# Patient Record
Sex: Female | Born: 1955 | Race: White | Hispanic: No | Marital: Single | Smoking: Former smoker
Health system: Southern US, Community
[De-identification: ages and names within clinical notes are randomized; demographics above are authoritative.]

## PROBLEM LIST (undated history)

## (undated) DIAGNOSIS — E079 Disorder of thyroid, unspecified: Secondary | ICD-10-CM

## (undated) DIAGNOSIS — C4491 Basal cell carcinoma of skin, unspecified: Secondary | ICD-10-CM

## (undated) DIAGNOSIS — Z9189 Other specified personal risk factors, not elsewhere classified: Secondary | ICD-10-CM

## (undated) DIAGNOSIS — C50919 Malignant neoplasm of unspecified site of unspecified female breast: Secondary | ICD-10-CM

## (undated) DIAGNOSIS — R897 Abnormal histological findings in specimens from other organs, systems and tissues: Secondary | ICD-10-CM

## (undated) DIAGNOSIS — B977 Papillomavirus as the cause of diseases classified elsewhere: Secondary | ICD-10-CM

## (undated) DIAGNOSIS — F32A Depression, unspecified: Secondary | ICD-10-CM

## (undated) DIAGNOSIS — F329 Major depressive disorder, single episode, unspecified: Secondary | ICD-10-CM

## (undated) DIAGNOSIS — Z923 Personal history of irradiation: Secondary | ICD-10-CM

## (undated) DIAGNOSIS — Z9221 Personal history of antineoplastic chemotherapy: Secondary | ICD-10-CM

## (undated) DIAGNOSIS — Z87410 Personal history of cervical dysplasia: Secondary | ICD-10-CM

## (undated) HISTORY — DX: Major depressive disorder, single episode, unspecified: F32.9

## (undated) HISTORY — DX: Papillomavirus as the cause of diseases classified elsewhere: B97.7

## (undated) HISTORY — DX: Abnormal histological findings in specimens from other organs, systems and tissues: R89.7

## (undated) HISTORY — DX: Personal history of cervical dysplasia: Z87.410

## (undated) HISTORY — DX: Basal cell carcinoma of skin, unspecified: C44.91

## (undated) HISTORY — PX: BREAST BIOPSY: SHX20

## (undated) HISTORY — DX: Disorder of thyroid, unspecified: E07.9

## (undated) HISTORY — PX: NASAL SINUS SURGERY: SHX719

## (undated) HISTORY — DX: Malignant neoplasm of unspecified site of unspecified female breast: C50.919

## (undated) HISTORY — PX: MASTECTOMY: SHX3

## (undated) HISTORY — DX: Other specified personal risk factors, not elsewhere classified: Z91.89

## (undated) HISTORY — DX: Depression, unspecified: F32.A

## (undated) HISTORY — PX: BREAST SURGERY: SHX581

---

## 1999-03-26 ENCOUNTER — Other Ambulatory Visit: Admission: RE | Admit: 1999-03-26 | Discharge: 1999-03-26 | Payer: Self-pay | Admitting: *Deleted

## 1999-12-02 ENCOUNTER — Encounter: Admission: RE | Admit: 1999-12-02 | Discharge: 1999-12-02 | Payer: Self-pay | Admitting: *Deleted

## 1999-12-02 ENCOUNTER — Encounter: Payer: Self-pay | Admitting: *Deleted

## 2003-05-27 ENCOUNTER — Other Ambulatory Visit: Admission: RE | Admit: 2003-05-27 | Discharge: 2003-05-27 | Payer: Self-pay | Admitting: Internal Medicine

## 2003-06-06 ENCOUNTER — Encounter: Admission: RE | Admit: 2003-06-06 | Discharge: 2003-06-06 | Payer: Self-pay | Admitting: Internal Medicine

## 2004-06-07 ENCOUNTER — Encounter: Admission: RE | Admit: 2004-06-07 | Discharge: 2004-06-07 | Payer: Self-pay | Admitting: Obstetrics and Gynecology

## 2004-06-23 ENCOUNTER — Ambulatory Visit: Payer: Self-pay | Admitting: Internal Medicine

## 2004-07-13 ENCOUNTER — Ambulatory Visit: Payer: Self-pay | Admitting: Internal Medicine

## 2004-12-15 ENCOUNTER — Ambulatory Visit (HOSPITAL_BASED_OUTPATIENT_CLINIC_OR_DEPARTMENT_OTHER): Admission: RE | Admit: 2004-12-15 | Discharge: 2004-12-15 | Payer: Self-pay | Admitting: Plastic Surgery

## 2004-12-15 ENCOUNTER — Encounter (INDEPENDENT_AMBULATORY_CARE_PROVIDER_SITE_OTHER): Payer: Self-pay | Admitting: Specialist

## 2005-02-21 DIAGNOSIS — C50919 Malignant neoplasm of unspecified site of unspecified female breast: Secondary | ICD-10-CM

## 2005-02-21 HISTORY — PX: MASTECTOMY: SHX3

## 2005-02-21 HISTORY — PX: BREAST BIOPSY: SHX20

## 2005-02-21 HISTORY — DX: Malignant neoplasm of unspecified site of unspecified female breast: C50.919

## 2005-06-09 ENCOUNTER — Encounter: Admission: RE | Admit: 2005-06-09 | Discharge: 2005-06-09 | Payer: Self-pay | Admitting: Obstetrics and Gynecology

## 2005-10-31 ENCOUNTER — Encounter: Admission: RE | Admit: 2005-10-31 | Discharge: 2005-10-31 | Payer: Self-pay | Admitting: Obstetrics and Gynecology

## 2005-10-31 ENCOUNTER — Encounter (INDEPENDENT_AMBULATORY_CARE_PROVIDER_SITE_OTHER): Payer: Self-pay | Admitting: Diagnostic Radiology

## 2005-11-10 ENCOUNTER — Encounter: Admission: RE | Admit: 2005-11-10 | Discharge: 2005-11-10 | Payer: Self-pay | Admitting: Obstetrics and Gynecology

## 2005-11-14 ENCOUNTER — Encounter (INDEPENDENT_AMBULATORY_CARE_PROVIDER_SITE_OTHER): Payer: Self-pay | Admitting: *Deleted

## 2005-11-14 ENCOUNTER — Encounter: Admission: RE | Admit: 2005-11-14 | Discharge: 2005-11-14 | Payer: Self-pay | Admitting: Obstetrics and Gynecology

## 2005-11-18 ENCOUNTER — Ambulatory Visit: Payer: Self-pay | Admitting: Oncology

## 2005-11-24 ENCOUNTER — Ambulatory Visit (HOSPITAL_COMMUNITY): Admission: RE | Admit: 2005-11-24 | Discharge: 2005-11-24 | Payer: Self-pay | Admitting: Oncology

## 2005-11-29 ENCOUNTER — Ambulatory Visit (HOSPITAL_COMMUNITY): Admission: RE | Admit: 2005-11-29 | Discharge: 2005-11-29 | Payer: Self-pay | Admitting: Oncology

## 2005-12-20 LAB — CBC WITH DIFFERENTIAL/PLATELET
EOS%: 1.4 % (ref 0.0–7.0)
Eosinophils Absolute: 0.1 10*3/uL (ref 0.0–0.5)
LYMPH%: 17.3 % (ref 14.0–48.0)
MCH: 32.5 pg (ref 26.0–34.0)
MCHC: 34.3 g/dL (ref 32.0–36.0)
MCV: 94.8 fL (ref 81.0–101.0)
MONO%: 2.8 % (ref 0.0–13.0)
NEUT#: 6.5 10*3/uL (ref 1.5–6.5)
Platelets: 155 10*3/uL (ref 145–400)
RBC: 3.54 10*6/uL — ABNORMAL LOW (ref 3.70–5.32)
RDW: 12.5 % (ref 11.3–14.5)

## 2005-12-28 ENCOUNTER — Ambulatory Visit: Payer: Self-pay | Admitting: Oncology

## 2005-12-28 LAB — CBC WITH DIFFERENTIAL/PLATELET
BASO%: 0.2 % (ref 0.0–2.0)
LYMPH%: 14.5 % (ref 14.0–48.0)
MCHC: 34.4 g/dL (ref 32.0–36.0)
MONO#: 0.7 10*3/uL (ref 0.1–0.9)
MONO%: 4 % (ref 0.0–13.0)
Platelets: 203 10*3/uL (ref 145–400)
RBC: 3.64 10*6/uL — ABNORMAL LOW (ref 3.70–5.32)
RDW: 12.6 % (ref 11.3–14.5)
WBC: 18.1 10*3/uL — ABNORMAL HIGH (ref 3.9–10.0)

## 2006-01-02 LAB — CBC WITH DIFFERENTIAL/PLATELET
BASO%: 0.5 % (ref 0.0–2.0)
MCHC: 35 g/dL (ref 32.0–36.0)
MONO#: 0.5 10*3/uL (ref 0.1–0.9)
RBC: 3.59 10*6/uL — ABNORMAL LOW (ref 3.70–5.32)
WBC: 7 10*3/uL (ref 3.9–10.0)
lymph#: 1.5 10*3/uL (ref 0.9–3.3)

## 2006-01-03 ENCOUNTER — Ambulatory Visit (HOSPITAL_COMMUNITY): Admission: RE | Admit: 2006-01-03 | Discharge: 2006-01-03 | Payer: Self-pay | Admitting: General Surgery

## 2006-01-03 LAB — PROTHROMBIN TIME
INR: 1.2 (ref 0.0–1.5)
Prothrombin Time: 15.1 seconds (ref 11.6–15.2)

## 2006-01-03 LAB — APTT: aPTT: 36 seconds (ref 24–37)

## 2006-01-10 LAB — CBC WITH DIFFERENTIAL/PLATELET
BASO%: 1.4 % (ref 0.0–2.0)
LYMPH%: 16.2 % (ref 14.0–48.0)
MCHC: 34.6 g/dL (ref 32.0–36.0)
MONO#: 0.8 10*3/uL (ref 0.1–0.9)
RBC: 3.33 10*6/uL — ABNORMAL LOW (ref 3.70–5.32)
WBC: 10.1 10*3/uL — ABNORMAL HIGH (ref 3.9–10.0)
lymph#: 1.6 10*3/uL (ref 0.9–3.3)

## 2006-01-24 LAB — CBC WITH DIFFERENTIAL/PLATELET
BASO%: 0.3 % (ref 0.0–2.0)
HCT: 32.9 % — ABNORMAL LOW (ref 34.8–46.6)
LYMPH%: 16.8 % (ref 14.0–48.0)
MCH: 32.8 pg (ref 26.0–34.0)
MCHC: 34.6 g/dL (ref 32.0–36.0)
MCV: 94.7 fL (ref 81.0–101.0)
MONO#: 0.5 10*3/uL (ref 0.1–0.9)
MONO%: 7.2 % (ref 0.0–13.0)
NEUT%: 75.5 % (ref 39.6–76.8)
Platelets: 280 10*3/uL (ref 145–400)
WBC: 7.6 10*3/uL (ref 3.9–10.0)

## 2006-02-01 LAB — CBC WITH DIFFERENTIAL/PLATELET
BASO%: 0 % (ref 0.0–2.0)
EOS%: 0.2 % (ref 0.0–7.0)
HCT: 31.1 % — ABNORMAL LOW (ref 34.8–46.6)
LYMPH%: 7.6 % — ABNORMAL LOW (ref 14.0–48.0)
MCH: 32.7 pg (ref 26.0–34.0)
MCHC: 34.3 g/dL (ref 32.0–36.0)
NEUT%: 89.8 % — ABNORMAL HIGH (ref 39.6–76.8)
lymph#: 2.3 10*3/uL (ref 0.9–3.3)

## 2006-02-08 ENCOUNTER — Ambulatory Visit: Payer: Self-pay | Admitting: Oncology

## 2006-02-13 LAB — CBC WITH DIFFERENTIAL/PLATELET
EOS%: 0.1 % (ref 0.0–7.0)
Eosinophils Absolute: 0 10*3/uL (ref 0.0–0.5)
LYMPH%: 9.6 % — ABNORMAL LOW (ref 14.0–48.0)
MCH: 32.3 pg (ref 26.0–34.0)
MCHC: 34.1 g/dL (ref 32.0–36.0)
MCV: 94.6 fL (ref 81.0–101.0)
MONO%: 6.5 % (ref 0.0–13.0)
NEUT#: 11.2 10*3/uL — ABNORMAL HIGH (ref 1.5–6.5)
Platelets: 270 10*3/uL (ref 145–400)
RBC: 3.39 10*6/uL — ABNORMAL LOW (ref 3.70–5.32)

## 2006-02-23 ENCOUNTER — Encounter: Admission: RE | Admit: 2006-02-23 | Discharge: 2006-02-23 | Payer: Self-pay | Admitting: Oncology

## 2006-02-27 LAB — CBC WITH DIFFERENTIAL/PLATELET
BASO%: 0.1 % (ref 0.0–2.0)
LYMPH%: 8.3 % — ABNORMAL LOW (ref 14.0–48.0)
MCHC: 33.8 g/dL (ref 32.0–36.0)
MONO#: 0.9 10*3/uL (ref 0.1–0.9)
MONO%: 3.7 % (ref 0.0–13.0)
Platelets: 241 10*3/uL (ref 145–400)
RBC: 3.55 10*6/uL — ABNORMAL LOW (ref 3.70–5.32)
RDW: 14.1 % (ref 11.3–14.5)
WBC: 25.5 10*3/uL — ABNORMAL HIGH (ref 3.9–10.0)

## 2006-03-07 LAB — CBC WITH DIFFERENTIAL/PLATELET
BASO%: 0.1 % (ref 0.0–2.0)
HCT: 32.9 % — ABNORMAL LOW (ref 34.8–46.6)
MCHC: 33.8 g/dL (ref 32.0–36.0)
MONO#: 0.9 10*3/uL (ref 0.1–0.9)
NEUT#: 9.9 10*3/uL — ABNORMAL HIGH (ref 1.5–6.5)
RBC: 3.49 10*6/uL — ABNORMAL LOW (ref 3.70–5.32)
WBC: 12.8 10*3/uL — ABNORMAL HIGH (ref 3.9–10.0)
lymph#: 1.9 10*3/uL (ref 0.9–3.3)

## 2006-03-15 LAB — CBC WITH DIFFERENTIAL/PLATELET
BASO%: 0.4 % (ref 0.0–2.0)
Basophils Absolute: 0 10*3/uL (ref 0.0–0.1)
Eosinophils Absolute: 0.1 10*3/uL (ref 0.0–0.5)
HCT: 30.7 % — ABNORMAL LOW (ref 34.8–46.6)
HGB: 10.4 g/dL — ABNORMAL LOW (ref 11.6–15.9)
MONO#: 0.1 10*3/uL (ref 0.1–0.9)
NEUT%: 70.7 % (ref 39.6–76.8)
WBC: 3.9 10*3/uL (ref 3.9–10.0)
lymph#: 0.9 10*3/uL (ref 0.9–3.3)

## 2006-03-21 ENCOUNTER — Ambulatory Visit: Payer: Self-pay | Admitting: Oncology

## 2006-03-21 LAB — CBC WITH DIFFERENTIAL/PLATELET
Basophils Absolute: 0.1 10*3/uL (ref 0.0–0.1)
EOS%: 1.1 % (ref 0.0–7.0)
HCT: 35.7 % (ref 34.8–46.6)
HGB: 12 g/dL (ref 11.6–15.9)
MCH: 31.7 pg (ref 26.0–34.0)
MCV: 94.4 fL (ref 81.0–101.0)
NEUT%: 75.8 % (ref 39.6–76.8)
lymph#: 1.9 10*3/uL (ref 0.9–3.3)

## 2006-03-29 LAB — CBC WITH DIFFERENTIAL/PLATELET
BASO%: 0.7 % (ref 0.0–2.0)
LYMPH%: 16.8 % (ref 14.0–48.0)
MCH: 32.5 pg (ref 26.0–34.0)
MCHC: 35 g/dL (ref 32.0–36.0)
MCV: 93.1 fL (ref 81.0–101.0)
MONO%: 3.2 % (ref 0.0–13.0)
NEUT%: 79.2 % — ABNORMAL HIGH (ref 39.6–76.8)
Platelets: 156 10*3/uL (ref 145–400)
RBC: 3.02 10*6/uL — ABNORMAL LOW (ref 3.70–5.32)

## 2006-04-04 LAB — CBC WITH DIFFERENTIAL/PLATELET
BASO%: 0.2 % (ref 0.0–2.0)
EOS%: 0.1 % (ref 0.0–7.0)
LYMPH%: 11.1 % — ABNORMAL LOW (ref 14.0–48.0)
MCH: 32.5 pg (ref 26.0–34.0)
MCHC: 35.1 g/dL (ref 32.0–36.0)
MONO#: 0.8 10*3/uL (ref 0.1–0.9)
RBC: 3.49 10*6/uL — ABNORMAL LOW (ref 3.70–5.32)
WBC: 16.4 10*3/uL — ABNORMAL HIGH (ref 3.9–10.0)
lymph#: 1.8 10*3/uL (ref 0.9–3.3)

## 2006-04-10 ENCOUNTER — Encounter: Admission: RE | Admit: 2006-04-10 | Discharge: 2006-04-10 | Payer: Self-pay | Admitting: Oncology

## 2006-04-12 LAB — CBC WITH DIFFERENTIAL/PLATELET
BASO%: 0.5 % (ref 0.0–2.0)
EOS%: 0.1 % (ref 0.0–7.0)
HCT: 28.4 % — ABNORMAL LOW (ref 34.8–46.6)
HGB: 10.2 g/dL — ABNORMAL LOW (ref 11.6–15.9)
MCHC: 35.9 g/dL (ref 32.0–36.0)
MONO#: 0.3 10*3/uL (ref 0.1–0.9)
NEUT%: 85.5 % — ABNORMAL HIGH (ref 39.6–76.8)
RDW: 13 % (ref 11.3–14.5)
WBC: 9.2 10*3/uL (ref 3.9–10.0)
lymph#: 1 10*3/uL (ref 0.9–3.3)

## 2006-04-18 LAB — CBC WITH DIFFERENTIAL/PLATELET
Basophils Absolute: 0 10*3/uL (ref 0.0–0.1)
EOS%: 0.1 % (ref 0.0–7.0)
Eosinophils Absolute: 0 10*3/uL (ref 0.0–0.5)
HCT: 34.7 % — ABNORMAL LOW (ref 34.8–46.6)
HGB: 11.9 g/dL (ref 11.6–15.9)
MCH: 32.1 pg (ref 26.0–34.0)
MONO#: 1 10*3/uL — ABNORMAL HIGH (ref 0.1–0.9)
NEUT#: 14.2 10*3/uL — ABNORMAL HIGH (ref 1.5–6.5)
NEUT%: 84.7 % — ABNORMAL HIGH (ref 39.6–76.8)
RDW: 16.8 % — ABNORMAL HIGH (ref 11.3–14.5)
lymph#: 1.6 10*3/uL (ref 0.9–3.3)

## 2006-04-26 LAB — CBC WITH DIFFERENTIAL/PLATELET
Basophils Absolute: 0 10*3/uL (ref 0.0–0.1)
EOS%: 0.1 % (ref 0.0–7.0)
Eosinophils Absolute: 0 10*3/uL (ref 0.0–0.5)
HCT: 30.6 % — ABNORMAL LOW (ref 34.8–46.6)
HGB: 10.6 g/dL — ABNORMAL LOW (ref 11.6–15.9)
MCH: 31.9 pg (ref 26.0–34.0)
MCV: 91.9 fL (ref 81.0–101.0)
MONO%: 2.2 % (ref 0.0–13.0)
NEUT#: 5.6 10*3/uL (ref 1.5–6.5)
NEUT%: 87.2 % — ABNORMAL HIGH (ref 39.6–76.8)
Platelets: 133 10*3/uL — ABNORMAL LOW (ref 145–400)
RDW: 16.1 % — ABNORMAL HIGH (ref 11.3–14.5)

## 2006-05-22 ENCOUNTER — Encounter (INDEPENDENT_AMBULATORY_CARE_PROVIDER_SITE_OTHER): Payer: Self-pay | Admitting: Specialist

## 2006-05-22 ENCOUNTER — Ambulatory Visit (HOSPITAL_COMMUNITY): Admission: RE | Admit: 2006-05-22 | Discharge: 2006-05-23 | Payer: Self-pay | Admitting: General Surgery

## 2006-05-25 ENCOUNTER — Ambulatory Visit: Payer: Self-pay | Admitting: Oncology

## 2006-05-30 LAB — COMPREHENSIVE METABOLIC PANEL
ALT: 37 U/L — ABNORMAL HIGH (ref 0–35)
AST: 34 U/L (ref 0–37)
Calcium: 9.2 mg/dL (ref 8.4–10.5)
Chloride: 104 mEq/L (ref 96–112)
Creatinine, Ser: 0.56 mg/dL (ref 0.40–1.20)
Potassium: 4.2 mEq/L (ref 3.5–5.3)
Sodium: 141 mEq/L (ref 135–145)
Total Protein: 6.4 g/dL (ref 6.0–8.3)

## 2006-05-30 LAB — CBC WITH DIFFERENTIAL/PLATELET
Basophils Absolute: 0 10*3/uL (ref 0.0–0.1)
EOS%: 2.9 % (ref 0.0–7.0)
Eosinophils Absolute: 0.1 10*3/uL (ref 0.0–0.5)
LYMPH%: 21.2 % (ref 14.0–48.0)
MCH: 32.4 pg (ref 26.0–34.0)
MCV: 91.4 fL (ref 81.0–101.0)
MONO%: 7.9 % (ref 0.0–13.0)
NEUT#: 3 10*3/uL (ref 1.5–6.5)
Platelets: 168 10*3/uL (ref 145–400)
RBC: 3.89 10*6/uL (ref 3.70–5.32)
RDW: 15.3 % — ABNORMAL HIGH (ref 11.3–14.5)

## 2006-06-09 LAB — ESTRADIOL, ULTRA SENS: Estradiol, Ultra Sensitive: <2 pg/mL

## 2006-06-13 ENCOUNTER — Ambulatory Visit: Admission: RE | Admit: 2006-06-13 | Discharge: 2006-08-18 | Payer: Self-pay | Admitting: Radiation Oncology

## 2006-07-25 ENCOUNTER — Ambulatory Visit: Payer: Self-pay | Admitting: Oncology

## 2006-10-15 ENCOUNTER — Ambulatory Visit: Payer: Self-pay | Admitting: Oncology

## 2006-11-18 ENCOUNTER — Ambulatory Visit (HOSPITAL_COMMUNITY): Admission: RE | Admit: 2006-11-18 | Discharge: 2006-11-18 | Payer: Self-pay | Admitting: Family Medicine

## 2006-11-18 ENCOUNTER — Ambulatory Visit: Payer: Self-pay | Admitting: Family Medicine

## 2006-12-06 ENCOUNTER — Ambulatory Visit: Payer: Self-pay | Admitting: Oncology

## 2007-01-02 ENCOUNTER — Encounter: Payer: Self-pay | Admitting: Internal Medicine

## 2007-01-05 LAB — ESTRADIOL, ULTRA SENS: Estradiol, Ultra Sensitive: 6 pg/mL

## 2007-01-11 ENCOUNTER — Encounter: Payer: Self-pay | Admitting: Internal Medicine

## 2007-01-22 ENCOUNTER — Encounter: Admission: RE | Admit: 2007-01-22 | Discharge: 2007-01-22 | Payer: Self-pay | Admitting: Oncology

## 2007-02-27 ENCOUNTER — Encounter: Admission: RE | Admit: 2007-02-27 | Discharge: 2007-02-27 | Payer: Self-pay | Admitting: Oncology

## 2007-03-30 ENCOUNTER — Ambulatory Visit: Payer: Self-pay | Admitting: Oncology

## 2007-04-03 LAB — CBC WITH DIFFERENTIAL/PLATELET
Basophils Absolute: 0 10*3/uL (ref 0.0–0.1)
Eosinophils Absolute: 0.1 10*3/uL (ref 0.0–0.5)
HGB: 12.8 g/dL (ref 11.6–15.9)
MONO#: 0.4 10*3/uL (ref 0.1–0.9)
NEUT#: 4 10*3/uL (ref 1.5–6.5)
RBC: 3.93 10*6/uL (ref 3.70–5.32)
RDW: 12.1 % (ref 11.3–14.5)
WBC: 5.2 10*3/uL (ref 3.9–10.0)
lymph#: 0.7 10*3/uL — ABNORMAL LOW (ref 0.9–3.3)

## 2007-04-04 LAB — COMPREHENSIVE METABOLIC PANEL
AST: 23 U/L (ref 0–37)
Albumin: 4.4 g/dL (ref 3.5–5.2)
BUN: 19 mg/dL (ref 6–23)
Calcium: 9.6 mg/dL (ref 8.4–10.5)
Chloride: 103 mEq/L (ref 96–112)
Glucose, Bld: 84 mg/dL (ref 70–99)
Potassium: 4.5 mEq/L (ref 3.5–5.3)
Sodium: 140 mEq/L (ref 135–145)
Total Protein: 6.6 g/dL (ref 6.0–8.3)

## 2007-04-10 ENCOUNTER — Encounter: Payer: Self-pay | Admitting: Internal Medicine

## 2007-04-13 ENCOUNTER — Encounter: Admission: RE | Admit: 2007-04-13 | Discharge: 2007-04-13 | Payer: Self-pay | Admitting: Oncology

## 2007-04-19 ENCOUNTER — Encounter: Payer: Self-pay | Admitting: *Deleted

## 2007-04-19 DIAGNOSIS — Z87410 Personal history of cervical dysplasia: Secondary | ICD-10-CM | POA: Insufficient documentation

## 2007-04-19 DIAGNOSIS — Z9189 Other specified personal risk factors, not elsewhere classified: Secondary | ICD-10-CM | POA: Insufficient documentation

## 2007-04-19 DIAGNOSIS — Z9010 Acquired absence of unspecified breast and nipple: Secondary | ICD-10-CM | POA: Insufficient documentation

## 2007-04-19 DIAGNOSIS — B977 Papillomavirus as the cause of diseases classified elsewhere: Secondary | ICD-10-CM | POA: Insufficient documentation

## 2007-04-19 DIAGNOSIS — C50919 Malignant neoplasm of unspecified site of unspecified female breast: Secondary | ICD-10-CM | POA: Insufficient documentation

## 2007-04-20 ENCOUNTER — Ambulatory Visit: Payer: Self-pay | Admitting: Internal Medicine

## 2007-04-20 DIAGNOSIS — R5381 Other malaise: Secondary | ICD-10-CM | POA: Insufficient documentation

## 2007-04-20 DIAGNOSIS — R5383 Other fatigue: Secondary | ICD-10-CM

## 2007-05-30 ENCOUNTER — Telehealth: Payer: Self-pay | Admitting: Internal Medicine

## 2007-05-31 ENCOUNTER — Ambulatory Visit: Payer: Self-pay | Admitting: Internal Medicine

## 2007-05-31 LAB — CONVERTED CEMR LAB
Anti Nuclear Antibody(ANA): NEGATIVE
BUN: 13 mg/dL (ref 6–23)
Creatinine, Ser: 0.6 mg/dL (ref 0.4–1.2)
GFR calc Af Amer: 137 mL/min
GFR calc non Af Amer: 113 mL/min
Potassium: 4.4 meq/L (ref 3.5–5.1)

## 2007-06-01 ENCOUNTER — Encounter: Payer: Self-pay | Admitting: Internal Medicine

## 2007-06-04 ENCOUNTER — Ambulatory Visit: Payer: Self-pay | Admitting: Internal Medicine

## 2007-06-28 ENCOUNTER — Ambulatory Visit: Payer: Self-pay | Admitting: Oncology

## 2007-07-02 LAB — COMPREHENSIVE METABOLIC PANEL
AST: 20 U/L (ref 0–37)
BUN: 17 mg/dL (ref 6–23)
CO2: 28 mEq/L (ref 19–32)
Calcium: 9.7 mg/dL (ref 8.4–10.5)
Chloride: 103 mEq/L (ref 96–112)
Creatinine, Ser: 0.67 mg/dL (ref 0.40–1.20)
Glucose, Bld: 69 mg/dL — ABNORMAL LOW (ref 70–99)

## 2007-07-02 LAB — CANCER ANTIGEN 27.29: CA 27.29: 19 U/mL (ref 0–39)

## 2007-07-02 LAB — CBC WITH DIFFERENTIAL/PLATELET
Basophils Absolute: 0 10*3/uL (ref 0.0–0.1)
EOS%: 2.9 % (ref 0.0–7.0)
Eosinophils Absolute: 0.1 10*3/uL (ref 0.0–0.5)
HCT: 37.4 % (ref 34.8–46.6)
HGB: 13.3 g/dL (ref 11.6–15.9)
MCH: 32.5 pg (ref 26.0–34.0)
NEUT#: 3.4 10*3/uL (ref 1.5–6.5)
NEUT%: 70.5 % (ref 39.6–76.8)
lymph#: 0.9 10*3/uL (ref 0.9–3.3)

## 2007-07-09 ENCOUNTER — Encounter: Payer: Self-pay | Admitting: Internal Medicine

## 2007-08-14 ENCOUNTER — Ambulatory Visit: Payer: Self-pay | Admitting: Oncology

## 2007-08-14 ENCOUNTER — Encounter: Payer: Self-pay | Admitting: Internal Medicine

## 2007-09-21 ENCOUNTER — Ambulatory Visit: Payer: Self-pay | Admitting: Internal Medicine

## 2007-09-21 ENCOUNTER — Telehealth: Payer: Self-pay | Admitting: Internal Medicine

## 2007-09-21 DIAGNOSIS — R079 Chest pain, unspecified: Secondary | ICD-10-CM | POA: Insufficient documentation

## 2007-10-02 ENCOUNTER — Ambulatory Visit: Payer: Self-pay | Admitting: Oncology

## 2007-10-08 ENCOUNTER — Encounter: Payer: Self-pay | Admitting: Internal Medicine

## 2007-12-20 ENCOUNTER — Encounter: Payer: Self-pay | Admitting: Internal Medicine

## 2008-02-28 ENCOUNTER — Encounter: Admission: RE | Admit: 2008-02-28 | Discharge: 2008-02-28 | Payer: Self-pay | Admitting: Oncology

## 2008-04-08 ENCOUNTER — Ambulatory Visit: Payer: Self-pay | Admitting: Oncology

## 2008-04-10 ENCOUNTER — Ambulatory Visit (HOSPITAL_COMMUNITY): Admission: RE | Admit: 2008-04-10 | Discharge: 2008-04-10 | Payer: Self-pay | Admitting: Oncology

## 2008-04-10 LAB — COMPREHENSIVE METABOLIC PANEL
ALT: 19 U/L (ref 0–35)
BUN: 13 mg/dL (ref 6–23)
CO2: 28 mEq/L (ref 19–32)
Calcium: 9.4 mg/dL (ref 8.4–10.5)
Chloride: 108 mEq/L (ref 96–112)
Creatinine, Ser: 0.51 mg/dL (ref 0.40–1.20)

## 2008-04-10 LAB — CBC WITH DIFFERENTIAL/PLATELET
BASO%: 0.5 % (ref 0.0–2.0)
Eosinophils Absolute: 0.3 10*3/uL (ref 0.0–0.5)
MCHC: 34.5 g/dL (ref 32.0–36.0)
MONO#: 0.3 10*3/uL (ref 0.1–0.9)
NEUT#: 1.9 10*3/uL (ref 1.5–6.5)
Platelets: 154 10*3/uL (ref 145–400)
RBC: 4.24 10*6/uL (ref 3.70–5.32)
RDW: 12.5 % (ref 11.3–14.5)
WBC: 3.6 10*3/uL — ABNORMAL LOW (ref 3.9–10.0)
lymph#: 1.1 10*3/uL (ref 0.9–3.3)

## 2008-04-10 LAB — FOLLICLE STIMULATING HORMONE: FSH: 62.5 m[IU]/mL

## 2008-04-10 LAB — CANCER ANTIGEN 27.29: CA 27.29: 18 U/mL (ref 0–39)

## 2008-04-17 ENCOUNTER — Encounter: Payer: Self-pay | Admitting: Internal Medicine

## 2008-04-22 LAB — ESTRADIOL, ULTRA SENS

## 2008-12-23 ENCOUNTER — Ambulatory Visit: Payer: Self-pay | Admitting: Oncology

## 2008-12-25 LAB — CBC WITH DIFFERENTIAL/PLATELET
Eosinophils Absolute: 0.4 10*3/uL (ref 0.0–0.5)
HCT: 36.5 % (ref 34.8–46.6)
LYMPH%: 23.3 % (ref 14.0–49.7)
MCHC: 34.8 g/dL (ref 31.5–36.0)
MCV: 95 fL (ref 79.5–101.0)
MONO#: 0.4 10*3/uL (ref 0.1–0.9)
MONO%: 7.3 % (ref 0.0–14.0)
NEUT#: 3.5 10*3/uL (ref 1.5–6.5)
NEUT%: 61.8 % (ref 38.4–76.8)
Platelets: 181 10*3/uL (ref 145–400)
WBC: 5.6 10*3/uL (ref 3.9–10.3)

## 2008-12-26 LAB — COMPREHENSIVE METABOLIC PANEL
Alkaline Phosphatase: 50 U/L (ref 39–117)
BUN: 21 mg/dL (ref 6–23)
CO2: 28 mEq/L (ref 19–32)
Creatinine, Ser: 0.64 mg/dL (ref 0.40–1.20)
Glucose, Bld: 76 mg/dL (ref 70–99)
Total Bilirubin: 0.4 mg/dL (ref 0.3–1.2)

## 2008-12-26 LAB — VITAMIN D 25 HYDROXY (VIT D DEFICIENCY, FRACTURES): Vit D, 25-Hydroxy: 39 ng/mL (ref 30–89)

## 2008-12-26 LAB — LUTEINIZING HORMONE: LH: 32.3 m[IU]/mL

## 2009-01-01 ENCOUNTER — Encounter: Payer: Self-pay | Admitting: Internal Medicine

## 2009-01-05 ENCOUNTER — Ambulatory Visit (HOSPITAL_COMMUNITY): Admission: RE | Admit: 2009-01-05 | Discharge: 2009-01-05 | Payer: Self-pay | Admitting: Oncology

## 2009-01-05 LAB — ESTRADIOL, ULTRA SENS: Estradiol, Ultra Sensitive: <2 pg/mL

## 2009-01-22 ENCOUNTER — Encounter: Admission: RE | Admit: 2009-01-22 | Discharge: 2009-01-22 | Payer: Self-pay | Admitting: Oncology

## 2009-03-02 ENCOUNTER — Encounter: Admission: RE | Admit: 2009-03-02 | Discharge: 2009-03-02 | Payer: Self-pay | Admitting: Oncology

## 2009-07-06 ENCOUNTER — Ambulatory Visit: Payer: Self-pay | Admitting: Internal Medicine

## 2009-07-06 LAB — CONVERTED CEMR LAB
AST: 27 units/L (ref 0–37)
Albumin: 3.9 g/dL (ref 3.5–5.2)
Alkaline Phosphatase: 41 units/L (ref 39–117)
BUN: 24 mg/dL — ABNORMAL HIGH (ref 6–23)
Basophils Absolute: 0 10*3/uL (ref 0.0–0.1)
Basophils Relative: 0.4 % (ref 0.0–3.0)
Bilirubin, Direct: 0.1 mg/dL (ref 0.0–0.3)
CO2: 31 meq/L (ref 19–32)
Chloride: 106 meq/L (ref 96–112)
Creatinine, Ser: 0.5 mg/dL (ref 0.4–1.2)
GFR calc non Af Amer: 155.79 mL/min (ref >60)
Glucose, Bld: 79 mg/dL (ref 70–99)
HCT: 36 % (ref 36.0–46.0)
Hemoglobin: 12.6 g/dL (ref 12.0–15.0)
Ketones, ur: NEGATIVE mg/dL
LDL Cholesterol: 94 mg/dL (ref 0–99)
Lymphocytes Relative: 30.4 % (ref 12.0–46.0)
Monocytes Relative: 10.3 % (ref 3.0–12.0)
Neutro Abs: 2.2 10*3/uL (ref 1.4–7.7)
Nitrite: NEGATIVE
Platelets: 162 10*3/uL (ref 150.0–400.0)
RBC: 3.82 M/uL — ABNORMAL LOW (ref 3.87–5.11)
Specific Gravity, Urine: 1.02 (ref 1.000–1.030)
Total CHOL/HDL Ratio: 3
Total Protein, Urine: NEGATIVE mg/dL
Total Protein: 6.1 g/dL (ref 6.0–8.3)
Triglycerides: 46 mg/dL (ref 0.0–149.0)
Urine Glucose: NEGATIVE mg/dL
Urobilinogen, UA: 0.2 (ref 0.0–1.0)
WBC: 4.3 10*3/uL — ABNORMAL LOW (ref 4.5–10.5)
pH: 6 (ref 5.0–8.0)

## 2009-07-10 ENCOUNTER — Ambulatory Visit: Payer: Self-pay | Admitting: Internal Medicine

## 2009-07-16 ENCOUNTER — Ambulatory Visit: Payer: Self-pay | Admitting: Internal Medicine

## 2009-07-16 ENCOUNTER — Telehealth: Payer: Self-pay | Admitting: Internal Medicine

## 2009-07-21 ENCOUNTER — Encounter: Payer: Self-pay | Admitting: Internal Medicine

## 2009-08-03 ENCOUNTER — Encounter: Admission: RE | Admit: 2009-08-03 | Discharge: 2009-08-03 | Payer: Self-pay | Admitting: General Surgery

## 2009-09-15 ENCOUNTER — Encounter (INDEPENDENT_AMBULATORY_CARE_PROVIDER_SITE_OTHER): Payer: Self-pay | Admitting: *Deleted

## 2009-10-23 ENCOUNTER — Encounter (INDEPENDENT_AMBULATORY_CARE_PROVIDER_SITE_OTHER): Payer: Self-pay

## 2009-10-27 ENCOUNTER — Ambulatory Visit: Payer: Self-pay | Admitting: Internal Medicine

## 2009-11-10 ENCOUNTER — Ambulatory Visit: Payer: Self-pay | Admitting: Internal Medicine

## 2009-11-10 HISTORY — PX: COLONOSCOPY: SHX174

## 2009-12-25 ENCOUNTER — Ambulatory Visit: Payer: Self-pay | Admitting: Oncology

## 2009-12-29 LAB — CBC WITH DIFFERENTIAL/PLATELET
Basophils Absolute: 0 10*3/uL (ref 0.0–0.1)
Eosinophils Absolute: 0.2 10*3/uL (ref 0.0–0.5)
HCT: 35.6 % (ref 34.8–46.6)
LYMPH%: 29.3 % (ref 14.0–49.7)
MCV: 93.8 fL (ref 79.5–101.0)
MONO%: 6.7 % (ref 0.0–14.0)
NEUT#: 3 10*3/uL (ref 1.5–6.5)
NEUT%: 59.3 % (ref 38.4–76.8)
Platelets: 194 10*3/uL (ref 145–400)
RBC: 3.79 10*6/uL (ref 3.70–5.45)

## 2009-12-30 LAB — COMPREHENSIVE METABOLIC PANEL
Albumin: 4.8 g/dL (ref 3.5–5.2)
Alkaline Phosphatase: 45 U/L (ref 39–117)
BUN: 16 mg/dL (ref 6–23)
Calcium: 9.4 mg/dL (ref 8.4–10.5)
Chloride: 100 mEq/L (ref 96–112)
Creatinine, Ser: 0.58 mg/dL (ref 0.40–1.20)
Glucose, Bld: 100 mg/dL — ABNORMAL HIGH (ref 70–99)
Potassium: 4 mEq/L (ref 3.5–5.3)

## 2009-12-30 LAB — VITAMIN D 25 HYDROXY (VIT D DEFICIENCY, FRACTURES): Vit D, 25-Hydroxy: 50 ng/mL (ref 30–89)

## 2010-01-05 ENCOUNTER — Encounter: Payer: Self-pay | Admitting: Internal Medicine

## 2010-03-13 ENCOUNTER — Encounter: Payer: Self-pay | Admitting: Oncology

## 2010-03-14 ENCOUNTER — Encounter: Payer: Self-pay | Admitting: Oncology

## 2010-03-25 NOTE — Letter (Signed)
Summary: [facility]   Imported By: Lester Atlanta 08/04/2009 11:55:19  _____________________________________________________________________  External Attachment:    Type:   Image     Comment:   External Document

## 2010-03-25 NOTE — Progress Notes (Signed)
Summary: OV TODAY  Phone Note Call from Patient   Summary of Call: Pt c/o increasing low back & pelvic pain w/pain radiating down left leg. Trouble bending and pain when walking. No injuries and no relief w/otc aleve. Scheduled for office visit today. Initial call taken by: Lamar Sprinkles, CMA,  Jul 16, 2009 10:51 AM  Follow-up for Phone Call        seen Follow-up by: Jacques Navy MD,  Jul 16, 2009 6:13 PM

## 2010-03-25 NOTE — Letter (Signed)
Summary: [facility]  [facility]   Imported By: Sherian Rein 02/10/2010 07:28:15  _____________________________________________________________________  External Attachment:    Type:   Image     Comment:   External Document

## 2010-03-25 NOTE — Letter (Signed)
Summary: [facility] Instructions  [facility]  [address] [place], [place] [address]   Phone: [phone]  Fax: [phone]       Kathryn Mcdaniel    1955-07-22    MRN: [id number]        Procedure Day /Date:  Tuesday 11/10/2009     Arrival Time: 9:00 am      Procedure Time: 10:00 am     Location of Procedure:                    _x _  [facility] (4th Floor)                        PREPARATION FOR COLONOSCOPY WITH MOVIPREP   Starting 5 days prior to your procedure Thursday 9/15 do not eat nuts, seeds, popcorn, corn, beans, peas,  salads, or any raw vegetables.  Do not take any fiber supplements (e.g. Metamucil, Citrucel, and Benefiber).  THE DAY BEFORE YOUR PROCEDURE         DATE: Monday 9/19  1.  Drink clear liquids the entire day-NO SOLID FOOD  2.  Do not drink anything colored red or purple.  Avoid juices with pulp.  No orange juice.  3.  Drink at least 64 oz. (8 glasses) of fluid/clear liquids during the day to prevent dehydration and help the prep work efficiently.  CLEAR LIQUIDS INCLUDE: Water Jello Ice Popsicles Tea (sugar ok, no milk/cream) Powdered fruit flavored drinks Coffee (sugar ok, no milk/cream) Gatorade Juice: apple, white grape, white cranberry  Lemonade Clear bullion, consomm, broth Carbonated beverages (any kind) Strained chicken noodle soup Hard Candy                             4.  In the morning, mix first dose of MoviPrep solution:    Empty 1 Pouch A and 1 Pouch B into the disposable container    Add lukewarm drinking water to the top line of the container. Mix to dissolve    Refrigerate (mixed solution should be used within 24 hrs)  5.  Begin drinking the prep at 5:00 p.m. The MoviPrep container is divided by 4 marks.   Every 15 minutes drink the solution down to the next mark (approximately 8 oz) until the full liter is complete.   6.  Follow completed prep with 16 oz of clear liquid of your choice (Nothing  red or purple).  Continue to drink clear liquids until bedtime.  7.  Before going to bed, mix second dose of MoviPrep solution:    Empty 1 Pouch A and 1 Pouch B into the disposable container    Add lukewarm drinking water to the top line of the container. Mix to dissolve    Refrigerate  THE DAY OF YOUR PROCEDURE      DATE: Tuesday 9/20  Beginning at 5:00 a.m. (5 hours before procedure):         1. Every 15 minutes, drink the solution down to the next mark (approx 8 oz) until the full liter is complete.  2. Follow completed prep with 16 oz. of clear liquid of your choice.    3. You may drink clear liquids until 8:00 am (2 HOURS BEFORE PROCEDURE).   MEDICATION INSTRUCTIONS  Unless otherwise instructed, you should take regular prescription medications with a small sip of water   as early as possible the morning of  your procedure.         OTHER INSTRUCTIONS  You will need a responsible adult at least 53 years of age to accompany you and drive you home.   This person must remain in the waiting room during your procedure.  Wear loose fitting clothing that is easily removed.  Leave jewelry and other valuables at home.  However, you may wish to bring a book to read or  an iPod/MP3 player to listen to music as you wait for your procedure to start.  Remove all body piercing jewelry and leave at home.  Total time from sign-in until discharge is approximately 2-3 hours.  You should go home directly after your procedure and rest.  You can resume normal activities the  day after your procedure.  The day of your procedure you should not:   Drive   Make legal decisions   Operate machinery   Drink alcohol   Return to work  You will receive specific instructions about eating, activities and medications before you leave.    The above instructions have been reviewed and explained to me by   Ulis Rias RN  October 27, 2009 1:53 PM     I fully understand and can  verbalize these instructions _____________________________ Date _________

## 2010-03-25 NOTE — Letter (Signed)
Summary: [facility]  [facility]   Imported By: Lanelle Bal 02/24/2009 13:06:21  _____________________________________________________________________  External Attachment:    Type:   Image     Comment:   External Document

## 2010-03-25 NOTE — Miscellaneous (Signed)
Summary: Lec previsit  Clinical Lists Changes  Medications: Added new medication of MOVIPREP 100 GM  SOLR (PEG-KCL-NACL-NASULF-NA ASC-C) As per prep instructions. - Signed Rx of MOVIPREP 100 GM  SOLR (PEG-KCL-NACL-NASULF-NA ASC-C) As per prep instructions.;  #1 x 0;  Signed;  Entered by: Ulis Rias RN;  Authorized by: Iva Boop MD, FACG;  Method used: Electronically to [id number] [address] [place], [place]  [address], Ph: [phone] or [phone], Fax: [phone] Observations: Added new observation of ALLERGY REV: Done (10/27/2009 13:07)    Prescriptions: MOVIPREP 100 GM  SOLR (PEG-KCL-NACL-NASULF-NA ASC-C) As per prep instructions.  #1 x 0   Entered by:   Ulis Rias RN   Authorized by:   Iva Boop MD, [facility]   Signed by:   Ulis Rias RN on 10/27/2009   Method used:   Electronically to        [id number] (retail)       [address]       [place], [place]  [address]       Ph: [phone] or [phone]       Fax: [phone]   RxID:   [id number]

## 2010-03-25 NOTE — Assessment & Plan Note (Signed)
Summary: PHYSICAL  BCBS  STC   Vital Signs:  Patient profile:   55 year old female Height:      67 inches Weight:      116 pounds BMI:     18.23 O2 Sat:      99 % on Room air Temp:     98.2 degrees F oral Pulse rate:   68 / minute BP sitting:   108 / 62  (left arm) Cuff size:   regular  Vitals Entered By: Bill Salinas CMA (Jul 10, 2009 10:10 AM)  O2 Flow:  Room air CC: pt here for cpx, she had never had a colonoscopy and has yearly paps done with Dr Billy Coast ab  Vision Screening:      Vision Comments: Normal eye exam was July 2010 with Dr Silverio Lay   Primary Care Provider:  norins  CC:  pt here for cpx and she had never had a colonoscopy and has yearly paps done with Dr Billy Coast ab.  History of Present Illness: Patient presents for routine medical follow-up. She has no complaints and is feeling. She is up to date with Dr. Rosemary Holms.   Current Medications (verified): 1)  Multivitamins   Tabs (Multiple Vitamin) .... Take One Tablet Once Daily 2)  Vitamin E 400 Unit  Caps (Vitamin E) .... Take One Capsule Daily 3)  Xanax 0.5 Mg  Tabs (Alprazolam) .... Take One Tablet Every 8 Hours As Needed 4)  Prozac 40 Mg Caps (Fluoxetine Hcl) .Marland Kitchen.. 1 Cap Daily 5)  Vitamin C 1000 Mg  Tabs (Ascorbic Acid) .... Take 1 Tablet By Mouth Once A Day 6)  Arimidex 1 Mg  Tabs (Anastrozole) .... Take 1 Tablet By Mouth Once A Day  Allergies (verified): 1)  ! 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)    Physician Roster:           onc - Magrinat           gyn-  Taavon    Past Surgical History: Last updated: 04/19/2007 MASTECTOMY, RIGHT, HX OF (ICD-V45.71) BREAST BIOPSY, HX OF (ICD-V15.9) * Hx of SINUS STRIPPING     Family History: Last updated: 04/20/2007 father- '23; prostate cancer, HTN, DM mother -'42; breast cancer grandmother - breast cancer Neg- colon cancer, CAD  Social History: Last updated: 04/20/2007 single work: IT consultant, has a dog walking business Lives alone  Review of Systems  The patient denies anorexia, fever, weight loss, weight gain, vision loss, decreased hearing, hoarseness, chest pain, syncope, dyspnea on exertion, peripheral edema, headaches, hemoptysis, abdominal pain, melena, hematochezia, severe indigestion/heartburn, hematuria, incontinence, genital sores, muscle weakness, suspicious skin lesions, difficulty walking, unusual weight change, abnormal bleeding, enlarged lymph nodes, angioedema, and breast masses.    Physical Exam  General:  Thin but nourished white female in no distress Head:  Normocephalic and atraumatic without obvious abnormalities. No apparent alopecia or balding. Eyes:  No corneal or conjunctival inflammation noted. EOMI. Perrla. Funduscopic exam benign, without hemorrhages, exudates or papilledema. Vision grossly normal. Ears:  External ear exam shows no significant lesions or deformities.  Otoscopic examination reveals clear canals, tympanic membranes are intact bilaterally without bulging, retraction, inflammation or discharge. Hearing is grossly normal bilaterally. Nose:  no external deformity and no external erythema.   Mouth:  Oral  mucosa and oropharynx without lesions or exudates.  Teeth in good repair. Neck:  supple, full ROM, no thyromegaly, and no carotid bruits.   Chest Wall:  no deformities.   Breasts:  deferred to Tavon and Magrinat Lungs:  Normal respiratory effort, chest expands symmetrically. Lungs are clear to auscultation, no crackles or wheezes. Heart:  Normal rate and regular rhythm. S1 and S2 normal without gallop, murmur, click, rub or other extra sounds. Abdomen:  soft, non-tender, normal bowel sounds, no masses,  no guarding, and no hepatomegaly.   Genitalia:  Deferred to gyn Msk:  normal ROM, no joint tenderness, no joint swelling, no joint warmth, and no joint instability.   Pulses:  2+ radial and DP pulses Extremities:  No clubbing, cyanosis, edema, or deformity noted with normal full range of motion of all joints.   Neurologic:  alert & oriented X3, cranial nerves II-XII intact, strength normal in all extremities, sensation intact to pinprick, gait normal, and DTRs symmetrical and normal.   Skin:  turgor normal, color normal, no rashes, no ulcerations, and no edema.   Cervical Nodes:  no anterior cervical adenopathy and no posterior cervical adenopathy.   Psych:  Oriented X3, memory intact for recent and remote, normally interactive, good eye contact, and not anxious appearing.     Impression & Recommendations:  Problem # 1:  OTHER MALAISE AND FATIGUE (ICD-780.79) Generally feeling well. Her TSH is mildly elevated and could be symptomatic.  Plan - trial of Levothyroxine 25 micrograms once a day. If no improvement in energy, hair, skin can discontinue.   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 She follows with Dr. Darnelle Catalan  Problem # 3:  Preventive Health Care (ICD-V70.0) Unremarkable history and normal exam. Her lab results are fabulous with LDL 94 with all else being normal. She is current with tetnus immunization.   In summary- a very nice woman who appears medically stable. She should have a general medical exam in 18-24 months. Lipid and general chemistries for screening purposes in 24-36 months. She is a candidate for colonoscopy at any time.  Complete Medication List: 1)  Multivitamins Tabs (Multiple vitamin) .... Take one tablet once daily 2)  Vitamin E 400 Unit Caps (Vitamin e) .... Take one capsule daily 3)  Xanax 0.5 Mg Tabs (Alprazolam) .... Take one tablet every 8 hours as needed 4)  Prozac 40 Mg Caps  (Fluoxetine hcl) .Marland Kitchen.. 1 cap daily 5)  Vitamin C 1000 Mg Tabs (Ascorbic acid) .... Take 1 tablet by mouth once a day 6)  Arimidex 1 Mg Tabs (Anastrozole) .... Take 1 tablet by mouth once a day 7)  Levothyroxine Sodium 25 Mcg Tabs (Levothyroxine sodium) .Marland Kitchen.. 1 by mouth once daily for hypothyroidism -mild  Other Orders: Gastroenterology Referral (GI) Prescriptions: LEVOTHYROXINE SODIUM 25 MCG TABS (LEVOTHYROXINE SODIUM) 1 by mouth once daily for hypothyroidism -mild  #30 x 12   Entered and Authorized by:   Jacques Navy MD   Signed by:   Jacques Navy MD on 07/10/2009   Method used:   Electronically to        [id number] ([facility])       [address]       [place], [place]  [address]       Ph: [phone] or [phone]       Fax: [phone]   RxID:   [id number]     Immunization History:  Tetanus/Td  Immunization History:    Tetanus/Td:  historical (04/23/2006)   Appended Document: PHYSICAL  BCBS  STC ent: Kennia Litzau Note: All result statuses are Final unless otherwise noted.  Tests: (1) BMP (METABOL)   Sodium                    141 mEq/L                   135-145   Potassium                 5.0 mEq/L                   3.5-5.1   Chloride                  106 mEq/L                   96-112   Carbon Dioxide            31 mEq/L                    19-32   Glucose                   79 mg/dL                    29-56   BUN                  [H]  24 mg/dL                    2-13   Creatinine                0.5 mg/dL                   0.8-6.5   Calcium                   9.0 mg/dL                   7.8-46.9   GFR                       155.79 mL/min               >60  Tests: (2) Lipid Panel (LIPID)   Cholesterol               172 mg/dL                   6-295     ATP III Classification            Desirable:  < 200 mg/dL                    Borderline High:  200 - 239 mg/dL               High:  > = 240 mg/dL   Triglycerides             46.0 mg/dL                   2.8-413.2     Normal:  <150 mg/dL     Borderline High:  440 - 199 mg/dL   HDL                       10.27  mg/dL                 >16.10   VLDL Cholesterol          9.2 mg/dL                   9.6-04.5   LDL Cholesterol           94 mg/dL                    4-09  CHO/HDL Ratio:  CHD Risk                             3                    Men          Women     1/2 Average Risk     3.4          3.3     Average Risk          5.0          4.4     2X Average Risk          9.6          7.1     3X Average Risk          15.0          11.0                           Tests: (3) CBC Platelet w/Diff (CBCD)   White Cell Count     [L]  4.3 K/uL                    4.5-10.5   Red Cell Count       [L]  3.82 Mil/uL                 3.87-5.11   Hemoglobin                12.6 g/dL                   81.1-91.4   Hematocrit                36.0 %                      36.0-46.0   MCV                       94.3 fl                     78.0-100.0   MCHC                      35.1 g/dL                   78.2-95.6   RDW                       12.8 %                      11.5-14.6   Platelet Count            162.0 K/uL  150.0-400.0   Neutrophil %              51.0 %                      43.0-77.0   Lymphocyte %              30.4 %                      12.0-46.0   Monocyte %                10.3 %                      3.0-12.0   Eosinophils%         [H]  7.9 %                       0.0-5.0   Basophils %               0.4 %                       0.0-3.0   Neutrophill Absolute      2.2 K/uL                    1.4-7.7   Lymphocyte Absolute       1.3 K/uL                    0.7-4.0   Monocyte Absolute         0.4 K/uL                    0.1-1.0  Eosinophils, Absolute                             0.3 K/uL                    0.0-0.7   Basophils Absolute        0.0 K/uL                    0.0-0.1  Tests: (4) Hepatic/Liver Function Panel (HEPATIC)   Total Bilirubin           0.6 mg/dL                   0.4-5.4    Direct Bilirubin          0.1 mg/dL                   0.9-8.1   Alkaline Phosphatase      41 U/L                      39-117   AST                       27 U/L                      0-37   ALT                       34 U/L                      0-35  Total Protein             6.1 g/dL                    1.6-1.0   Albumin                   3.9 g/dL                    9.6-0.4  Tests: (5) TSH (TSH)   FastTSH              [H]  8.51 uIU/mL                 0.35-5.50  Tests: (6) UDip Only (UDIP)   Color                     Yellow       RANGE:  Yellow;Lt. Yellow   Clarity                   CLEAR                       Clear   Specific Gravity          1.020                       1.000 - 1.030   Urine Ph                  6.0                         5.0-8.0   Protein                   NEGATIVE                    Negative   Urine Glucose             NEGATIVE                    Negative   Ketones                   NEGATIVE                    Negative   Urine Bilirubin           NEGATIVE                    Negative   Blood                     NEGATIVE                    Negative   Urobilinogen              0.2                         0.0 - 1.0   Leukocyte Esterace        NEGATIVE                    Negative   Nitrite                   NEGATIVE  Negative

## 2010-03-25 NOTE — Letter (Signed)
Summary: Previsit letter  [facility]  [address] [place], [place] [address]   Phone: [phone]  Fax: [phone]       09/15/2009 MRN: [id number]  Kathryn Mcdaniel [address] [place], [place]  [address]  Dear [name],  Welcome to the Gastroenterology Division at [facility].    You are scheduled to see a nurse for your pre-procedure visit on 10-27-09 at 1:30p.m. on the 3rd floor at [facility], [address]  We ask that you try to arrive at our office 15 minutes prior to your appointment time to allow for check-in.  Your nurse visit will consist of discussing your medical and surgical history, your immediate family medical history, and your medications.    Please bring a complete list of all your medications or, if you prefer, bring the medication bottles and we will list them.  We will need to be aware of both prescribed and over the counter drugs.  We will need to know exact dosage information as well.  If you are on blood thinners (Coumadin, Plavix, Aggrenox, Ticlid, etc.) please call our office today/prior to your appointment, as we need to consult with your physician about holding your medication.   Please be prepared to read and sign documents such as consent forms, a financial agreement, and acknowledgement forms.  If necessary, and with your consent, a friend or relative is welcome to sit-in on the nurse visit with you.  Please bring your insurance card so that we may make a copy of it.  If your insurance requires a referral to see a specialist, please bring your referral form from your primary care physician.  No co-pay is required for this nurse visit.     If you cannot keep your appointment, please call [phone] to cancel or reschedule prior to your appointment date.  This allows [place] the opportunity to schedule an appointment for another patient in need of care.    Thank you for choosing [facility] for your medical needs.   We appreciate the opportunity to care for you.  Please visit [place] at our website  to learn more about our practice.                     Sincerely.                                                                                                                   The Gastroenterology Division

## 2010-03-25 NOTE — Assessment & Plan Note (Signed)
Summary: back/pelvic pain/SD   Vital Signs:  Patient profile:   55 year old female Height:      67 inches Weight:      118 pounds BMI:     18.55 O2 Sat:      97 % on Room air Temp:     99.2 degrees F oral Pulse rate:   78 / minute BP sitting:   112 / 62  (left arm) Cuff size:   regular  Vitals Entered By: Bill Salinas CMA (Jul 16, 2009 2:21 PM)  O2 Flow:  Room air CC: pt here with c/o lower back pain radiating to left pelvic area x 2 days/ ab   Primary Care Provider:  norins  CC:  pt here with c/o lower back pain radiating to left pelvic area x 2 days/ ab.  History of Present Illness: Has had chronic mild low back pain. Over the past several days she has had increased pain acorss the low back: it hurts with sitting, walking. Pain will radiate down the left leg. She will have some pressure in the lower abdomen. No paresthesia. No weakness or falls. No precipitating injury or activity. She has tried aleve with no results. Has not tried heat or cold.   Current Medications (verified): 1)  Multivitamins   Tabs (Multiple Vitamin) .... Take One Tablet Once Daily 2)  Vitamin E 400 Unit  Caps (Vitamin E) .... Take One Capsule Daily 3)  Xanax 0.5 Mg  Tabs (Alprazolam) .... Take One Tablet Every 8 Hours As Needed 4)  Prozac 40 Mg Caps (Fluoxetine Hcl) .Marland Kitchen.. 1 Cap Daily 5)  Vitamin C 1000 Mg  Tabs (Ascorbic Acid) .... Take 1 Tablet By Mouth Once A Day 6)  Arimidex 1 Mg  Tabs (Anastrozole) .... Take 1 Tablet By Mouth Once A Day 7)  Levothyroxine Sodium 25 Mcg Tabs (Levothyroxine Sodium) .Marland Kitchen.. 1 By Mouth Once Daily For Hypothyroidism -Mild  Allergies (verified): 1)  ! Reglan PMH-FH-SH reviewed-no changes except otherwise noted  Review of Systems  The patient denies anorexia, fever, weight loss, weight gain, syncope, peripheral edema, hemoptysis, abdominal pain, severe indigestion/heartburn, muscle weakness, suspicious skin lesions, depression, abnormal bleeding, and enlarged lymph nodes.      Physical Exam  General:  Thin female in no acute distress Head:  Normocephalic and atraumatic without obvious abnormalities. No apparent alopecia or balding. Lungs:  normal respiratory effort.   Heart:  normal rate and regular rhythm.   Msk:  back exam: nl stand, flex to 180 degrees, nl gait, nl toe/heel walk, nl step-up, nl DTRs patellar tendon, nl SLR sitting, nl sensation light touch/pin prick, no CVAT   Impression & Recommendations:  Problem # 1:  LOW BACK PAIN, ACUTE (ICD-724.2) no radicular symptoms on exam. Suspect low back strain.  Plan - heat          otc NSAIDs          APAP  Complete Medication List: 1)  Multivitamins Tabs (Multiple vitamin) .... Take one tablet once daily 2)  Vitamin E 400 Unit Caps (Vitamin e) .... Take one capsule daily 3)  Xanax 0.5 Mg Tabs (Alprazolam) .... Take one tablet every 8 hours as needed 4)  Prozac 40 Mg Caps (Fluoxetine hcl) .Marland Kitchen.. 1 cap daily 5)  Vitamin C 1000 Mg Tabs (Ascorbic acid) .... Take 1 tablet by mouth once a day 6)  Arimidex 1 Mg Tabs (Anastrozole) .... Take 1 tablet by mouth once a day 7)  Levothyroxine Sodium 25 Mcg  Tabs (Levothyroxine sodium) .Marland Kitchen.. 1 by mouth once daily for hypothyroidism -mild

## 2010-03-25 NOTE — Procedures (Signed)
Summary: Colonoscopy  Patient: [name] Note: All result statuses are Final unless otherwise noted.  Tests: (1) Colonoscopy (COL)   COL Colonoscopy           DONE     [facility]     [address]     [place], [place]  [address]           COLONOSCOPY PROCEDURE REPORT           PATIENT:  Kathryn Mcdaniel, Kathryn Mcdaniel  MR#:  [id number]     BIRTHDATE:  [birth year], 53 yrs. old  GENDER:  female     ENDOSCOPIST:  Iva Boop, MD, [facility]     REF. BY:  Rosalyn Gess. Norins, M.D.     PROCEDURE DATE:  11/10/2009     PROCEDURE:  Colonoscopy 78469     ASA CLASS:  Class I     INDICATIONS:  Routine Risk Screening     MEDICATIONS:   Fentanyl 75 mcg IV, Versed 6 mg IV           DESCRIPTION OF PROCEDURE:   After the risks benefits and     alternatives of the procedure were thoroughly explained, informed     consent was obtained.  Digital rectal exam was performed and     revealed no abnormalities.   The LB [id number]-[id number] endoscope     was introduced through the anus and advanced to the cecum, which     was identified by both the appendix and ileocecal valve, without     limitations.  The quality of the prep was excellent, using     MoviPrep.  The instrument was then slowly withdrawn as the colon     was fully examined. Insertion: 5:00 minutes Withdrawal: 9:19     minutes     <<PROCEDUREIMAGES>>           FINDINGS:  A normal appearing cecum, ileocecal valve, and     appendiceal orifice were identified. The ascending, hepatic     flexure, transverse, splenic flexure, descending, sigmoid colon,     and rectum appeared unremarkable. Right colon retroflexion was     performed.  Retroflexed views in the rectum revealed external     hemorrhoids.    The scope was then withdrawn from the patient and     the procedure completed.           COMPLICATIONS:  None     ENDOSCOPIC IMPRESSION:     1) Normal colon     2) External hemorrhoids in the rectum     3) Excellent prep           REPEAT EXAM:   In 10 year(s) for routine screening colonoscopy.           Iva Boop, MD, [facility]           CC:  Jacques Navy, MD     Ruthann Cancer, MD     The Patient           n.     Rosalie Doctor:   Iva Boop at 11/10/2009 10:40 AM           Salome Holmes, [phone]  Note: An exclamation mark (!) indicates a result that was not dispersed into the flowsheet. Document Creation Date: 11/10/2009 10:41 AM _______________________________________________________________________  (1) Order result status: Final Collection or observation date-time: 11/10/2009 10:29 Requested date-time:  Receipt date-time:  Reported date-time:  Referring Physician:   Ordering Physician:  Stan Head [id number]) Specimen Source:  Source: Launa Grill Order Number: [id number] Lab site:   Appended Document: Colonoscopy    Clinical Lists Changes  Observations: Added new observation of COLONNXTDUE: 10/2019 (11/10/2009 13:02)

## 2010-06-10 ENCOUNTER — Telehealth: Payer: Self-pay | Admitting: *Deleted

## 2010-06-10 NOTE — Telephone Encounter (Signed)
Will go with labs only: TSH, FT4.  Thanks

## 2010-06-10 NOTE — Telephone Encounter (Signed)
Pt is due for refill on her thyroid med. Does she need OV? Or labs only?

## 2010-06-11 NOTE — Telephone Encounter (Signed)
Left vm for pt on hm # (ok per HIPPA)

## 2010-06-15 ENCOUNTER — Other Ambulatory Visit: Payer: Self-pay | Admitting: Internal Medicine

## 2010-06-15 ENCOUNTER — Other Ambulatory Visit (INDEPENDENT_AMBULATORY_CARE_PROVIDER_SITE_OTHER): Payer: Self-pay

## 2010-06-15 ENCOUNTER — Telehealth: Payer: Self-pay | Admitting: *Deleted

## 2010-06-15 DIAGNOSIS — R5383 Other fatigue: Secondary | ICD-10-CM

## 2010-06-15 DIAGNOSIS — R5381 Other malaise: Secondary | ICD-10-CM

## 2010-06-15 LAB — T4, FREE: Free T4: 1 ng/dL (ref 0.60–1.60)

## 2010-06-15 NOTE — Telephone Encounter (Signed)
Patient requesting results from today's labs as soon as possible. She needs thryroid Rx to go to [facility].

## 2010-06-16 MED ORDER — LEVOTHYROXINE SODIUM 25 MCG PO TABS: 25.0000 ug | ORAL_TABLET | Freq: Every day | ORAL | Status: DC

## 2010-06-16 NOTE — Telephone Encounter (Signed)
TSH 4.64 - normal range; FT4 1.0 - normal range.  Please renew her present dose of medication good for one year. Thanks

## 2010-06-16 NOTE — Telephone Encounter (Signed)
Left vm for pt - rx sent in

## 2010-06-19 ENCOUNTER — Telehealth: Payer: Self-pay | Admitting: Internal Medicine

## 2010-06-19 NOTE — Telephone Encounter (Signed)
Thyroid labs are in normal range. OK to continue on present dose of thyroid replacment. Rx can be refilled for a year.

## 2010-06-22 MED ORDER — LEVOTHYROXINE SODIUM 25 MCG PO TABS: 25.0000 ug | ORAL_TABLET | Freq: Every day | ORAL | Status: DC

## 2010-06-22 NOTE — Telephone Encounter (Signed)
See previous phone note, pt already aware

## 2010-06-22 NOTE — Telephone Encounter (Signed)
68yr refilled sent in and lmovm for pt to call back

## 2010-07-06 NOTE — Assessment & Plan Note (Signed)
[facility]                                 ON-CALL NOTE   Kathryn Mcdaniel, Kathryn Mcdaniel                        MRN:          [id number]  DATE:11/18/2006                            DOB:          May 21, 1955    DATE OF CALL:  November 18, 2006.   TIME:  2:11 p.m.   TELEPHONE NUMBER:  [id number].   ON CALL PROGRESS NOTE:  The caller is Tammy at [facility]. She sees  Dr. Debby Bud. I had seen the patient earlier today in our Saturday clinic  with 3 days of a bad headache. Please see my clinic note concerning  details of my evaluation and our treatment plan. I did send her for a  non-contrasted head CT scan, to rule out  any other etiologies. The  radiologists report was that this was negative. This was relayed to the  patient, who was told to go home, follow our instructions, and to let me  know if she needed anything else.     Tera Mater. Clent Ridges, MD  Electronically Signed    SAF/MedQ  DD: 11/18/2006  DT: 11/18/2006  Job #: [id number]

## 2010-07-09 NOTE — Op Note (Signed)
Kathryn Mcdaniel, Kathryn Mcdaniel               ACCOUNT NO.:  [id number]   MEDICAL RECORD NO.:  [id number]          PATIENT TYPE:  AMB   LOCATION:  DAY                          FACILITY:  [facility]   PHYSICIAN:  Angelia Mould. Derrell Lolling, M.D.DATE OF BIRTH:  23-Jun-1955   DATE OF PROCEDURE:  01/03/2006  DATE OF DISCHARGE:                                 OPERATIVE REPORT   PREOPERATIVE DIAGNOSIS:  Cancer of the right breast.   POSTOPERATIVE DIAGNOSIS:  Cancer of the right breast.   OPERATION PERFORMED:  Insertion of Power Port venous vascular access device  with flouroscopic guidance   SURGEON:  Angelia Mould. Derrell Lolling, M.D.   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  She has been evaluated by Dr. Darnelle Catalan, who has begun  neoadjuvant chemotherapy.  After her chemotherapy  we plan a mastectomy on  the right side.  Dr. Darnelle Catalan has requested that a port be placed.  One of  the patient's family members advised her to have a double-lumen port.  Dr.  Darnelle Catalan states that a single-lumen port is all that is required from his  standpoint.  The patient has a very small body habitus, and I advised a  single-lumen port because of the size of the port and the size of the  catheter.  The patient was in full agreement, and so we elected to do a  single-lumen port.   OPERATIVE TECHNIQUE:  The patient was brought to the operating room, placed  supine on the operating table with her arms at her sides.  She was monitored  and sedated by the anesthesia department.  The patient was identified as to  correct procedure and correct patient.  Intravenous antibiotics were given  prior to the incision.  A left subclavian venipuncture was performed and the  guidewire inserted into the superior vena cava using fluoroscopic guidance.  A small transverse incision was made at the wire insertion site.  About a  2.5  cm incision was made in the left infraclavicular area about 3 cm below  the wire insertion site.  A subcutaneous pocket was created down to the  level of the pectoralis fascia.  The catheter was drawn between the two  incisions using a small tunneling device.  Using fluoroscopy as a guide, we  marked on the chest wall where the tip of the catheter should be in the  superior vena cava just above the right atrium.  The catheter was then  marked and cut.  The catheter was secured to the port with a locking device  and the port and catheter flushed with heparinized saline.  The port was  secured to the pectoralis fascia with multiple interrupted sutures of 2-0  Prolene.  The port flushed easily.  With the patient back in Trendelenburg  position, we inserted the dilator and peel-away sheath assembly over the  guidewire into the superior vena cava.  The wire and dilator were removed.  The port was inserted through the peel-away sheath and the  peel-away sheath  removed.  The catheter flushed easily and had excellent blood return.  Fluoroscopy confirmed that the tip of the catheter was in the superior vena  cava above the right atrium and there was no kinking or crimping of the  catheter anywhere along its course.  Hemostasis was excellent.  The  subcutaneous tissue was closed over the catheter with interrupted sutures of  3-0 Vicryl.  The skin incisions were closed with subcuticular sutures of 4-0  Monocryl and Steri-Strips.  We then brought an angled Huber needle, which  was switched onto an IV extension tubing to the operative field.  This was  flushed with heparinized saline.  The needle was inserted into the port.  Once again we demonstrated good blood return and excellent flushing.  The IV  extension tubing was then flushed with concentrated heparin solution, 100  units/mL.  This was capped off and the two security clamps placed.  This was  secured in place with Steri-Strips and small gauze  bandages and a Tegaderm.  Chemotherapy is planned for tomorrow.   The patient tolerated the procedure well and was taken to the recovery room  in stable condition.  Estimated blood loss about 10 mL.  A chest x-ray is  planned in the recovery room.      Angelia Mould. Derrell Lolling, M.D.  Electronically Signed     HMI/MEDQ  D:  01/03/2006  T:  01/03/2006  Job:  [id number]   cc:   Valentino Hue. Magrinat, M.D.  Fax: [phone]   Rosalyn Gess. Norins, MD  [address]  [place]  [place] [address]

## 2010-07-09 NOTE — Op Note (Signed)
Kathryn Mcdaniel, Kathryn Mcdaniel               ACCOUNT NO.:  [id number]   MEDICAL RECORD NO.:  [id number]              PATIENT TYPE:   LOCATION:                                 FACILITY:   PHYSICIAN:  Angelia Mould. Derrell Lolling, M.D.     DATE OF BIRTH:   DATE OF PROCEDURE:  05/22/2006  DATE OF DISCHARGE:                               OPERATIVE REPORT   PREOPERATIVE DIAGNOSIS:  Carcinoma right breast.   POSTOPERATIVE DIAGNOSIS:  Carcinoma right breast.   OPERATION PERFORMED:  1. Injected blue dye right breast.  2. Remove Port-A-Cath.  3. Right axillary sentinel lymph node mapping and biopsy.  4. Right total mastectomy.   SURGEON:  Dr. Claud Kelp   OPERATIVE INDICATIONS:  This is a 55 year old white female who felt a  lump in the lateral aspect the right breast back in August or September.  Mammogram showed dense breast but were nondiagnostic.  The ultrasound  MRI and further biopsies were done.  In the right breast at the 9  o'clock position, there was a 4.8 cm enhancing mass, a second area of  enhancement at 12 o'clock position 3.3 cm and third area of  in the  upper inner quadrant 1.3 cm.  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  The biopsy reported the mass laterally at the 9 o'clock  position showed mucinous invasive mammary carcinoma.  There was no sign  of any enlarged lymph nodes on the MRI and the left breast looked fine.  She did have a family history of bilateral breast cancer in her mother  and breast cancer in grandmother on the father's side.  She has seen Dr.  Marikay Alar Magrinat.  She has undergone neoadjuvant chemotherapy and by MRI the  masses are smaller and in fact only 2 masses are seen.  In light of her  multifocal enhancement and her family history, I have advised her to  undergo a total mastectomy.  She had a Port-A-Cath inserted in the left  infraclavicular area several months ago.  Dr. Darnelle Catalan has  said we could  remove that.  She is brought to operating room electively.   OPERATIVE TECHNIQUE:  The patient underwent injection of a 1 mCi of  radionuclide in the holding area preop.  The patient was taken to the  operating room.  General anesthesia was induced.  The patient was  identified as to correct patient, correct site and correct procedure.  The right breast and axilla and the left chest wall were prepped and  draped in sterile fashion.  Intravenous antibiotics were given prior to  the incision.   I chose to remove the Port-A-Cath first.  The transverse incision was  made in the left infraclavicular area where the port was palpable.  Dissection was carried down through subcutaneous tissue and the port was  identified.  I lifted the port up and divided two Prolene sutures  holding the port in place and removed the sutures.  I then dissected the  port with its capsule  and remove the port in the catheter intact.  There  was no bleeding.  The port was noted in the operative record.  Pathologic exam was not necessary.  Subcutaneous tissue was closed with  interrupted suture of 3-0 Vicryl.  Skin closed with running subcuticular  suture of 4-0 Monocryl and Steri-Strips.   Attention was then directed into the right breast and right axilla.  The  NeoProbe was used.   It should be noted that prior to prepping and draping I did inject 5 mL  of blue dye into the right breast and subareolar area.  This was 2 mL of  methylene blue mixed with 3 mL of saline.  This was injected after  alcohol prep in the subareolar area in the breast was massaged for 5  minutes prior to prepping and draping.   Returning to the operative procedure, the NeoProbe was used and  identified an area in the right axilla where the radionuclide counts  were quite high.  I then marked a transverse elliptical incision for the  mastectomy.  This incision was made superiorly and inferiorly and  dissection was carried  down through subcutaneous tissue.   I then dissected the skin flaps superolaterally and inferolaterally,  which took me into the axilla.  Then using the NeoProbe I tracked out to  the sentinel lymph nodes.  These were both blue and they were both  extremely hot measuring 50 or more times background activity.  These  were both examined by Dr. Jimmy Picket.  He stated that there was no  evidence of any cancer cells on imprint cytology.  There were a couple  other lymph nodes in the area which had about 3x background activity.  They were not blue and I thought that I might have damaged their blood  supply and so I simply resected and sent them for routine histology.  There was no significant palpable adenopathy.   Skin flaps were then raised superiorly, medially, inferiorly and  laterally.  Laterally to latissimus dorsi muscle, inferiorly to the  rectus sheath, medially to the parasternal area and superiorly to the  infraclavicular fascia.  I then dissected the breast and its underlying  pectoralis fascia all from the pectoralis major muscle and pectoralis  minor muscle leaving the muscles intact.  The specimen was removed and  sent for routine histology.  Appropriate clinical history was attached.  The wound was irrigated with saline.  Hemostasis was excellent and  achieved with electrocautery.  Two 19-French Blake drains were placed  one up into the axillary area and one across the skin flaps.  These were  brought out through separate stab incisions laterally and sutured the  skin with nylon sutures.  The skin was closed with skin staples.  The  mastectomy wound and port wound were then covered with Adaptic gauze,  4x4s, ABDs and 6-inch Ace wrap.  The patient tolerated the procedure  well and was taken to the recovery room in stable condition.  Estimated  blood loss was about 100 mL.  Complications none.  Sponge and instrument counts were correct.      Angelia Mould. Derrell Lolling, M.D.   Electronically Signed     HMI/MEDQ  D:  05/22/2006  T:  05/22/2006  Job:  [id number]   cc:   Valentino Hue. Magrinat, M.D.  Rosalyn Gess Norins, MD  Richardean Sale, M.D.

## 2010-09-09 ENCOUNTER — Other Ambulatory Visit: Payer: Self-pay | Admitting: Oncology

## 2010-09-09 DIAGNOSIS — Z9010 Acquired absence of unspecified breast and nipple: Secondary | ICD-10-CM

## 2010-09-15 ENCOUNTER — Ambulatory Visit
Admission: RE | Admit: 2010-09-15 | Discharge: 2010-09-15 | Disposition: A | Discharge status: Home / Self Care | Payer: BC Managed Care – PPO | Source: Ambulatory Visit | Attending: Oncology | Admitting: Oncology

## 2010-09-15 DIAGNOSIS — Z9010 Acquired absence of unspecified breast and nipple: Secondary | ICD-10-CM

## 2010-12-30 ENCOUNTER — Other Ambulatory Visit (HOSPITAL_BASED_OUTPATIENT_CLINIC_OR_DEPARTMENT_OTHER): Payer: BC Managed Care – PPO | Admitting: Lab

## 2010-12-30 ENCOUNTER — Other Ambulatory Visit: Payer: Self-pay | Admitting: Oncology

## 2010-12-30 DIAGNOSIS — M949 Disorder of cartilage, unspecified: Secondary | ICD-10-CM

## 2010-12-30 DIAGNOSIS — M899 Disorder of bone, unspecified: Secondary | ICD-10-CM

## 2010-12-30 DIAGNOSIS — C50919 Malignant neoplasm of unspecified site of unspecified female breast: Secondary | ICD-10-CM

## 2010-12-30 LAB — CBC WITH DIFFERENTIAL/PLATELET
BASO%: 0.2 % (ref 0.0–2.0)
EOS%: 3.8 % (ref 0.0–7.0)
LYMPH%: 25.5 % (ref 14.0–49.7)
MCH: 32.7 pg (ref 25.1–34.0)
MCHC: 34.8 g/dL (ref 31.5–36.0)
MONO#: 0.3 10*3/uL (ref 0.1–0.9)
RBC: 3.73 10*6/uL (ref 3.70–5.45)
WBC: 5.1 10*3/uL (ref 3.9–10.3)
lymph#: 1.3 10*3/uL (ref 0.9–3.3)

## 2010-12-31 LAB — COMPREHENSIVE METABOLIC PANEL
AST: 27 U/L (ref 0–37)
Alkaline Phosphatase: 41 U/L (ref 39–117)
BUN: 17 mg/dL (ref 6–23)
Calcium: 9.2 mg/dL (ref 8.4–10.5)
Chloride: 103 mEq/L (ref 96–112)
Creatinine, Ser: 0.56 mg/dL (ref 0.50–1.10)

## 2011-01-06 ENCOUNTER — Telehealth: Payer: Self-pay | Admitting: *Deleted

## 2011-01-06 ENCOUNTER — Other Ambulatory Visit: Payer: Self-pay | Admitting: Oncology

## 2011-01-06 ENCOUNTER — Ambulatory Visit (HOSPITAL_BASED_OUTPATIENT_CLINIC_OR_DEPARTMENT_OTHER): Payer: BC Managed Care – PPO | Admitting: Oncology

## 2011-01-06 VITALS — BP 122/79 | HR 77 | Temp 97.8°F | Ht 67.0 in | Wt 117.2 lb

## 2011-01-06 DIAGNOSIS — C50919 Malignant neoplasm of unspecified site of unspecified female breast: Secondary | ICD-10-CM

## 2011-01-06 DIAGNOSIS — M899 Disorder of bone, unspecified: Secondary | ICD-10-CM

## 2011-01-06 DIAGNOSIS — M8580 Other specified disorders of bone density and structure, unspecified site: Secondary | ICD-10-CM | POA: Insufficient documentation

## 2011-01-06 DIAGNOSIS — Z170 Estrogen receptor positive status [ER+]: Secondary | ICD-10-CM

## 2011-01-06 DIAGNOSIS — Z1231 Encounter for screening mammogram for malignant neoplasm of breast: Secondary | ICD-10-CM

## 2011-01-06 NOTE — Telephone Encounter (Signed)
Made patient appointment for 12-2011 and made patient appointments for mammogram and bone density

## 2011-01-06 NOTE — Progress Notes (Signed)
ID: Kathryn Mcdaniel   Interval History: Kathryn Mcdaniel returns today for followup of her breast cancer. The interval history is stable she continues to work a lot of hours, she visited reviewed again with her sister and is planning to go back February of 2013. Her insurance completely covered the BS DI and she was very pleased that there were not a pocket expenses.  ROS:  She continues to have some reflux symptoms. She is known to have a hiatal hernia. She is not taking any antacid as or proton pump inhibitors and I suggested she get some omeprazole over-the-counter and try that. She can also of course raise the head of her bed up , take TUMS, and other simple measures. She also complains of some urinary leakage. She tells me she'd urinates about 30 times a day. It does sound like bladder spasms. I suggested she documente this and let Dr. Debby Mcdaniel know. She has some discomfort in the right middle chest wall, which is about where she had her surgery. This is simply musculoskeletal. There is a little bit of discomfort at the most prominent part of her lower ribs. I do not think this is going to be cancer related either. Otherwise a detailed review of systems was noncontributory  Medications: I have reviewed the patient's current medications.   Current Outpatient Prescriptions  Medication Sig Dispense Refill  . ALPRAZolam (XANAX) 0.5 MG tablet Take 0.5 mg by mouth at bedtime as needed.        Marland Kitchen FLUoxetine (PROZAC) 20 MG capsule Take 20 mg by mouth daily.        Marland Kitchen anastrozole (ARIMIDEX) 1 MG tablet Take 1 mg by mouth daily.        Marland Kitchen levothyroxine (LEVOTHROID) 25 MCG tablet Take 1 tablet (25 mcg total) by mouth daily.  90 tablet  3     Objective: Vital signs in last 24 hours: BP 122/79  Pulse 77  Temp 97.8 F (36.6 Mcdaniel)  Ht 5\' 7"  (1.702 m)  Wt 117 lb 3.2 oz (53.162 kg)  BMI 18.36 kg/m2   Physical Exam:    Sclerae unicteric  Oropharynx clear  No peripheral adenopathy  Lungs clear -- no rales or  rhonchi  Heart regular rate and rhythm  Abdomen benign  MSK no focal spinal tenderness, no peripheral edema  Neuro nonfocal  Breast exam: Right breast is status post mastectomy no evidence of local recurrence, left breast no suspicious findings  Lab Results:   CMP     Component Value Date/Time   NA 139 12/30/2010 1349   NA 139 12/30/2010 1349   NA 139 12/30/2010 1349   K 4.4 12/30/2010 1349   K 4.4 12/30/2010 1349   K 4.4 12/30/2010 1349   CL 103 12/30/2010 1349   CL 103 12/30/2010 1349   CL 103 12/30/2010 1349   CO2 28 12/30/2010 1349   CO2 28 12/30/2010 1349   CO2 28 12/30/2010 1349   GLUCOSE 76 12/30/2010 1349   GLUCOSE 76 12/30/2010 1349   GLUCOSE 76 12/30/2010 1349   BUN 17 12/30/2010 1349   BUN 17 12/30/2010 1349   BUN 17 12/30/2010 1349   CREATININE 0.56 12/30/2010 1349   CREATININE 0.56 12/30/2010 1349   CREATININE 0.56 12/30/2010 1349   CALCIUM 9.2 12/30/2010 1349   CALCIUM 9.2 12/30/2010 1349   CALCIUM 9.2 12/30/2010 1349   PROT 6.5 12/30/2010 1349   PROT 6.5 12/30/2010 1349   PROT 6.5 12/30/2010 1349   ALBUMIN 4.4 12/30/2010 1349  ALBUMIN 4.4 12/30/2010 1349   ALBUMIN 4.4 12/30/2010 1349   AST 27 12/30/2010 1349   AST 27 12/30/2010 1349   AST 27 12/30/2010 1349   ALT 24 12/30/2010 1349   ALT 24 12/30/2010 1349   ALT 24 12/30/2010 1349   ALKPHOS 41 12/30/2010 1349   ALKPHOS 41 12/30/2010 1349   ALKPHOS 41 12/30/2010 1349   BILITOT 0.5 12/30/2010 1349   BILITOT 0.5 12/30/2010 1349   BILITOT 0.5 12/30/2010 1349   GFRNONAA 155.79 07/06/2009 0702   GFRAA 137 05/31/2007 1010    CBC    Component Value Date/Time   WBC 5.1 12/30/2010 1349   WBC 4.3* 07/06/2009 0702   RBC 3.82* 07/06/2009 0702   HGB 12.2 12/30/2010 1349   HGB 12.6 07/06/2009 0702   HCT 35.0 12/30/2010 1349   HCT 36.0 07/06/2009 0702   PLT 182 12/30/2010 1349   PLT 162.0 07/06/2009 0702   MCV 94.1 12/30/2010 1349   MCV 94.3 07/06/2009 0702   MCHC 35.1 07/06/2009 0702   RDW 12.8 07/06/2009 0702   LYMPHSABS 1.3 07/06/2009 0702    MONOABS 0.4 07/06/2009 0702   EOSABS 0.2 12/30/2010 1349   EOSABS 0.3 07/06/2009 0702   BASOSABS 0.0 12/30/2010 1349   BASOSABS 0.0 07/06/2009 1610        Studies/Results: She had left mammography July of 2012 which was negative but she has very dense breasts had a BX GI August of 2012 which was negative she had a colonoscopy September of 2011 under Dr.   Leone Mcdaniel. 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 Neoadjuvant E. she received docetaxel x4 and dose dense cyclophosphamide and doxorubicin x4, followed by right mastectomy and sentinel lymph node sampling March of 2008 for a 1.5 cm residual tumor involving 2/5 sentinel lymph nodes. After radiation she started tamoxifen with poor tolerance. She finally went on anastrozole May of 2009.   Plan:  The plan is to continue on anastrozole for total of 5 years, which will take [place] to May of 2014. She needs a repeat bone density and this is being operational lysed for January. I have asked her to document her bladder spasm issues and 1 she has the number of times she goes on the time of  day for a couple of days she can call Dr.Norins' Mcdaniel to get some guidance as to whether Urispas or some other medications may be useful. We also talked about strategies to deal with reflux. Overall I am delighted that she is doing so well. She will return to see me in one year. She knows to call for problems that may develop before then .  Kathryn Mcdaniel 01/06/2011

## 2011-01-07 ENCOUNTER — Encounter: Payer: Self-pay | Admitting: *Deleted

## 2011-05-24 ENCOUNTER — Other Ambulatory Visit: Payer: Self-pay | Admitting: Internal Medicine

## 2011-05-25 ENCOUNTER — Encounter: Payer: Self-pay | Admitting: Internal Medicine

## 2011-06-20 ENCOUNTER — Encounter: Payer: BC Managed Care – PPO | Admitting: Internal Medicine

## 2011-07-16 ENCOUNTER — Ambulatory Visit (INDEPENDENT_AMBULATORY_CARE_PROVIDER_SITE_OTHER): Payer: BC Managed Care – PPO | Admitting: Family Medicine

## 2011-07-16 VITALS — BP 108/68 | HR 80 | Temp 98.5°F | Wt 114.0 lb

## 2011-07-16 DIAGNOSIS — B029 Zoster without complications: Secondary | ICD-10-CM

## 2011-07-16 MED ORDER — VALACYCLOVIR HCL 1 G PO TABS: 1000.0000 mg | ORAL_TABLET | Freq: Three times a day (TID) | ORAL | Status: DC

## 2011-07-16 NOTE — Progress Notes (Signed)
Subjective:    Patient ID: Kathryn Mcdaniel, female    DOB: [birth year], 56 y.o.   MRN: [id number]  HPI Here with what she thinks is shingles (had it in 2008)  Is in R axilla -- started with some soreness -- 1 week ago , then tenderness and noticed rash this am  Pain is not too bad  No using any otc meds  No fever / feels ok   No rash elsewhere  No insect bites   Patient Active Problem List  Diagnoses  . HUMAN PAPILLOMAVIRUS  . CARCINOMA, INFILTRATING DUCTAL, RIGHT BREAST  . OTHER MALAISE AND FATIGUE  . CHEST PAIN  . PERSONAL HISTORY OF CERVICAL DYSPLASIA  . Unspecified Personal History Presenting Hazards to Health  . MASTECTOMY, RIGHT, HX OF  . Osteopenia   Past Medical History  Diagnosis Date  . 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 Personal history of cervical dysplasia   . Unspecified personal history presenting hazards to health     Radiation Therapy  . 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 Basal cell carcinoma of skin     left temple  . Abnormal breast biopsy    Past Surgical History  Procedure Date  . Breast surgery     right mastectomy  . Breast biopsy   . Nasal sinus surgery     sinus stripping   History  Substance Use Topics  . Smoking status: Not on file  . Smokeless tobacco: Not on file  . Alcohol Use:    Family History  Problem Relation Age of Onset  . Cancer Father     prostate  . Hypertension Father   . Cancer Brother     breast  . Colon cancer Neg Hx   . Coronary artery disease Neg Hx   . Cancer Other     grandmother, breast cancer   Allergies  Allergen Reactions  . Metoclopramide Hcl    Current Outpatient Prescriptions on File Prior to Visit  Medication Sig Dispense Refill  . ALPRAZolam (XANAX) 0.5 MG tablet Take 0.5 mg by mouth at bedtime as needed.        Marland Kitchen anastrozole (ARIMIDEX) 1 MG tablet Take 1 mg by mouth daily.        .  Ascorbic Acid (VITAMIN C) 1000 MG tablet Take 1,000 mg by mouth daily.      . calcium & magnesium carbonates (MYLANTA) 311-232 MG per tablet Take 1 tablet by mouth daily. With zinc       . FLUoxetine (PROZAC) 20 MG capsule Take 60 mg by mouth daily.       Marland Kitchen levothyroxine (LEVOTHROID) 25 MCG tablet Take 1 tablet (25 mcg total) by mouth daily.  90 tablet  3  . levothyroxine (SYNTHROID, LEVOTHROID) 25 MCG tablet TAKE 1 TABLET BY MOUTH EVERY DAY  30 tablet  1  . Multiple Vitamin (MULTIVITAMIN) tablet Take 1 tablet by mouth daily.      . vitamin E 400 UNIT capsule Take 400 Units by mouth daily.         Review of Systems Review of Systems  Constitutional: Negative for fever, appetite change, fatigue and unexpected weight change.  Eyes: Negative for pain and visual disturbance.  Respiratory: Negative for cough and shortness of breath.   Cardiovascular: Negative for cp or palpitations    Gastrointestinal: Negative for nausea, diarrhea and constipation.  Genitourinary: Negative for urgency and  frequency.  Skin: Negative for pallor and pos for rash Neurological: Negative for weakness, light-headedness, numbness and headaches.  Hematological: Negative for adenopathy. Does not bruise/bleed easily.  Psychiatric/Behavioral: Negative for dysphoric mood. The patient is not nervous/anxious.         Objective:   Physical Exam  Constitutional: She appears well-developed and well-nourished.       Slim and well appearing   Eyes: Conjunctivae and EOM are normal. Pupils are equal, round, and reactive to light. Right eye exhibits no discharge. Left eye exhibits no discharge.  Neck: Normal range of motion. Neck supple. No thyromegaly present.  Cardiovascular: Normal rate and regular rhythm.   Pulmonary/Chest: Effort normal and breath sounds normal. No respiratory distress.  Genitourinary:       R mastectomy noted   Musculoskeletal: She exhibits no edema.  Lymphadenopathy:    She has no cervical  adenopathy.  Neurological: She is alert.  Skin: Skin is warm and dry. Rash noted. There is erythema. No pallor.       Rash in R axilla - vesicular and patchy- slt erythema and slt tenderness No adenopathy No other rash noted     Psychiatric: She has a normal mood and affect.          Assessment & Plan:

## 2011-07-16 NOTE — Patient Instructions (Signed)
Take valtrex three times daily for 7 days  Keep area cool and dry and clean If increased pain or other changes please call

## 2011-07-16 NOTE — Assessment & Plan Note (Addendum)
Mild in R axilla - pain is mild also - this is recurrent per pt  Will tx with valtrex 1 g tid - adv to update if worse or more pain or other symptoms Will f/u next week if not improving I did adv pt to look into coverage for zoster vaccine when she is feeling better

## 2011-08-03 ENCOUNTER — Telehealth: Payer: Self-pay | Admitting: Internal Medicine

## 2011-08-03 NOTE — Telephone Encounter (Signed)
Requesting PCP Change to Dr.Lowne, per call from patient today, please review and advise & I will call patient back  Patient call back # [phone]

## 2011-08-03 NOTE — Telephone Encounter (Signed)
OK with me for change in PCP

## 2011-08-05 NOTE — Telephone Encounter (Signed)
Ok with me 

## 2011-08-05 NOTE — Telephone Encounter (Signed)
Dr.lowne, would you be willing to take on this patient?

## 2011-08-08 ENCOUNTER — Other Ambulatory Visit: Payer: Self-pay | Admitting: Internal Medicine

## 2011-08-08 NOTE — Telephone Encounter (Signed)
Patient is coming in 7.26.13 @ 2pm

## 2011-09-16 ENCOUNTER — Encounter: Payer: Self-pay | Admitting: Family Medicine

## 2011-09-16 ENCOUNTER — Ambulatory Visit (INDEPENDENT_AMBULATORY_CARE_PROVIDER_SITE_OTHER): Payer: BC Managed Care – PPO | Admitting: Family Medicine

## 2011-09-16 ENCOUNTER — Ambulatory Visit: Payer: BC Managed Care – PPO

## 2011-09-16 ENCOUNTER — Other Ambulatory Visit: Payer: BC Managed Care – PPO

## 2011-09-16 VITALS — BP 106/66 | HR 76 | Temp 98.9°F | Ht 67.0 in | Wt 115.4 lb

## 2011-09-16 DIAGNOSIS — E039 Hypothyroidism, unspecified: Secondary | ICD-10-CM

## 2011-09-16 DIAGNOSIS — Z0000 Encounter for general adult medical examination without abnormal findings: Secondary | ICD-10-CM

## 2011-09-16 DIAGNOSIS — C50919 Malignant neoplasm of unspecified site of unspecified female breast: Secondary | ICD-10-CM

## 2011-09-16 DIAGNOSIS — B977 Papillomavirus as the cause of diseases classified elsewhere: Secondary | ICD-10-CM

## 2011-09-16 NOTE — Progress Notes (Signed)
Subjective:     Kathryn Mcdaniel is a 56 y.o. female and is here for a comprehensive physical exam. The patient reports no problems.  History   Social History  . Marital Status: Single    Spouse Name: N/A    Number of Children: N/A  . Years of Education: N/A   Occupational History  . self employed    Social History Main Topics  . Smoking status: Former Smoker    Quit date: 09/15/1984  . Smokeless tobacco: Never Used  . Alcohol Use: 8.4 oz/week    14 Cans of beer per week     Occ  . Drug Use: No  . Sexually Active: Not on file   Other Topics Concern  . Not on file   Social History Narrative   SingleWork; IT consultant, has a dog walking businessExercise-- swim, walking dogs and bike   Health Maintenance  Topic Date Due  . Influenza Vaccine  11/22/2011  . Mammogram  09/14/2012  . Pap Smear  04/22/2014  . Tetanus/tdap  04/22/2016  . Colonoscopy  01/22/2020    The following portions of the patient's history were reviewed and updated as appropriate: allergies, current medications, past family history, past medical history, past social history, past surgical history and problem list.  Review of Systems Review of Systems  Constitutional: Negative for activity change, appetite change and fatigue.  HENT: Negative for hearing loss, congestion, tinnitus and ear discharge.  dentist q22m Eyes: Negative for visual disturbance (see optho q1y -- vision corrected to 20/20 with glasses).  Respiratory: Negative for cough, chest tightness and shortness of breath.   Cardiovascular: Negative for chest pain, palpitations and leg swelling.  Gastrointestinal: Negative for abdominal pain, diarrhea, constipation and abdominal distention.  Genitourinary: Negative for urgency, frequency, decreased urine volume and difficulty urinating.  Musculoskeletal: Negative for back pain, arthralgias and gait problem.  Skin: Negative for color change, pallor and rash.  Neurological: Negative for dizziness,  light-headedness, numbness and headaches.  Hematological: Negative for adenopathy. Does not bruise/bleed easily.  Psychiatric/Behavioral: Negative for suicidal ideas, confusion, sleep disturbance, self-injury, dysphoric mood, decreased concentration and agitation.       Objective:    BP 106/66  Pulse 76  Temp 98.9 F (37.2 C) (Oral)  Ht 5\' 7"  (1.702 m)  Wt 115 lb 6.4 oz (52.345 kg)  BMI 18.07 kg/m2  SpO2 99% General appearance: alert, cooperative, appears stated age and no distress Head: Normocephalic, without obvious abnormality, atraumatic Eyes: conjunctivae/corneas clear. PERRL, EOM's intact. Fundi benign. Ears: normal TM's and external ear canals both ears Nose: Nares normal. Septum midline. Mucosa normal. No drainage or sinus tenderness. Throat: lips, mucosa, and tongue normal; teeth and gums normal Neck: no adenopathy, no carotid bruit, no JVD, supple, symmetrical, trachea midline and thyroid not enlarged, symmetric, no tenderness/mass/nodules Back: + scoliosi Lungs: clear to auscultation bilaterally Breasts: gyn Heart: regular rate and rhythm, S1, S2 normal, no murmur, click, rub or gallop Abdomen: soft, non-tender; bowel sounds normal; no masses,  no organomegaly Pelvic: deferred-- gyn Extremities: extremities normal, atraumatic, no cyanosis or edema Pulses: 2+ and symmetric Skin: Skin color, texture, turgor normal. No rashes or lesions Lymph nodes: Cervical, supraclavicular, and axillary nodes normal. Neurologic: Alert and oriented X 3, normal strength and tone. Normal symmetric reflexes. Normal coordination and gait  psych=--- anxiety and depression  Assessment:    Healthy female exam.     Plan:  ghm utd  Check labs See After Visit Summary for Counseling Recommendations

## 2011-09-16 NOTE — Assessment & Plan Note (Signed)
Per onc  

## 2011-09-16 NOTE — Patient Instructions (Addendum)
Preventive Care for Adults, Female A healthy lifestyle and preventive care can promote health and wellness. Preventive health guidelines for women include the following key practices.  A routine yearly physical is a good way to check with your caregiver about your health and preventive screening. It is a chance to share any concerns and updates on your health, and to receive a thorough exam.   Visit your dentist for a routine exam and preventive care every 6 months. Brush your teeth twice a day and floss once a day. Good oral hygiene prevents tooth decay and gum disease.   The frequency of eye exams is based on your age, health, family medical history, use of contact lenses, and other factors. Follow your caregiver's recommendations for frequency of eye exams.   Eat a healthy diet. Foods like vegetables, fruits, whole grains, low-fat dairy products, and lean protein foods contain the nutrients you need without too many calories. Decrease your intake of foods high in solid fats, added sugars, and salt. Eat the right amount of calories for you.Get information about a proper diet from your caregiver, if necessary.   Regular physical exercise is one of the most important things you can do for your health. Most adults should get at least 150 minutes of moderate-intensity exercise (any activity that increases your heart rate and causes you to sweat) each week. In addition, most adults need muscle-strengthening exercises on 2 or more days a week.   Maintain a healthy weight. The body mass index (BMI) is a screening tool to identify possible weight problems. It provides an estimate of body fat based on height and weight. Your caregiver can help determine your BMI, and can help you achieve or maintain a healthy weight.For adults 20 years and older:   A BMI below 18.5 is considered underweight.   A BMI of 18.5 to 24.9 is normal.   A BMI of 25 to 29.9 is considered overweight.   A BMI of 30 and above is  considered obese.   Maintain normal blood lipids and cholesterol levels by exercising and minimizing your intake of saturated fat. Eat a balanced diet with plenty of fruit and vegetables. Blood tests for lipids and cholesterol should begin at age 20 and be repeated every 5 years. If your lipid or cholesterol levels are high, you are over 50, or you are at high risk for heart disease, you may need your cholesterol levels checked more frequently.Ongoing high lipid and cholesterol levels should be treated with medicines if diet and exercise are not effective.   If you smoke, find out from your caregiver how to quit. If you do not use tobacco, do not start.   If you are pregnant, do not drink alcohol. If you are breastfeeding, be very cautious about drinking alcohol. If you are not pregnant and choose to drink alcohol, do not exceed 1 drink per day. One drink is considered to be 12 ounces (355 mL) of beer, 5 ounces (148 mL) of wine, or 1.5 ounces (44 mL) of liquor.   Avoid use of street drugs. Do not share needles with anyone. Ask for help if you need support or instructions about stopping the use of drugs.   High blood pressure causes heart disease and increases the risk of stroke. Your blood pressure should be checked at least every 1 to 2 years. Ongoing high blood pressure should be treated with medicines if weight loss and exercise are not effective.   If you are 55 to 54   years old, ask your caregiver if you should take aspirin to prevent strokes.   Diabetes screening involves taking a blood sample to check your fasting blood sugar level. This should be done once every 3 years, after age 45, if you are within normal weight and without risk factors for diabetes. Testing should be considered at a younger age or be carried out more frequently if you are overweight and have at least 1 risk factor for diabetes.   Breast cancer screening is essential preventive care for women. You should practice "breast  self-awareness." This means understanding the normal appearance and feel of your breasts and may include breast self-examination. Any changes detected, no matter how small, should be reported to a caregiver. Women in their 20s and 30s should have a clinical breast exam (CBE) by a caregiver as part of a regular health exam every 1 to 3 years. After age 40, women should have a CBE every year. Starting at age 40, women should consider having a mammography (breast X-ray test) every year. Women who have a family history of breast cancer should talk to their caregiver about genetic screening. Women at a high risk of breast cancer should talk to their caregivers about having magnetic resonance imaging (MRI) and a mammography every year.   The Pap test is a screening test for cervical cancer. A Pap test can show cell changes on the cervix that might become cervical cancer if left untreated. A Pap test is a procedure in which cells are obtained and examined from the lower end of the uterus (cervix).   Women should have a Pap test starting at age 21.   Between ages 21 and 29, Pap tests should be repeated every 2 years.   Beginning at age 30, you should have a Pap test every 3 years as long as the past 3 Pap tests have been normal.   Some women have medical problems that increase the chance of getting cervical cancer. Talk to your caregiver about these problems. It is especially important to talk to your caregiver if a new problem develops soon after your last Pap test. In these cases, your caregiver may recommend more frequent screening and Pap tests.   The above recommendations are the same for women who have or have not gotten the vaccine for human papillomavirus (HPV).   If you had a hysterectomy for a problem that was not cancer or a condition that could lead to cancer, then you no longer need Pap tests. Even if you no longer need a Pap test, a regular exam is a good idea to make sure no other problems are  starting.   If you are between ages 65 and 70, and you have had normal Pap tests going back 10 years, you no longer need Pap tests. Even if you no longer need a Pap test, a regular exam is a good idea to make sure no other problems are starting.   If you have had past treatment for cervical cancer or a condition that could lead to cancer, you need Pap tests and screening for cancer for at least 20 years after your treatment.   If Pap tests have been discontinued, risk factors (such as a new sexual partner) need to be reassessed to determine if screening should be resumed.   The HPV test is an additional test that may be used for cervical cancer screening. The HPV test looks for the virus that can cause the cell changes on the cervix.   The cells collected during the Pap test can be tested for HPV. The HPV test could be used to screen women aged 30 years and older, and should be used in women of any age who have unclear Pap test results. After the age of 30, women should have HPV testing at the same frequency as a Pap test.   Colorectal cancer can be detected and often prevented. Most routine colorectal cancer screening begins at the age of 50 and continues through age 75. However, your caregiver may recommend screening at an earlier age if you have risk factors for colon cancer. On a yearly basis, your caregiver may provide home test kits to check for hidden blood in the stool. Use of a small camera at the end of a tube, to directly examine the colon (sigmoidoscopy or colonoscopy), can detect the earliest forms of colorectal cancer. Talk to your caregiver about this at age 50, when routine screening begins. Direct examination of the colon should be repeated every 5 to 10 years through age 75, unless early forms of pre-cancerous polyps or small growths are found.   Hepatitis C blood testing is recommended for all people born from 1945 through 1965 and any individual with known risks for hepatitis C.    Practice safe sex. Use condoms and avoid high-risk sexual practices to reduce the spread of sexually transmitted infections (STIs). STIs include gonorrhea, chlamydia, syphilis, trichomonas, herpes, HPV, and human immunodeficiency virus (HIV). Herpes, HIV, and HPV are viral illnesses that have no cure. They can result in disability, cancer, and death. Sexually active women aged 25 and younger should be checked for chlamydia. Older women with new or multiple partners should also be tested for chlamydia. Testing for other STIs is recommended if you are sexually active and at increased risk.   Osteoporosis is a disease in which the bones lose minerals and strength with aging. This can result in serious bone fractures. The risk of osteoporosis can be identified using a bone density scan. Women ages 65 and over and women at risk for fractures or osteoporosis should discuss screening with their caregivers. Ask your caregiver whether you should take a calcium supplement or vitamin D to reduce the rate of osteoporosis.   Menopause can be associated with physical symptoms and risks. Hormone replacement therapy is available to decrease symptoms and risks. You should talk to your caregiver about whether hormone replacement therapy is right for you.   Use sunscreen with sun protection factor (SPF) of 30 or more. Apply sunscreen liberally and repeatedly throughout the day. You should seek shade when your shadow is shorter than you. Protect yourself by wearing long sleeves, pants, a wide-brimmed hat, and sunglasses year round, whenever you are outdoors.   Once a month, do a whole body skin exam, using a mirror to look at the skin on your back. Notify your caregiver of new moles, moles that have irregular borders, moles that are larger than a pencil eraser, or moles that have changed in shape or color.   Stay current with required immunizations.   Influenza. You need a dose every fall (or winter). The composition of  the flu vaccine changes each year, so being vaccinated once is not enough.   Pneumococcal polysaccharide. You need 1 to 2 doses if you smoke cigarettes or if you have certain chronic medical conditions. You need 1 dose at age 65 (or older) if you have never been vaccinated.   Tetanus, diphtheria, pertussis (Tdap, Td). Get 1 dose of   Tdap vaccine if you are younger than age 65, are over 65 and have contact with an infant, are a healthcare worker, are pregnant, or simply want to be protected from whooping cough. After that, you need a Td booster dose every 10 years. Consult your caregiver if you have not had at least 3 tetanus and diphtheria-containing shots sometime in your life or have a deep or dirty wound.   HPV. You need this vaccine if you are a woman age 26 or younger. The vaccine is given in 3 doses over 6 months.   Measles, mumps, rubella (MMR). You need at least 1 dose of MMR if you were born in 1957 or later. You may also need a second dose.   Meningococcal. If you are age 19 to 21 and a first-year college student living in a residence hall, or have one of several medical conditions, you need to get vaccinated against meningococcal disease. You may also need additional booster doses.   Zoster (shingles). If you are age 60 or older, you should get this vaccine.   Varicella (chickenpox). If you have never had chickenpox or you were vaccinated but received only 1 dose, talk to your caregiver to find out if you need this vaccine.   Hepatitis A. You need this vaccine if you have a specific risk factor for hepatitis A virus infection or you simply wish to be protected from this disease. The vaccine is usually given as 2 doses, 6 to 18 months apart.   Hepatitis B. You need this vaccine if you have a specific risk factor for hepatitis B virus infection or you simply wish to be protected from this disease. The vaccine is given in 3 doses, usually over 6 months.  Preventive Services /  Frequency Ages 19 to 39  Blood pressure check.** / Every 1 to 2 years.   Lipid and cholesterol check.** / Every 5 years beginning at age 20.   Clinical breast exam.** / Every 3 years for women in their 20s and 30s.   Pap test.** / Every 2 years from ages 21 through 29. Every 3 years starting at age 30 through age 65 or 70 with a history of 3 consecutive normal Pap tests.   HPV screening.** / Every 3 years from ages 30 through ages 65 to 70 with a history of 3 consecutive normal Pap tests.   Hepatitis C blood test.** / For any individual with known risks for hepatitis C.   Skin self-exam. / Monthly.   Influenza immunization.** / Every year.   Pneumococcal polysaccharide immunization.** / 1 to 2 doses if you smoke cigarettes or if you have certain chronic medical conditions.   Tetanus, diphtheria, pertussis (Tdap, Td) immunization. / A one-time dose of Tdap vaccine. After that, you need a Td booster dose every 10 years.   HPV immunization. / 3 doses over 6 months, if you are 26 and younger.   Measles, mumps, rubella (MMR) immunization. / You need at least 1 dose of MMR if you were born in 1957 or later. You may also need a second dose.   Meningococcal immunization. / 1 dose if you are age 19 to 21 and a first-year college student living in a residence hall, or have one of several medical conditions, you need to get vaccinated against meningococcal disease. You may also need additional booster doses.   Varicella immunization.** / Consult your caregiver.   Hepatitis A immunization.** / Consult your caregiver. 2 doses, 6 to 18 months   apart.   Hepatitis B immunization.** / Consult your caregiver. 3 doses usually over 6 months.  Ages 40 to 64  Blood pressure check.** / Every 1 to 2 years.   Lipid and cholesterol check.** / Every 5 years beginning at age 20.   Clinical breast exam.** / Every year after age 40.   Mammogram.** / Every year beginning at age 40 and continuing for as  long as you are in good health. Consult with your caregiver.   Pap test.** / Every 3 years starting at age 30 through age 65 or 70 with a history of 3 consecutive normal Pap tests.   HPV screening.** / Every 3 years from ages 30 through ages 65 to 70 with a history of 3 consecutive normal Pap tests.   Fecal occult blood test (FOBT) of stool. / Every year beginning at age 50 and continuing until age 75. You may not need to do this test if you get a colonoscopy every 10 years.   Flexible sigmoidoscopy or colonoscopy.** / Every 5 years for a flexible sigmoidoscopy or every 10 years for a colonoscopy beginning at age 50 and continuing until age 75.   Hepatitis C blood test.** / For all people born from 1945 through 1965 and any individual with known risks for hepatitis C.   Skin self-exam. / Monthly.   Influenza immunization.** / Every year.   Pneumococcal polysaccharide immunization.** / 1 to 2 doses if you smoke cigarettes or if you have certain chronic medical conditions.   Tetanus, diphtheria, pertussis (Tdap, Td) immunization.** / A one-time dose of Tdap vaccine. After that, you need a Td booster dose every 10 years.   Measles, mumps, rubella (MMR) immunization. / You need at least 1 dose of MMR if you were born in 1957 or later. You may also need a second dose.   Varicella immunization.** / Consult your caregiver.   Meningococcal immunization.** / Consult your caregiver.   Hepatitis A immunization.** / Consult your caregiver. 2 doses, 6 to 18 months apart.   Hepatitis B immunization.** / Consult your caregiver. 3 doses, usually over 6 months.  Ages 65 and over  Blood pressure check.** / Every 1 to 2 years.   Lipid and cholesterol check.** / Every 5 years beginning at age 20.   Clinical breast exam.** / Every year after age 40.   Mammogram.** / Every year beginning at age 40 and continuing for as long as you are in good health. Consult with your caregiver.   Pap test.** /  Every 3 years starting at age 30 through age 65 or 70 with a 3 consecutive normal Pap tests. Testing can be stopped between 65 and 70 with 3 consecutive normal Pap tests and no abnormal Pap or HPV tests in the past 10 years.   HPV screening.** / Every 3 years from ages 30 through ages 65 or 70 with a history of 3 consecutive normal Pap tests. Testing can be stopped between 65 and 70 with 3 consecutive normal Pap tests and no abnormal Pap or HPV tests in the past 10 years.   Fecal occult blood test (FOBT) of stool. / Every year beginning at age 50 and continuing until age 75. You may not need to do this test if you get a colonoscopy every 10 years.   Flexible sigmoidoscopy or colonoscopy.** / Every 5 years for a flexible sigmoidoscopy or every 10 years for a colonoscopy beginning at age 50 and continuing until age 75.   Hepatitis   C blood test.** / For all people born from 1945 through 1965 and any individual with known risks for hepatitis C.   Osteoporosis screening.** / A one-time screening for women ages 65 and over and women at risk for fractures or osteoporosis.   Skin self-exam. / Monthly.   Influenza immunization.** / Every year.   Pneumococcal polysaccharide immunization.** / 1 dose at age 65 (or older) if you have never been vaccinated.   Tetanus, diphtheria, pertussis (Tdap, Td) immunization. / A one-time dose of Tdap vaccine if you are over 65 and have contact with an infant, are a healthcare worker, or simply want to be protected from whooping cough. After that, you need a Td booster dose every 10 years.   Varicella immunization.** / Consult your caregiver.   Meningococcal immunization.** / Consult your caregiver.   Hepatitis A immunization.** / Consult your caregiver. 2 doses, 6 to 18 months apart.   Hepatitis B immunization.** / Check with your caregiver. 3 doses, usually over 6 months.  ** Family history and personal history of risk and conditions may change your caregiver's  recommendations. Document Released: 04/05/2001 Document Revised: 01/27/2011 Document Reviewed: 07/05/2010 [employer] Patient Information 2012 [employer]. 

## 2011-09-16 NOTE — Assessment & Plan Note (Signed)
Pap smears normal since radiation

## 2011-09-20 ENCOUNTER — Ambulatory Visit
Admission: RE | Admit: 2011-09-20 | Discharge: 2011-09-20 | Disposition: A | Discharge status: Home / Self Care | Payer: BC Managed Care – PPO | Source: Ambulatory Visit | Attending: Oncology | Admitting: Oncology

## 2011-09-20 DIAGNOSIS — M8580 Other specified disorders of bone density and structure, unspecified site: Secondary | ICD-10-CM

## 2011-09-20 DIAGNOSIS — Z1231 Encounter for screening mammogram for malignant neoplasm of breast: Secondary | ICD-10-CM

## 2011-09-21 ENCOUNTER — Other Ambulatory Visit (INDEPENDENT_AMBULATORY_CARE_PROVIDER_SITE_OTHER): Payer: BC Managed Care – PPO

## 2011-09-21 DIAGNOSIS — E039 Hypothyroidism, unspecified: Secondary | ICD-10-CM

## 2011-09-21 DIAGNOSIS — Z0000 Encounter for general adult medical examination without abnormal findings: Secondary | ICD-10-CM

## 2011-09-21 LAB — CBC WITH DIFFERENTIAL/PLATELET
Basophils Absolute: 0 10*3/uL (ref 0.0–0.1)
Eosinophils Absolute: 0.3 10*3/uL (ref 0.0–0.7)
Lymphocytes Relative: 26.7 % (ref 12.0–46.0)
MCHC: 33.9 g/dL (ref 30.0–36.0)
Neutrophils Relative %: 56.1 % (ref 43.0–77.0)
RBC: 3.85 Mil/uL — ABNORMAL LOW (ref 3.87–5.11)
RDW: 12.5 % (ref 11.5–14.6)

## 2011-09-21 LAB — URINALYSIS
Leukocytes, UA: NEGATIVE
Specific Gravity, Urine: 1.01 (ref 1.000–1.030)
Urobilinogen, UA: 0.2 (ref 0.0–1.0)

## 2011-09-21 LAB — LIPID PANEL
Cholesterol: 158 mg/dL (ref 0–200)
Total CHOL/HDL Ratio: 2
Triglycerides: 54 mg/dL (ref 0.0–149.0)

## 2011-09-21 LAB — HEPATIC FUNCTION PANEL
ALT: 21 U/L (ref 0–35)
AST: 24 U/L (ref 0–37)
Albumin: 4.2 g/dL (ref 3.5–5.2)
Alkaline Phosphatase: 37 U/L — ABNORMAL LOW (ref 39–117)

## 2011-09-21 LAB — T3, FREE: T3, Free: 2.7 pg/mL (ref 2.3–4.2)

## 2011-09-21 LAB — BASIC METABOLIC PANEL
CO2: 29 mEq/L (ref 19–32)
Calcium: 9.4 mg/dL (ref 8.4–10.5)
Creatinine, Ser: 0.4 mg/dL (ref 0.4–1.2)
Glucose, Bld: 72 mg/dL (ref 70–99)

## 2011-09-21 LAB — TSH: TSH: 4.6 u[IU]/mL (ref 0.35–5.50)

## 2011-09-21 LAB — T4, FREE: Free T4: 0.84 ng/dL (ref 0.60–1.60)

## 2011-10-05 ENCOUNTER — Telehealth: Payer: Self-pay | Admitting: *Deleted

## 2011-10-10 NOTE — Telephone Encounter (Signed)
error 

## 2011-10-19 ENCOUNTER — Encounter: Payer: Self-pay | Admitting: Family Medicine

## 2011-10-20 ENCOUNTER — Encounter: Payer: Self-pay | Admitting: *Deleted

## 2011-11-28 ENCOUNTER — Other Ambulatory Visit: Payer: Self-pay

## 2011-11-28 MED ORDER — LEVOTHYROXINE SODIUM 25 MCG PO TABS: 25.0000 ug | ORAL_TABLET | Freq: Every day | ORAL | Status: DC

## 2011-11-28 NOTE — Telephone Encounter (Signed)
New pt. Last OV 09/13/11. last 06/22/10 #[id number] refill 3. Never been filled by you. Need okay. Plz advise    MW

## 2011-12-30 ENCOUNTER — Other Ambulatory Visit (HOSPITAL_BASED_OUTPATIENT_CLINIC_OR_DEPARTMENT_OTHER): Payer: BC Managed Care – PPO | Admitting: Lab

## 2011-12-30 DIAGNOSIS — C50919 Malignant neoplasm of unspecified site of unspecified female breast: Secondary | ICD-10-CM

## 2011-12-30 LAB — CBC WITH DIFFERENTIAL/PLATELET
BASO%: 0.5 % (ref 0.0–2.0)
EOS%: 3.2 % (ref 0.0–7.0)
MCH: 33.7 pg (ref 25.1–34.0)
MCHC: 34.6 g/dL (ref 31.5–36.0)
NEUT%: 68 % (ref 38.4–76.8)
RDW: 12.6 % (ref 11.2–14.5)
lymph#: 0.9 10*3/uL (ref 0.9–3.3)

## 2011-12-30 LAB — COMPREHENSIVE METABOLIC PANEL (CC13)
Alkaline Phosphatase: 51 U/L (ref 40–150)
Creatinine: 0.7 mg/dL (ref 0.6–1.1)
Glucose: 81 mg/dl (ref 70–99)
Sodium: 140 mEq/L (ref 136–145)
Total Bilirubin: 0.38 mg/dL (ref 0.20–1.20)
Total Protein: 6.6 g/dL (ref 6.4–8.3)

## 2011-12-31 LAB — VITAMIN D 25 HYDROXY (VIT D DEFICIENCY, FRACTURES): Vit D, 25-Hydroxy: 52 ng/mL (ref 30–89)

## 2012-01-05 ENCOUNTER — Telehealth: Payer: Self-pay | Admitting: *Deleted

## 2012-01-05 ENCOUNTER — Ambulatory Visit (HOSPITAL_BASED_OUTPATIENT_CLINIC_OR_DEPARTMENT_OTHER): Payer: BC Managed Care – PPO | Admitting: Oncology

## 2012-01-05 VITALS — BP 109/72 | HR 85 | Temp 97.9°F | Resp 20 | Ht 67.0 in | Wt 115.7 lb

## 2012-01-05 DIAGNOSIS — C50919 Malignant neoplasm of unspecified site of unspecified female breast: Secondary | ICD-10-CM

## 2012-01-05 DIAGNOSIS — Z170 Estrogen receptor positive status [ER+]: Secondary | ICD-10-CM

## 2012-01-05 DIAGNOSIS — C50911 Malignant neoplasm of unspecified site of right female breast: Secondary | ICD-10-CM

## 2012-01-05 MED ORDER — ANASTROZOLE 1 MG PO TABS: 1.0000 mg | ORAL_TABLET | Freq: Every day | ORAL | Status: DC

## 2012-01-05 NOTE — Telephone Encounter (Signed)
Gave patient appointments for 2014

## 2012-01-05 NOTE — Progress Notes (Signed)
ID: Kathryn Mcdaniel   DOB: [birth year]  MR#: [id number]  WUJ#:[id number]  PCP: Loreen Freud, DO GYN:  SU:  OTHER MD:   HISTORY OF PRESENT ILLNESS: Kathryn Mcdaniel does have very dense breasts, and so her mammogram on 06/09/2005 was negative.  Early in September, she noted a mass in her right breast, which she did not recognize as being there previously.  This is very astute because she has pretty lumpy breasts bilaterally, being premenopausal.  She brought this to Dr. Belva Crome attention and she was set up for diagnostic mammography at the [facility] and again the mammogram, this time with additional views, was negative.  Nevertheless, an ultrasound obtained the same day (10/31/2005) showed a 2.6-cm hypoechoic mass highly suspicious for malignancy.  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  The proliferation fraction was elevated at 29%, HER2-neu was 2+, but there was no amplification by FISH.  The patient was referred to Dr. Derrell Lolling for further evaluation and on 11/10/2005, first a left diagnostic mammogram was obtained, which was negative, and secondly bilateral breast MRIs were obtained.  The left was negative, but on the right there was a 4.8-cm mass and also multifocal/multicentric disease, the next 2 larger masses measuring 3.3 cm and 1.3 cm.    On 11/14/2005, the patient underwent ultrasound-guided biopsy of the mass at 2 o'clock and this showed only atypical ductal hyperplasia ([id number]).  Her subsequent history is as detailed below  INTERVAL HISTORY: Kathryn Mcdaniel returns today for followup of her breast cancer. The interval history is generally unremarkable. She in [place] her work, is taking more medications, and she's exercising regularly by swimming, biking, and walking.  REVIEW OF SYSTEMS: Occasionally she has a little bit of back  pain, perhaps when a big dog that she walks [place] her. This is not constant, it is not new, it is not more intense or more frequent, and she associates it probably correctly with a history of mild to moderate scoliosis. Otherwise a detailed review of systems was entirely negative, and she is tolerating her aromatase inhibitor without any side effects that she is aware of.  PAST MEDICAL HISTORY: Past Medical History  Diagnosis Date  . 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 Personal history of cervical dysplasia   . Unspecified personal history presenting hazards to health     Radiation Therapy  . 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 Basal cell carcinoma of skin     left temple  . Abnormal breast biopsy   . Depression   . Thyroid disease   Significant for basal cell carcinoma removed from her left temple about  2 years ago.  History of cervical dysplasia with positive HPV, status post repeat colposcopy about  2 weeks from now under Dr. Annabell Howells.  History of multiple moles being removed.  History of perhaps as much as 10-pack-year tobacco abuse, the patient quitting more than 20 years ago.  PAST SURGICAL HISTORY: Past Surgical History  Procedure Date  . Breast surgery     right mastectomy  . Breast biopsy   . Nasal sinus surgery     sinus stripping    FAMILY HISTORY Family History  Problem Relation Age of Onset  . Cancer Father     prostate  .  Hypertension Father   . Cancer Brother   . Colon cancer Neg Hx   . Coronary artery disease Neg Hx   . Cancer Other     grandmother, breast cancer  . Breast cancer Paternal Grandmother   . Breast cancer Mother   . Squamous cell carcinoma Mother     sinus  . Diabetes Father   . Stroke Maternal Grandmother   . Transient ischemic attack Mother   The patient's father died at the age of 3 from prostate cancer in the setting of diabetes.   The patient's mother died at the age of 52-she had breast cancer diagnosed in her early 38s and then a contralateral breast cancer later on, but she died from a squamous cell cancer of the nasopharynx.  She also had a stroke and MI, which was the actual cause of death.  The patient had one brother who died in an automobile accident and one sister, Kathryn Mcdaniel, who is present today.  There is no history of breast cancer otherwise in the immediate family, but the patient's father's mother died from breast cancer in her late 9s.  There is no history of ovarian cancer.    GYNECOLOGIC HISTORY: The patient is GX, P0.  She stopped menstruating at the time of chemo.  SOCIAL HISTORY: The patient works as a Airline pilot.  She lives by herself with multiple cats, and 2 parakeets, and a hamster.  Her sister, Kathryn Mcdaniel, lives in [place] is a Engineer, maintenance.    ADVANCED DIRECTIVES:  HEALTH MAINTENANCE: History  Substance Use Topics  . Smoking status: Former Smoker    Quit date: 09/15/1984  . Smokeless tobacco: Never Used  . Alcohol Use: 8.4 oz/week    14 Cans of beer per week     Comment: Occ     Colonoscopy:  PAP:  Bone density:  Lipid panel:  Allergies  Allergen Reactions  . Metoclopramide Hcl     Current Outpatient Prescriptions  Medication Sig Dispense Refill  . ALPRAZolam (XANAX) 0.5 MG tablet Take 0.5 mg by mouth at bedtime as needed.        Marland Kitchen anastrozole (ARIMIDEX) 1 MG tablet Take 1 mg by mouth daily.        . Ascorbic Acid (VITAMIN C) 1000 MG tablet Take 1,000 mg by mouth daily.      . calcium & magnesium carbonates (MYLANTA) 311-232 MG per tablet Take 1 tablet by mouth daily. With zinc       . FLUoxetine (PROZAC) 20 MG capsule Take 60 mg by mouth daily.       Marland Kitchen levothyroxine (LEVOTHROID) 25 MCG tablet Take 1 tablet (25 mcg total) by mouth daily.  90 tablet  3  . Multiple Vitamin (MULTIVITAMIN) tablet Take 1 tablet by mouth daily.      . vitamin E 400 UNIT capsule Take 400 Units by  mouth daily.        OBJECTIVE: Middle-aged white woman who appears well Filed Vitals:   01/05/12 1001  BP: 109/72  Pulse: 85  Temp: 97.9 F (36.6 C)  Resp: 20     Body mass index is 18.12 kg/(m^2).    ECOG FS: 0  Sclerae unicteric Oropharynx clear No cervical or supraclavicular adenopathy Lungs no rales or rhonchi Heart regular rate and rhythm Abd benign MSK no focal spinal tenderness, no peripheral edema Neuro: nonfocal Breasts: The right breast is status post mastectomy. There is no evidence of local recurrence. The left breast is unremarkable.   LAB  RESULTS: Lab Results  Component Value Date   WBC 4.3 12/30/2011   NEUTROABS 2.9 12/30/2011   HGB 12.8 12/30/2011   HCT 37.1 12/30/2011   MCV 97.3 12/30/2011   PLT 173 12/30/2011      Chemistry      Component Value Date/Time   NA 140 12/30/2011 1011   NA 138 09/21/2011 0822   K 4.6 12/30/2011 1011   K 3.8 09/21/2011 0822   CL 106 12/30/2011 1011   CL 102 09/21/2011 0822   CO2 29 12/30/2011 1011   CO2 29 09/21/2011 0822   BUN 17.0 12/30/2011 1011   BUN 14 09/21/2011 0822   CREATININE 0.7 12/30/2011 1011   CREATININE 0.4 09/21/2011 0822      Component Value Date/Time   CALCIUM 9.8 12/30/2011 1011   CALCIUM 9.4 09/21/2011 0822   ALKPHOS 51 12/30/2011 1011   ALKPHOS 37* 09/21/2011 0822   AST 31 12/30/2011 1011   AST 24 09/21/2011 0822   ALT 31 12/30/2011 1011   ALT 21 09/21/2011 0822   BILITOT 0.38 12/30/2011 1011   BILITOT 0.7 09/21/2011 0822       Lab Results  Component Value Date   LABCA2 13 12/30/2011    No components found with this basename: [id number]    No results found for this basename: INR:1;PROTIME:1 in the last 168 hours  Urinalysis    Component Value Date/Time   COLORURINE LT. YELLOW 09/21/2011 0822   APPEARANCEUR CLEAR 09/21/2011 0822   LABSPEC 1.010 09/21/2011 0822   PHURINE 7.5 09/21/2011 0822   HGBUR NEGATIVE 09/21/2011 0822   BILIRUBINUR NEGATIVE 09/21/2011 0822   KETONESUR NEGATIVE 09/21/2011 0822    UROBILINOGEN 0.2 09/21/2011 0822   NITRITE NEGATIVE 09/21/2011 0822   LEUKOCYTESUR NEGATIVE 09/21/2011 5409    STUDIES: We reviewed her mammography and bone density both obtained this year  ASSESSMENT: 56 y.o. Kathryn Mcdaniel woman status post right breast biopsy September of 2007 for a grade 2 invasive ductal carcinoma measuring between 3 and 5 cm by radiology, estrogen and progesterone receptor positive HER-2/neu nonamplified with an MIB-1-1 of 29%. Neoadjuvant E. she received docetaxel x4 and dose dense cyclophosphamide and doxorubicin x4, followed by right mastectomy and sentinel lymph node sampling March of 2008 for a 1.5 cm residual tumor involving 2/5 sentinel lymph nodes. After radiation she started tamoxifen with poor tolerance. She finally went on anastrozole May of 2009.    PLAN: She will complete 5 years of an estradiol may of 2014, and likely we will discharge her from followup at that point. She knows to call for any problems that may develop before the next visit.   MAGRINAT,GUSTAV C    01/05/2012

## 2012-01-09 ENCOUNTER — Ambulatory Visit: Payer: BC Managed Care – PPO | Admitting: Oncology

## 2012-01-24 ENCOUNTER — Other Ambulatory Visit: Payer: Self-pay | Admitting: Dermatology

## 2012-04-07 ENCOUNTER — Other Ambulatory Visit: Payer: Self-pay

## 2012-05-04 ENCOUNTER — Ambulatory Visit (INDEPENDENT_AMBULATORY_CARE_PROVIDER_SITE_OTHER): Payer: BC Managed Care – PPO | Admitting: Family Medicine

## 2012-05-04 ENCOUNTER — Encounter: Payer: Self-pay | Admitting: Family Medicine

## 2012-05-04 VITALS — BP 94/60 | HR 80 | Temp 99.5°F | Wt 117.6 lb

## 2012-05-04 DIAGNOSIS — IMO0001 Reserved for inherently not codable concepts without codable children: Secondary | ICD-10-CM

## 2012-05-04 DIAGNOSIS — R5381 Other malaise: Secondary | ICD-10-CM

## 2012-05-04 DIAGNOSIS — Z780 Asymptomatic menopausal state: Secondary | ICD-10-CM

## 2012-05-04 LAB — HEPATIC FUNCTION PANEL
AST: 30 U/L (ref 0–37)
Albumin: 4.1 g/dL (ref 3.5–5.2)
Alkaline Phosphatase: 44 U/L (ref 39–117)
Indirect Bilirubin: 0.3 mg/dL (ref 0.0–0.9)
Total Protein: 6.2 g/dL (ref 6.0–8.3)

## 2012-05-04 LAB — CBC WITH DIFFERENTIAL/PLATELET
Eosinophils Absolute: 0.1 10*3/uL (ref 0.0–0.7)
Eosinophils Relative: 3 % (ref 0–5)
HCT: 35.1 % — ABNORMAL LOW (ref 36.0–46.0)
Hemoglobin: 12 g/dL (ref 12.0–15.0)
Lymphocytes Relative: 12 % (ref 12–46)
Lymphs Abs: 0.5 10*3/uL — ABNORMAL LOW (ref 0.7–4.0)
MCH: 30.9 pg (ref 26.0–34.0)
MCV: 90.5 fL (ref 78.0–100.0)
Monocytes Absolute: 0.3 10*3/uL (ref 0.1–1.0)
Monocytes Relative: 8 % (ref 3–12)
RBC: 3.88 MIL/uL (ref 3.87–5.11)

## 2012-05-04 LAB — BASIC METABOLIC PANEL
BUN: 12 mg/dL (ref 6–23)
Chloride: 104 mEq/L (ref 96–112)
Potassium: 4.2 mEq/L (ref 3.5–5.3)
Sodium: 137 mEq/L (ref 135–145)

## 2012-05-04 MED ORDER — CALCIUM CARBONATE-VITAMIN D 600-400 MG-UNIT PO TABS: 1.0000 | ORAL_TABLET | Freq: Two times a day (BID) | ORAL | Status: AC

## 2012-05-04 NOTE — Progress Notes (Signed)
  Subjective:    Patient ID: Kathryn Mcdaniel, female    DOB: 10-22-1955, 57 y.o.   MRN: [id number]  HPI    Review of Systems     Objective:   Physical Exam        Assessment & Plan:

## 2012-05-04 NOTE — Patient Instructions (Addendum)

## 2012-05-06 ENCOUNTER — Encounter: Payer: Self-pay | Admitting: Family Medicine

## 2012-05-07 ENCOUNTER — Other Ambulatory Visit: Payer: Self-pay

## 2012-05-07 MED ORDER — LEVOTHYROXINE SODIUM 50 MCG PO TABS: ORAL_TABLET | ORAL | Status: DC

## 2012-05-07 NOTE — Telephone Encounter (Signed)
Pt left VM and sent Mychart stating she needs to clarify how to take med. Please clarify how Pt is to take med:  Notes Recorded by Lelon Perla, DO on 05/05/2012 at 2:20 PM Increase synthroid to 50 mcg #30 1 po qd 2 refills  Recheck 2 months TSH Dx hypothyroidism   Entered by Lelon Perla, DO at 05/05/2012 2:20 PM Vita D is pending still but we do need to increase your synthroid.  Take 2 of your 50 mcg tabs and I'll have the CMA send a prescription to pharnacy Monday.  We will need to recheck it in 2 months--- hopefully you will start feeling better soon

## 2012-05-09 LAB — VITAMIN D 1,25 DIHYDROXY: Vitamin D3 1, 25 (OH)2: 66 pg/mL

## 2012-06-28 ENCOUNTER — Other Ambulatory Visit (HOSPITAL_BASED_OUTPATIENT_CLINIC_OR_DEPARTMENT_OTHER): Payer: BC Managed Care – PPO

## 2012-06-28 DIAGNOSIS — C50919 Malignant neoplasm of unspecified site of unspecified female breast: Secondary | ICD-10-CM

## 2012-06-28 DIAGNOSIS — C50911 Malignant neoplasm of unspecified site of right female breast: Secondary | ICD-10-CM

## 2012-06-28 LAB — CBC WITH DIFFERENTIAL/PLATELET
Basophils Absolute: 0 10*3/uL (ref 0.0–0.1)
EOS%: 5.9 % (ref 0.0–7.0)
Eosinophils Absolute: 0.3 10*3/uL (ref 0.0–0.5)
HCT: 36.6 % (ref 34.8–46.6)
HGB: 12.6 g/dL (ref 11.6–15.9)
MCH: 32.4 pg (ref 25.1–34.0)
MCV: 94.1 fL (ref 79.5–101.0)
MONO%: 7.1 % (ref 0.0–14.0)
NEUT#: 3 10*3/uL (ref 1.5–6.5)
NEUT%: 64.6 % (ref 38.4–76.8)
RDW: 12.8 % (ref 11.2–14.5)
lymph#: 1 10*3/uL (ref 0.9–3.3)

## 2012-06-28 LAB — COMPREHENSIVE METABOLIC PANEL (CC13)
AST: 28 U/L (ref 5–34)
Albumin: 3.7 g/dL (ref 3.5–5.0)
BUN: 18.8 mg/dL (ref 7.0–26.0)
Calcium: 9.4 mg/dL (ref 8.4–10.4)
Chloride: 105 mEq/L (ref 98–107)
Creatinine: 0.7 mg/dL (ref 0.6–1.1)
Glucose: 77 mg/dl (ref 70–99)
Potassium: 4.5 mEq/L (ref 3.5–5.1)

## 2012-07-05 ENCOUNTER — Ambulatory Visit (HOSPITAL_BASED_OUTPATIENT_CLINIC_OR_DEPARTMENT_OTHER): Payer: BC Managed Care – PPO | Admitting: Oncology

## 2012-07-05 VITALS — BP 113/75 | HR 81 | Temp 98.8°F | Resp 20 | Ht 67.0 in | Wt 112.9 lb

## 2012-07-05 DIAGNOSIS — Z853 Personal history of malignant neoplasm of breast: Secondary | ICD-10-CM

## 2012-07-05 DIAGNOSIS — C50911 Malignant neoplasm of unspecified site of right female breast: Secondary | ICD-10-CM

## 2012-07-05 DIAGNOSIS — Z9011 Acquired absence of right breast and nipple: Secondary | ICD-10-CM

## 2012-07-05 DIAGNOSIS — Z9010 Acquired absence of unspecified breast and nipple: Secondary | ICD-10-CM

## 2012-07-05 DIAGNOSIS — C50919 Malignant neoplasm of unspecified site of unspecified female breast: Secondary | ICD-10-CM

## 2012-07-05 NOTE — Progress Notes (Signed)
ID: Kathryn Mcdaniel   DOB: [birth year]  MR#: [id number]  WUJ#:[id number]  PCP: Kathryn Freud, DO GYN: Kathryn Mcdaniel SU: Kathryn Mcdaniel OTHER MD: Kathryn Mcdaniel, Kathryn Mcdaniel, Kathryn Mcdaniel   HISTORY OF PRESENT ILLNESS: Kathryn Mcdaniel does have very dense breasts, and so her mammogram on 06/09/2005 was negative.  Early in September, she noted a mass in her right breast, which she did not recognize as being there previously.  This is very astute because she has pretty lumpy breasts bilaterally, being premenopausal.  She brought this to Dr. Belva Mcdaniel attention and she was set up for diagnostic mammography at the [facility] and again the mammogram, this time with additional views, was negative.  Nevertheless, an ultrasound obtained the same day (10/31/2005) showed a 2.6-cm hypoechoic mass highly suspicious for malignancy.  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  The proliferation fraction was elevated at 29%, HER2-neu was 2+, but there was no amplification by FISH.  The patient was referred to Kathryn Mcdaniel for further evaluation and on 11/10/2005, first a left diagnostic mammogram was obtained, which was negative, and secondly bilateral breast MRIs were obtained.  The left was negative, but on the right there was a 4.8-cm mass and also multifocal/multicentric disease, the next 2 larger masses measuring 3.3 cm and 1.3 cm.    On 11/14/2005, the patient underwent ultrasound-guided biopsy of the mass at 2 o'clock and this showed only atypical ductal hyperplasia ([id number]).  Her subsequent history is as detailed below  INTERVAL HISTORY: Kathryn Mcdaniel is doing "terrific". She was walking her dog in the woods and thinking how happy she was and did not notice a branch which smacked her on the nose where she has a cut. Otherwise she continues to enjoy her work,  does a lot of gardening, and is planning to do some travel with friends.  REVIEW OF SYSTEMS: She has tolerated the anastrozole with no significant side effects. She was feeling a little sluggish, had her Synthroid increased to 50 mcg, and is now doing fine. A detailed review of systems was otherwise entirely negative.  PAST MEDICAL HISTORY: Past Medical History  Diagnosis Date  . 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 Personal history of cervical dysplasia   . Unspecified personal history presenting hazards to health     Radiation Therapy  . 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 Basal cell carcinoma of skin     left temple  . Abnormal breast biopsy   . Depression   . Thyroid disease   Significant for basal cell carcinoma removed from her left temple about  2 years ago.  History of cervical dysplasia with positive HPV, status post repeat colposcopy about  2 weeks from now under Dr. Annabell Mcdaniel.  History of multiple moles being removed.  History of perhaps as much as 10-pack-year tobacco abuse, the patient quitting more than 20 years ago.  PAST SURGICAL HISTORY: Past Surgical History  Procedure Laterality Date  . Breast surgery      right mastectomy  . Breast biopsy    . Nasal sinus surgery      sinus stripping    FAMILY HISTORY Family History  Problem Relation Age of Onset  . Cancer Father     prostate  . Hypertension Father   .  Cancer Brother   . Colon cancer Neg Hx   . Coronary artery disease Neg Hx   . Cancer Other     grandmother, breast cancer  . Breast cancer Paternal Grandmother   . Breast cancer Mother   . Squamous cell carcinoma Mother     sinus  . Diabetes Father   . Stroke Maternal Grandmother   . Transient ischemic attack Mother   The patient's father died at the age of 59 from prostate cancer in the setting of diabetes.  The patient's mother died at the age  of 45-she had breast cancer diagnosed in her early 91s and then a contralateral breast cancer later on, but she died from a squamous cell cancer of the nasopharynx.  She also had a stroke and MI, which was the actual cause of death.  The patient had one brother who died in an automobile accident and one sister, Kathryn Mcdaniel, who is present today.  There is no history of breast cancer otherwise in the immediate family, but the patient's father's mother died from breast cancer in her late 8s.  There is no history of ovarian cancer.    GYNECOLOGIC HISTORY: The patient is GX, P0.  She stopped menstruating at the time of chemo.  SOCIAL HISTORY: The patient works as a Airline pilot.  She lives by herself with multiple cats, 2 parakeets, and a hamster.  Her sister, Kathryn Mcdaniel, lives in [place] is a Engineer, maintenance.    ADVANCED DIRECTIVES: In place  HEALTH MAINTENANCE: History  Substance Use Topics  . Smoking status: Former Smoker    Quit date: 09/15/1984  . Smokeless tobacco: Never Used  . Alcohol Use: 8.4 oz/week    14 Cans of beer per week     Comment: Occ     Colonoscopy:  PAP:  Bone density: 09/20/2011 showing a normal spine and mild osteopenia at the left femoral neck (T. -1.3)  Lipid panel:  Allergies  Allergen Reactions  . Metoclopramide Hcl     Current Outpatient Prescriptions  Medication Sig Dispense Refill  . ALPRAZolam (XANAX) 0.5 MG tablet Take 0.5 mg by mouth at bedtime as needed.        Marland Kitchen anastrozole (ARIMIDEX) 1 MG tablet Take 1 tablet (1 mg total) by mouth daily.  90 tablet  6  . Ascorbic Acid (VITAMIN C) 1000 MG tablet Take 1,000 mg by mouth daily.      . Calcium Carbonate-Vitamin D (CALTRATE 600+D) 600-400 MG-UNIT per tablet Take 1 tablet by mouth 2 (two) times daily.      Marland Kitchen FLUoxetine (PROZAC) 20 MG capsule Take 60 mg by mouth daily.       Marland Kitchen levothyroxine (LEVOTHROID) 50 MCG tablet 1 tablet by mouth daily  90 tablet  0  . Multiple Vitamin (MULTIVITAMIN) tablet Take 1 tablet  by mouth daily.      . vitamin E 400 UNIT capsule Take 400 Units by mouth daily.       No current facility-administered medications for this visit.    OBJECTIVE: Middle-aged white woman who appears well Filed Vitals:   07/05/12 0948  BP: 113/75  Pulse: 81  Temp: 98.8 F (37.1 C)  Resp: 20     Body mass index is 17.68 kg/(m^2).    ECOG FS: 0  Sclerae unicteric Oropharynx clear; half inch superficial cut across the bridge of the nose, healing well No cervical or supraclavicular adenopathy Lungs no rales or rhonchi Heart regular rate and rhythm Abd benign MSK no focal  spinal tenderness, no peripheral edema Neuro: nonfocal, well oriented, pleasant affect Breasts: The right breast is status post mastectomy. There is no evidence of local recurrence. The right axilla is benign. The left breast is unremarkable.   LAB RESULTS: Lab Results  Component Value Date   WBC 4.7 06/28/2012   NEUTROABS 3.0 06/28/2012   HGB 12.6 06/28/2012   HCT 36.6 06/28/2012   MCV 94.1 06/28/2012   PLT 167 06/28/2012      Chemistry      Component Value Date/Time   NA 141 06/28/2012 1010   NA 137 05/04/2012 1640   K 4.5 06/28/2012 1010   K 4.2 05/04/2012 1640   CL 105 06/28/2012 1010   CL 104 05/04/2012 1640   CO2 29 06/28/2012 1010   CO2 25 05/04/2012 1640   BUN 18.8 06/28/2012 1010   BUN 12 05/04/2012 1640   CREATININE 0.7 06/28/2012 1010   CREATININE 0.48* 05/04/2012 1640   CREATININE 0.4 09/21/2011 0822      Component Value Date/Time   CALCIUM 9.4 06/28/2012 1010   CALCIUM 9.0 05/04/2012 1640   ALKPHOS 50 06/28/2012 1010   ALKPHOS 44 05/04/2012 1640   AST 28 06/28/2012 1010   AST 30 05/04/2012 1640   ALT 22 06/28/2012 1010   ALT 26 05/04/2012 1640   BILITOT 0.36 06/28/2012 1010   BILITOT 0.4 05/04/2012 1640       Lab Results  Component Value Date   LABCA2 13 12/30/2011    No components found with this basename: [id number]    No results found for this basename: INR,  in the last 168 hours  Urinalysis    Component  Value Date/Time   COLORURINE LT. YELLOW 09/21/2011 0822   APPEARANCEUR CLEAR 09/21/2011 0822   LABSPEC 1.010 09/21/2011 0822   PHURINE 7.5 09/21/2011 0822   HGBUR NEGATIVE 09/21/2011 0822   BILIRUBINUR NEGATIVE 09/21/2011 0822   KETONESUR NEGATIVE 09/21/2011 0822   UROBILINOGEN 0.2 09/21/2011 0822   NITRITE NEGATIVE 09/21/2011 0822   LEUKOCYTESUR NEGATIVE 09/21/2011 0822    STUDIES: Most recent left mammography 09/20/2011 was unremarkable.   ASSESSMENT: 57 y.o. Kathryn Mcdaniel woman status post right breast biopsy September of 2007 for a clinical T2 N0, stage IIA  invasive ductal carcinoma, grade 2, estrogen and progesterone receptor positive,  HER-2/neu nonamplified, with an MIB-1-1 of 29%.  (1) Neoadjuvantly she received docetaxel x4 and dose dense cyclophosphamide and doxorubicin x4  (2) underwent right mastectomy and sentinel lymph node sampling March of 2008 for a 1.5 cm residual tumor involving 2/5 sentinel lymph nodes, and so ypT1c ypN1,   (3) received adjuvant radiation, then started tamoxifen with poor tolerance  (4). started anastrozole May of 2009.    PLAN: She has completed 5 years of antiestrogen therapy with an aromatase inhibitor. This is optimal, and we do not have data suggesting that continuing beyond this point would be helpful. Accordingly I am comfortable releasing her back to her primary care physician.  We are not making any further appointments for [facility] here, but she knows we will be glad to see her at any point in the future if the need arises.  MAGRINAT,GUSTAV C    07/05/2012

## 2012-07-06 ENCOUNTER — Other Ambulatory Visit: Payer: Self-pay | Admitting: Oncology

## 2012-07-09 ENCOUNTER — Telehealth: Payer: Self-pay | Admitting: Oncology

## 2012-07-09 NOTE — Telephone Encounter (Signed)
Letter sent to Loreen Freud, DO office from Dr. Darnelle Catalan

## 2012-07-13 ENCOUNTER — Other Ambulatory Visit (INDEPENDENT_AMBULATORY_CARE_PROVIDER_SITE_OTHER): Payer: BC Managed Care – PPO

## 2012-07-13 DIAGNOSIS — E039 Hypothyroidism, unspecified: Secondary | ICD-10-CM

## 2012-07-13 LAB — TSH: TSH: 1.87 u[IU]/mL (ref 0.35–5.50)

## 2012-08-03 ENCOUNTER — Other Ambulatory Visit: Payer: Self-pay | Admitting: Family Medicine

## 2012-09-05 ENCOUNTER — Other Ambulatory Visit: Payer: Self-pay

## 2012-09-05 DIAGNOSIS — Z9011 Acquired absence of right breast and nipple: Secondary | ICD-10-CM

## 2012-09-05 DIAGNOSIS — Z1231 Encounter for screening mammogram for malignant neoplasm of breast: Secondary | ICD-10-CM

## 2012-09-25 ENCOUNTER — Ambulatory Visit: Payer: BC Managed Care – PPO

## 2012-10-01 ENCOUNTER — Ambulatory Visit
Admission: RE | Admit: 2012-10-01 | Discharge: 2012-10-01 | Disposition: A | Discharge status: Home / Self Care | Payer: BC Managed Care – PPO | Source: Ambulatory Visit

## 2012-10-01 DIAGNOSIS — Z1231 Encounter for screening mammogram for malignant neoplasm of breast: Secondary | ICD-10-CM

## 2012-10-01 DIAGNOSIS — Z9011 Acquired absence of right breast and nipple: Secondary | ICD-10-CM

## 2012-12-27 ENCOUNTER — Other Ambulatory Visit: Payer: Self-pay

## 2013-07-19 ENCOUNTER — Ambulatory Visit (INDEPENDENT_AMBULATORY_CARE_PROVIDER_SITE_OTHER): Payer: BC Managed Care – PPO | Admitting: Family Medicine

## 2013-07-19 ENCOUNTER — Encounter: Payer: Self-pay | Admitting: Family Medicine

## 2013-07-19 VITALS — BP 110/70 | HR 71 | Temp 98.5°F | Ht 67.0 in | Wt 115.0 lb

## 2013-07-19 DIAGNOSIS — E039 Hypothyroidism, unspecified: Secondary | ICD-10-CM

## 2013-07-19 DIAGNOSIS — N63 Unspecified lump in unspecified breast: Secondary | ICD-10-CM

## 2013-07-19 DIAGNOSIS — R928 Other abnormal and inconclusive findings on diagnostic imaging of breast: Secondary | ICD-10-CM

## 2013-07-19 NOTE — Progress Notes (Signed)
   Subjective:    Patient ID: Kathryn Mcdaniel, female    DOB: 1955/08/08, 58 y.o.   MRN: [id number]  HPI Pt here c/o nodule that she noticed about 1 week ago.  Pt is requesting an MRI or BCGI.  She also needs her thyroid checked.  She needs a refill of her synthroid.    Review of Systems As above    Objective:   Physical Exam  BP 110/70  Pulse 71  Temp(Src) 98.5 F (36.9 C) (Oral)  Ht 5\' 7"  (1.702 m)  Wt 115 lb (52.164 kg)  BMI 18.01 kg/m2  SpO2 99% General appearance: alert, cooperative, appears stated age and no distress Throat: lips, mucosa, and tongue normal; teeth and gums normal Neck: no adenopathy, supple, symmetrical, trachea midline and thyroid not enlarged, symmetric, no tenderness/mass/nodules Breasts: positive findings: firm and non-tender nodule located on the left upper outer quadrant and nodule r low out nodule---pea sized       Assessment & Plan:  1. Unspecified hypothyroidism Check labs and con't synthroid - Thyroid Panel With TSH; Future  2. Breast nodule--- d/w Dr Jana Hakim--  He advised to have pt go back to surgeon first

## 2013-07-19 NOTE — Patient Instructions (Signed)

## 2013-07-19 NOTE — Progress Notes (Signed)
Pre visit review using our clinic review tool, if applicable. No additional management support is needed unless otherwise documented below in the visit note. 

## 2013-07-20 LAB — THYROID PANEL WITH TSH
FREE THYROXINE INDEX: 2.6 (ref 1.0–3.9)
T3 UPTAKE: 40.3 % — AB (ref 22.5–37.0)
T4 TOTAL: 6.4 ug/dL (ref 5.0–12.5)
TSH: 6.48 u[IU]/mL — ABNORMAL HIGH (ref 0.350–4.500)

## 2013-07-21 ENCOUNTER — Other Ambulatory Visit: Payer: Self-pay | Admitting: Oncology

## 2013-07-22 ENCOUNTER — Other Ambulatory Visit: Payer: Self-pay

## 2013-07-22 MED ORDER — LEVOTHYROXINE SODIUM 75 MCG PO TABS: ORAL_TABLET | ORAL | Status: DC

## 2013-08-04 ENCOUNTER — Other Ambulatory Visit: Payer: Self-pay | Admitting: Oncology

## 2013-08-08 ENCOUNTER — Encounter (INDEPENDENT_AMBULATORY_CARE_PROVIDER_SITE_OTHER): Payer: Self-pay | Admitting: General Surgery

## 2013-08-08 ENCOUNTER — Ambulatory Visit (INDEPENDENT_AMBULATORY_CARE_PROVIDER_SITE_OTHER): Payer: BC Managed Care – PPO | Admitting: General Surgery

## 2013-08-08 VITALS — BP 120/70 | HR 76 | Temp 98.5°F | Resp 18 | Wt 118.0 lb

## 2013-08-08 DIAGNOSIS — C50919 Malignant neoplasm of unspecified site of unspecified female breast: Secondary | ICD-10-CM

## 2013-08-08 DIAGNOSIS — R222 Localized swelling, mass and lump, trunk: Secondary | ICD-10-CM

## 2013-08-08 DIAGNOSIS — C50911 Malignant neoplasm of unspecified site of right female breast: Secondary | ICD-10-CM

## 2013-08-08 NOTE — Progress Notes (Signed)
Patient ID: Kathryn Mcdaniel, female   DOB: Aug 29, 1955, 58 y.o.   MRN: [id number]  No chief complaint on file.  Note: This dictation was prepared with Dragon/digital dictation along with [employer]. Any transcriptional errors that result from this process are unintentional.    HPI Kathryn Mcdaniel is a 58 y.o. female.  She is referred by Dr. Garnet Koyanagi for evaluation of  breast nodules. Dr. Jana Hakim was her oncologist.   She was diagnosed with right breast cancer in 2008. She underwent neoadjuvant chemotherapy for a 4.8 cm invasive cancer. All in March of 2008 she underwent a right total mastectomy and sentinel lymph node biopsy for what turned out to be a 1.5 cm residual tumor and 2/5 sentinel nodes positive. Final staging ypT1c, ypN1.   She  had radiation therapy. She was placed on tamoxifen but did not tolerate that and was then placed on aromatase inhibitor for 5 years. She has  been discharged from Dr. Virgie Dad care in 2014.  Most recent left breast mammogram dated 10/01/2012 looks normal, category 1.  The patient states she has felt a painless nodule of the right anterolateral chest wall below the right mastectomy incision for about one month. She has no complaints about the left breast.On 07/19/2013 she saw Dr. Etter Sjogren who states she felt  a  nodule the right side lower outer quadrant.  Comorbidities include history of cervical dysplasia, right breast cancer, depression.  Family history shows prostate cancer in the father, breast cancer in the mother, breast cancer in paternal grandmother HPI  Past Medical History  Diagnosis Date  . 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 Personal history of cervical dysplasia   . Unspecified personal history presenting hazards to health     Radiation Therapy  . 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 Basal cell carcinoma of skin     left  temple  . Abnormal breast biopsy   . Depression   . Thyroid disease     Past Surgical History  Procedure Laterality Date  . Breast surgery      right mastectomy  . Breast biopsy    . Nasal sinus surgery      sinus stripping    Family History  Problem Relation Age of Onset  . Cancer Father     prostate  . Hypertension Father   . Cancer Brother   . Colon cancer Neg Hx   . Coronary artery disease Neg Hx   . Cancer Other     grandmother, breast cancer  . Breast cancer Paternal Grandmother   . Breast cancer Mother   . Squamous cell carcinoma Mother     sinus  . Diabetes Father   . Stroke Maternal Grandmother   . Transient ischemic attack Mother     Social History History  Substance Use Topics  . Smoking status: Former Smoker    Quit date: 09/15/1984  . Smokeless tobacco: Never Used  . Alcohol Use: 8.4 oz/week    14 Cans of beer per week     Comment: Occ    Allergies  Allergen Reactions  . Metoclopramide Hcl     Current Outpatient Prescriptions  Medication Sig Dispense Refill  . ALPRAZolam (XANAX) 0.5 MG tablet Take 0.5 mg by mouth at bedtime as needed.        . Ascorbic Acid (VITAMIN C) 1000 MG tablet Take 1,000 mg by mouth daily.      Marland Kitchen  Calcium Carbonate-Vitamin D (CALTRATE 600+D) 600-400 MG-UNIT per tablet Take 1 tablet by mouth 2 (two) times daily.      Marland Kitchen FLUoxetine (PROZAC) 20 MG capsule Take 60 mg by mouth daily.       Marland Kitchen levothyroxine (SYNTHROID, LEVOTHROID) 75 MCG tablet TAKE 1 TABLET BY MOUTH DAILY--RECHECK LABS IN 2 MONTHS  90 tablet  0  . Multiple Vitamin (MULTIVITAMIN) tablet Take 1 tablet by mouth daily.      . vitamin E 400 UNIT capsule Take 400 Units by mouth daily.       No current facility-administered medications for this visit.    Review of Systems Review of Systems  Constitutional: Negative for fever, chills and unexpected weight change.  HENT: Negative for congestion, hearing loss, sore throat, trouble swallowing and voice change.     Eyes: Negative for visual disturbance.  Respiratory: Negative for cough and wheezing.   Cardiovascular: Negative for chest pain, palpitations and leg swelling.  Gastrointestinal: Negative for nausea, vomiting, abdominal pain, diarrhea, constipation, blood in stool, abdominal distention and anal bleeding.  Genitourinary: Positive for difficulty urinating. Negative for hematuria and vaginal bleeding.  Musculoskeletal: Negative for arthralgias.  Skin: Negative for rash and wound.  Neurological: Negative for seizures, syncope and headaches.  Hematological: Negative for adenopathy. Does not bruise/bleed easily.  Psychiatric/Behavioral: Negative for confusion.    There were no vitals taken for this visit.  Physical Exam Physical Exam  Constitutional: She is oriented to person, place, and time. She appears well-developed and well-nourished. No distress.  Thin. Good spirits. Friendly and cooperative.  HENT:  Head: Normocephalic and atraumatic.  Nose: Nose normal.  Mouth/Throat: No oropharyngeal exudate.  Eyes: Conjunctivae and EOM are normal. Pupils are equal, round, and reactive to light. Left eye exhibits no discharge. No scleral icterus.  Neck: Neck supple. No JVD present. No tracheal deviation present. No thyromegaly present.  Cardiovascular: Normal rate, regular rhythm, normal heart sounds and intact distal pulses.   No murmur heard. Pulmonary/Chest: Effort normal and breath sounds normal. No respiratory distress. She has no wheezes. She has no rales. She exhibits no tenderness.    Right mastectomy incision well healed. 8 mm firm smooth mobile palpable nodule in the right anterolateral chest wall, 7:00 position. Skin looks normal. No axillary mass. No other nodules. Left breast is lumpy and glandular but no dominant mass no skin change nipple areolar complex is normal. No axillary adenopathy.  Abdominal: Soft. Bowel sounds are normal. She exhibits no distension and no mass. There is no  tenderness. There is no rebound and no guarding.  Musculoskeletal: She exhibits no edema and no tenderness.  Lymphadenopathy:    She has no cervical adenopathy.  Neurological: She is alert and oriented to person, place, and time. She exhibits normal muscle tone. Coordination normal.  Skin: Skin is warm. No rash noted. She is not diaphoretic. No erythema. No pallor.  Psychiatric: She has a normal mood and affect. Her behavior is normal. Judgment and thought content normal.    Data Reviewed Our old records. Cancer center records. Dr. Nonda Lou office note. Mammograms from last year.  Assessment    Right chest wall mass, 8 mm. Excision is indicated to exclude Local recurrence of breast cancer. Hopefully this is a benign process such as a cyst  Invasive ductal carcinoma right breast, initial presentation as T2 N0 cancer, receptor positive, HER-2-negative  Status post neoadjuvant chemotherapy  Status post subsequent right mastectomy and sentinel mode biopsy in March 2008 for 1.5 cm residual  tumor and 2/5 positive lymph nodes, and so ypT1c ypN1,   Status post adjuvant radiation therapy to chest wall, subsequent tamoxifen with poor tolerance, And subsequent anastrozole for 5 years  Family history breast cancer in mother and grandmother  History HPV and cervical dysplasia  Depression     Plan    Proceed with excision of the small right chest wall nodule under local anesthesia in the near future  I've discussed indications, details, techniques, and numerous risks of the surgery with her. She's aware of the risk of bleeding, infection, nerve damage with chronic pain or numbness, and further investigation studies if this turns out to be cancer. She understands all these issues and all of her questions are answered. She agrees with this plan.  Plan left breast mammogram in August of this year old schedule.        Edsel Petrin. Dalbert Batman, M.D., [facility] General and  Minimally invasive Surgery Breast and Colorectal Surgery Office:   [phone] Pager:   [phone]  08/08/2013, 9:01 AM

## 2013-08-08 NOTE — Patient Instructions (Signed)
The small palpable nodule in your right chest wall is hopefully benign, but because of your history of breast cancer on the right side, this area should be excised for diagnosis.  I do not feel any discrete abnormality of your left breast. Be sure to get your left breast mammogram in August  You will be scheduled for a minor procedure to excise the nodule from the right chest wall to clarify the diagnosis.

## 2013-08-08 NOTE — H&P (Signed)
Kathryn Mcdaniel   MRN:  [id number]   Description: 58 year old female  Provider: [name], MD  Department: [facility]               Diagnoses      Mass of chest wall, right    -  Primary      786.6      Breast cancer, right breast          174.9             Current Vitals Most recent update: 08/08/2013  9:06 AM by Dois Davenport, LPN      BP Pulse Temp(Src) Resp Wt        120/70 76 98.5 F (36.9 C) 18 118 lb (53.524 kg)          Vitals History Recorded               History and Physical     [name], MD       Status: Signed            Patient ID: Kathryn Mcdaniel, female   DOB: 1956-02-14, 58 y.o.   MRN: [id number]     Note:  This dictation was prepared with Dragon/digital dictation along with [facility] technology. Any transcriptional errors that result from this process are unintentional.           HPI Kathryn Mcdaniel is a 58 y.o. female.  She is referred by Dr. Garnet Koyanagi for evaluation of  breast nodules. Dr. Jana Hakim was her oncologist.    She was diagnosed with right breast cancer in 2008. She underwent neoadjuvant chemotherapy for a 4.8 cm invasive cancer. All in March of 2008 she underwent a right total mastectomy and sentinel lymph node biopsy for what turned out to be a 1.5 cm residual tumor and 2/5 sentinel nodes positive. Final staging ypT1c, ypN1.   She  had radiation therapy. She was placed on tamoxifen but did not tolerate that and was then placed on aromatase inhibitor for 5 years. She has  been discharged from Dr. Virgie Dad care in 2014.   Most recent left breast mammogram dated 10/01/2012 looks normal, category 1.   The patient states she has felt a painless nodule of the right anterolateral chest wall below the right mastectomy incision for about one month. She has no complaints about the left breast.On 07/19/2013 she saw Dr. Etter Sjogren who states she felt  a  nodule the right side lower outer quadrant.   Comorbidities  include history of cervical dysplasia, right breast cancer, depression.   Family history shows prostate cancer in the father, breast cancer in the mother, breast cancer in paternal grandmother        Past Medical History   Diagnosis  Date   .  Human papillomavirus in conditions classified elsewhere and of unspecified site     .  Personal history of cervical dysplasia     .  Unspecified personal history presenting hazards to health         Radiation Therapy   .  Malignant neoplasm of breast (female), unspecified site         infiltrating ductal,right breast   .  Basal cell carcinoma of skin         left temple   .  Abnormal breast biopsy     .  Depression     .  Thyroid disease  Past Surgical History   Procedure  Laterality  Date   .  Breast surgery           right mastectomy   .  Breast biopsy       .  Nasal sinus surgery           sinus stripping         Family History   Problem  Relation  Age of Onset   .  Cancer  Father         prostate   .  Hypertension  Father     .  Cancer  Brother     .  Colon cancer  Neg Hx     .  Coronary artery disease  Neg Hx     .  Cancer  Other         grandmother, breast cancer   .  Breast cancer  Paternal Grandmother     .  Breast cancer  Mother     .  Squamous cell carcinoma  Mother         sinus   .  Diabetes  Father     .  Stroke  Maternal Grandmother     .  Transient ischemic attack  Mother          Social History History   Substance Use Topics   .  Smoking status:  Former Smoker       Quit date:  09/15/1984   .  Smokeless tobacco:  Never Used   .  Alcohol Use:  8.4 oz/week       14 Cans of beer per week         Comment: Occ         Allergies   Allergen  Reactions   .  Metoclopramide Hcl           Current Outpatient Prescriptions   Medication  Sig  Dispense  Refill   .  ALPRAZolam (XANAX) 0.5 MG tablet  Take 0.5 mg by mouth at bedtime as needed.           .  Ascorbic Acid (VITAMIN C) 1000 MG  tablet  Take 1,000 mg by mouth daily.         .  Calcium Carbonate-Vitamin D (CALTRATE 600+D) 600-400 MG-UNIT per tablet  Take 1 tablet by mouth 2 (two) times daily.         Marland Kitchen  FLUoxetine (PROZAC) 20 MG capsule  Take 60 mg by mouth daily.          Marland Kitchen  levothyroxine (SYNTHROID, LEVOTHROID) 75 MCG tablet  TAKE 1 TABLET BY MOUTH DAILY--RECHECK LABS IN 2 MONTHS   90 tablet   0   .  Multiple Vitamin (MULTIVITAMIN) tablet  Take 1 tablet by mouth daily.         .  vitamin E 400 UNIT capsule  Take 400 Units by mouth daily.             No current facility-administered medications for this visit.        Review of Systems   Constitutional: Negative for fever, chills and unexpected weight change.  HENT: Negative for congestion, hearing loss, sore throat, trouble swallowing and voice change.   Eyes: Negative for visual disturbance.  Respiratory: Negative for cough and wheezing.   Cardiovascular: Negative for chest pain, palpitations and leg swelling.  Gastrointestinal: Negative for nausea, vomiting, abdominal pain, diarrhea, constipation, blood in stool, abdominal distention and  anal bleeding.  Genitourinary: Positive for difficulty urinating. Negative for hematuria and vaginal bleeding.  Musculoskeletal: Negative for arthralgias.  Skin: Negative for rash and wound.  Neurological: Negative for seizures, syncope and headaches.  Hematological: Negative for adenopathy. Does not bruise/bleed easily.  Psychiatric/Behavioral: Negative for confusion.      There were no vitals taken for this visit.   Physical Exam   Constitutional: She is oriented to person, place, and time. She appears well-developed and well-nourished. No distress.  Thin. Good spirits. Friendly and cooperative.  HENT:   Head: Normocephalic and atraumatic.   Nose: Nose normal.   Mouth/Throat: No oropharyngeal exudate.  Eyes: Conjunctivae and EOM are normal. Pupils are equal, round, and reactive to light. Left eye exhibits no  discharge. No scleral icterus.  Neck: Neck supple. No JVD present. No tracheal deviation present. No thyromegaly present.  Cardiovascular: Normal rate, regular rhythm, normal heart sounds and intact distal pulses.    No murmur heard. Pulmonary/Chest: Effort normal and breath sounds normal. No respiratory distress. She has no wheezes. She has no rales. She exhibits no tenderness.    Right mastectomy incision well healed. 8 mm firm smooth mobile palpable nodule in the right anterolateral chest wall, 7:00 position. Skin looks normal. No axillary mass. No other nodules. Left breast is lumpy and glandular but no dominant mass no skin change nipple areolar complex is normal. No axillary adenopathy.  Abdominal: Soft. Bowel sounds are normal. She exhibits no distension and no mass. There is no tenderness. There is no rebound and no guarding.  Musculoskeletal: She exhibits no edema and no tenderness.  Lymphadenopathy:    She has no cervical adenopathy.  Neurological: She is alert and oriented to person, place, and time. She exhibits normal muscle tone. Coordination normal.  Skin: Skin is warm. No rash noted. She is not diaphoretic. No erythema. No pallor.  Psychiatric: She has a normal mood and affect. Her behavior is normal. Judgment and thought content normal.      Data Reviewed Our old records. Cancer center records. Dr. Nonda Lou office note. Mammograms from last year.   Assessment    Right chest wall mass, 8 mm. Excision is indicated to exclude Local recurrence of breast cancer. Hopefully this is a benign process such as a cyst   Invasive ductal carcinoma right breast, initial presentation as T2 N0 cancer, receptor positive, HER-2-negative   Status post neoadjuvant chemotherapy   Status post subsequent right mastectomy and sentinel mode biopsy in March 2008 for 1.5 cm residual tumor and 2/5 positive lymph nodes, and so ypT1c ypN1,    Status post adjuvant radiation therapy to chest  wall, subsequent tamoxifen with poor tolerance, And subsequent anastrozole for 5 years   Family history breast cancer in mother and grandmother   History HPV and cervical dysplasia   Depression       Plan    Proceed with excision of the small right chest wall nodule under local anesthesia in the near future   I've discussed indications, details, techniques, and numerous risks of the surgery with her. She's aware of the risk of bleeding, infection, nerve damage with chronic pain or numbness, and further investigation studies if this turns out to be cancer. She understands all these issues and all of her questions are answered. She agrees with this plan.   Plan left breast mammogram in August of this year old schedule.           Edsel Petrin. Dalbert Batman, M.D., [facility] and Minimally invasive Surgery Breast and Colorectal Surgery Office:   [phone] Pager:   [phone]

## 2013-08-09 ENCOUNTER — Ambulatory Visit (HOSPITAL_BASED_OUTPATIENT_CLINIC_OR_DEPARTMENT_OTHER)
Admission: RE | Admit: 2013-08-09 | Discharge: 2013-08-09 | Disposition: A | Discharge status: Home / Self Care | Payer: BC Managed Care – PPO | Source: Ambulatory Visit | Attending: General Surgery | Admitting: General Surgery

## 2013-08-09 ENCOUNTER — Encounter (HOSPITAL_BASED_OUTPATIENT_CLINIC_OR_DEPARTMENT_OTHER): Payer: Self-pay | Admitting: *Deleted

## 2013-08-09 ENCOUNTER — Encounter (HOSPITAL_BASED_OUTPATIENT_CLINIC_OR_DEPARTMENT_OTHER)
Admission: RE | Disposition: A | Discharge status: Home / Self Care | Payer: Self-pay | Source: Ambulatory Visit | Attending: General Surgery

## 2013-08-09 DIAGNOSIS — Z9010 Acquired absence of unspecified breast and nipple: Secondary | ICD-10-CM | POA: Insufficient documentation

## 2013-08-09 DIAGNOSIS — Z9221 Personal history of antineoplastic chemotherapy: Secondary | ICD-10-CM | POA: Insufficient documentation

## 2013-08-09 DIAGNOSIS — D213 Benign neoplasm of connective and other soft tissue of thorax: Secondary | ICD-10-CM

## 2013-08-09 DIAGNOSIS — F3289 Other specified depressive episodes: Secondary | ICD-10-CM | POA: Insufficient documentation

## 2013-08-09 DIAGNOSIS — Z923 Personal history of irradiation: Secondary | ICD-10-CM | POA: Insufficient documentation

## 2013-08-09 DIAGNOSIS — Z853 Personal history of malignant neoplasm of breast: Secondary | ICD-10-CM | POA: Insufficient documentation

## 2013-08-09 DIAGNOSIS — F329 Major depressive disorder, single episode, unspecified: Secondary | ICD-10-CM | POA: Insufficient documentation

## 2013-08-09 DIAGNOSIS — Z79899 Other long term (current) drug therapy: Secondary | ICD-10-CM | POA: Insufficient documentation

## 2013-08-09 DIAGNOSIS — Z87891 Personal history of nicotine dependence: Secondary | ICD-10-CM | POA: Insufficient documentation

## 2013-08-09 DIAGNOSIS — C50911 Malignant neoplasm of unspecified site of right female breast: Secondary | ICD-10-CM

## 2013-08-09 DIAGNOSIS — Z85828 Personal history of other malignant neoplasm of skin: Secondary | ICD-10-CM | POA: Insufficient documentation

## 2013-08-09 HISTORY — PX: MASS EXCISION: SHX2000

## 2013-08-09 SURGERY — MINOR EXCISION OF MASS
Anesthesia: LOCAL | Site: Chest | Laterality: Right

## 2013-08-09 MED ORDER — BUPIVACAINE HCL (PF) 0.5 % IJ SOLN
INTRAMUSCULAR | Status: AC
  Filled 2013-08-09: qty 30

## 2013-08-09 MED ORDER — SODIUM BICARBONATE 4 % IV SOLN
INTRAVENOUS | Status: AC
  Filled 2013-08-09: qty 5

## 2013-08-09 MED ORDER — CHLORHEXIDINE GLUCONATE 4 % EX LIQD: 1.0000 "application " | Freq: Once | CUTANEOUS | Status: DC

## 2013-08-09 MED ORDER — LIDOCAINE HCL (PF) 1 % IJ SOLN
INTRAMUSCULAR | Status: AC
  Filled 2013-08-09: qty 30

## 2013-08-09 MED ORDER — SODIUM BICARBONATE 4 % IV SOLN
INTRAVENOUS | Status: DC | PRN
  Administered 2013-08-09: 13:00:00

## 2013-08-09 SURGICAL SUPPLY — 40 items
ADH SKN CLS APL DERMABOND .7 (GAUZE/BANDAGES/DRESSINGS) ×1
APL SKNCLS STERI-STRIP NONHPOA (GAUZE/BANDAGES/DRESSINGS)
BALL CTTN LRG ABS STRL LF (GAUZE/BANDAGES/DRESSINGS)
BENZOIN TINCTURE PRP APPL 2/3 (GAUZE/BANDAGES/DRESSINGS) IMPLANT
BLADE SURG 15 STRL LF DISP TIS (BLADE) ×1 IMPLANT
BLADE SURG 15 STRL SS (BLADE) ×2
COTTONBALL LRG STERILE PKG (GAUZE/BANDAGES/DRESSINGS) IMPLANT
DERMABOND ADVANCED (GAUZE/BANDAGES/DRESSINGS) ×1
DERMABOND ADVANCED .7 DNX12 (GAUZE/BANDAGES/DRESSINGS) IMPLANT
DRAPE SURG 17X23 STRL (DRAPES) IMPLANT
ELECT REM PT RETURN 9FT ADLT (ELECTROSURGICAL) ×2
ELECTRODE REM PT RTRN 9FT ADLT (ELECTROSURGICAL) IMPLANT
GLOVE BIO SURGEON STRL SZ 6.5 (GLOVE) ×1 IMPLANT
GLOVE BIOGEL PI IND STRL 7.0 (GLOVE) IMPLANT
GLOVE BIOGEL PI INDICATOR 7.0 (GLOVE) ×1
GLOVE EUDERMIC 7 POWDERFREE (GLOVE) ×2 IMPLANT
MARKER SKIN DUAL TIP RULER LAB (MISCELLANEOUS) ×2 IMPLANT
NDL HYPO 25X1 1.5 SAFETY (NEEDLE) ×1 IMPLANT
NDL SAFETY ECLIPSE 18X1.5 (NEEDLE) ×1 IMPLANT
NEEDLE HYPO 18GX1.5 SHARP (NEEDLE)
NEEDLE HYPO 25X1 1.5 SAFETY (NEEDLE) ×2 IMPLANT
NS IRRIG 1000ML POUR BTL (IV SOLUTION) ×1 IMPLANT
PENCIL BUTTON HOLSTER BLD 10FT (ELECTRODE) ×1 IMPLANT
SHEET MEDIUM DRAPE 40X70 STRL (DRAPES) ×1 IMPLANT
SPONGE GAUZE 4X4 12PLY STER LF (GAUZE/BANDAGES/DRESSINGS) IMPLANT
STRIP CLOSURE SKIN 1/2X4 (GAUZE/BANDAGES/DRESSINGS) IMPLANT
SUT MNCRL AB 3-0 PS2 18 (SUTURE) IMPLANT
SUT MNCRL AB 4-0 PS2 18 (SUTURE) ×1 IMPLANT
SUT SILK 3 0 TIES 17X18 (SUTURE)
SUT SILK 3-0 18XBRD TIE BLK (SUTURE) IMPLANT
SUT VIC AB 4-0 P-3 18XBRD (SUTURE) IMPLANT
SUT VIC AB 4-0 P3 18 (SUTURE)
SUT VIC AB 5-0 P-3 18X BRD (SUTURE) IMPLANT
SUT VIC AB 5-0 P3 18 (SUTURE)
SUT VICRYL 3-0 CR8 SH (SUTURE) ×1 IMPLANT
SUT VICRYL AB 3 0 TIES (SUTURE) IMPLANT
SWABSTICK POVIDONE IODINE SNGL (MISCELLANEOUS) IMPLANT
SYR CONTROL 10ML LL (SYRINGE) ×2 IMPLANT
TOWEL OR 17X24 6PK STRL BLUE (TOWEL DISPOSABLE) ×1 IMPLANT
TRAY DSU PREP LF (CUSTOM PROCEDURE TRAY) IMPLANT

## 2013-08-09 NOTE — Interval H&P Note (Signed)
History and Physical Interval Note:  08/09/2013 12:59 PM  Kathryn Mcdaniel  has presented today for surgery, with the diagnosis of mass right chest wall  The various methods of treatment have been discussed with the patient and family. After consideration of risks, benefits and other options for treatment, the patient has consented to  Procedure(s): MINOR EXCISION OF MASS (Right) as a surgical intervention .  The patient's history has been reviewed, patient examined, no change in status, stable for surgery.  I have reviewed the patient's chart and labs.  Questions were answered to the patient's satisfaction.     [name]

## 2013-08-09 NOTE — Discharge Instructions (Signed)
Keep an ice pack on the wound, intermittently, 10 minutes at a time, for 24 hours  You may shower in 24 hours. No tub baths  No sports or dirty activities for a week or 2  We will call the pathology report next Tuesday or Wednesday  Return to see Dr. Dalbert Batman in approximately 3 weeks. Call and make an appointment.

## 2013-08-09 NOTE — Op Note (Signed)
Patient Name:           Kathryn Mcdaniel   Date of Surgery:        08/09/2013  Pre op Diagnosis:      Right chest wall mass, 8 mm diameter History right breast cancer treated with neoadjuvant chemotherapy, subsequent right mastectomy and sentinel node  biopsy 2008, subsequent adjuvant radiation therapy chest wall  Post op Diagnosis:    Same   Procedure:                 Wide local excision right chest wall mass  Surgeon:                     Edsel Petrin. Dalbert Batman, M.D., FACS  Assistant:                      None  Operative Indications:   [name] is a 58 y.o. female. She is referred by Dr. Garnet Koyanagi for evaluation of right chest wall nodule.   Dr. Jana Hakim was her oncologist.  She was diagnosed with right breast cancer in 2008. She underwent neoadjuvant chemotherapy for a 4.8 cm invasive cancer. In March of 2008 she underwent a right total mastectomy and sentinel lymph node biopsy for what turned out to be a 1.5 cm residual tumor and 2/5 sentinel nodes positive. Final staging ypT1c, ypN1. She had radiation therapy. She was placed on tamoxifen but did not tolerate that and was then placed on aromatase inhibitor for 5 years. She has been discharged from Dr. Virgie Dad care in 2014.  Most recent left breast mammogram dated 10/01/2012 looks normal, category 1.  The patient states she has felt a painless nodule of the right anterolateral chest wall below the right mastectomy incision for about one month. She has no complaints about the left breast.On 07/19/2013 she saw Dr. Etter Sjogren who states she felt a nodule the right side lower outer quadrant.  Comorbidities include history of cervical dysplasia, right breast cancer, depression.  Family history shows prostate cancer in the father, breast cancer in the mother, breast cancer in paternal grandmother   Operative Findings:       There was an 8 mm solid nodule in the deep subcutaneous tissue of the right anterolateral chest wall, several centimeters below  the mastectomy incision. I made a 3 cm x 1 cm elliptical incision of skin taking this down all the way to the muscle fascia. I got widely around this, but  in removing this it somewhat fragmented. Nevertheless, I felt I widely excised this area.  Procedure in Detail:          The patient was brought to room at [facility] day surgery center and placed supine on the table with arms out at her sides. Right chest wall prepped and draped in a sterile fashion. Surgical time out performed. 1% Xylocaine with epinephrine local used to transverse elliptical incision made and dissection carried straight down to the muscle fascia. I dissected  widely around this area and sent the specimen for routine histology. Hemostasis excellent. Subcutaneous tissue closed with 3-0 Vicryl and skin closed with running subcuticular suture of 4-0 Monocryl and Dermabond. She tolerated this well. EBL less than 5 cc. Counts correct. Complications none.     Edsel Petrin. Dalbert Batman, M.D., FACS General and Minimally Invasive Surgery Breast and Colorectal Surgery  08/09/2013 1:28 PM

## 2013-08-12 ENCOUNTER — Encounter (HOSPITAL_BASED_OUTPATIENT_CLINIC_OR_DEPARTMENT_OTHER): Payer: Self-pay | Admitting: General Surgery

## 2013-08-12 NOTE — Progress Notes (Signed)
Quick Note:  Inform patient of Pathology report,. Chest wall mass benign. Looks like a benign neuroma. Excellent news. Will discuss in detail at followup visit.  hmi ______

## 2013-08-13 NOTE — Progress Notes (Signed)
Quick Note:  Inform patient of Pathology report,. ______

## 2013-08-15 NOTE — Progress Notes (Signed)
Quick Note:  Inform patient of Pathology report,. Benign neuroma.  hmi ______

## 2013-08-21 ENCOUNTER — Ambulatory Visit (INDEPENDENT_AMBULATORY_CARE_PROVIDER_SITE_OTHER): Payer: BC Managed Care – PPO | Admitting: General Surgery

## 2013-08-21 ENCOUNTER — Encounter (INDEPENDENT_AMBULATORY_CARE_PROVIDER_SITE_OTHER): Payer: Self-pay | Admitting: General Surgery

## 2013-08-21 VITALS — BP 118/75 | HR 71 | Temp 98.1°F | Resp 14 | Ht 67.0 in | Wt 115.6 lb

## 2013-08-21 DIAGNOSIS — D219 Benign neoplasm of connective and other soft tissue, unspecified: Secondary | ICD-10-CM

## 2013-08-21 DIAGNOSIS — C50919 Malignant neoplasm of unspecified site of unspecified female breast: Secondary | ICD-10-CM

## 2013-08-21 DIAGNOSIS — D3610 Benign neoplasm of peripheral nerves and autonomic nervous system, unspecified: Secondary | ICD-10-CM | POA: Insufficient documentation

## 2013-08-21 NOTE — Progress Notes (Signed)
Patient ID: Kathryn Mcdaniel, female   DOB: 09/29/1955, 57 y.o.   MRN: [id number] History: This patient recently underwent biopsy of a small subcutaneous nodule in the right lateral chest wall, inferior to her mastectomy incision. 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 She has been given a copy of the report. No wound healing problems.  Exam: Patient looks well. Good spirits. Right mastectomy skin flaps are healthy without nodules or ulcerations. Small incision right anterolateral chest wall is healing normally.  Assessment:    Neuroma right chest wall, recovering uneventfully following excision.     Invasive ductal carcinoma right breast, initial presentation as T2 N0 cancer, receptor positive, HER-2-negative     Status post neoadjuvant chemotherapy     Status post subsequent right mastectomy and sentinel mode biopsy in March 2008 for 1.5 cm residual tumor and 2/5 positive lymph nodes, and so ypT1c ypN1,     Status post adjuvant radiation therapy to chest wall, subsequent tamoxifen with poor tolerance, And subsequent anastrozole for 5 years     Family history breast cancer in mother and grandmother     History HPV and cervical dysplasia     Depression  Plan: Advise annual breast exam and annual mammogram or MRI and  she states that she will ask Dr. Ronita Hipps to do this. Return to see me if needed.   Edsel Petrin. Dalbert Batman, M.D., [facility] General and Minimally invasive Surgery Breast and Colorectal Surgery Office:   [phone]

## 2013-08-21 NOTE — Patient Instructions (Signed)
Biopsy of your right chest wall shows a benign neuroma. You have been given a copy of the pathology report.  The right chest wall is  healing normally.  Be sure to get annual mammograms or MRI, and get an annual breast exam with Dr. Ronita Hipps.  Return to see Dr. Dalbert Batman as necessary.

## 2013-10-01 ENCOUNTER — Other Ambulatory Visit (INDEPENDENT_AMBULATORY_CARE_PROVIDER_SITE_OTHER): Payer: BC Managed Care – PPO

## 2013-10-01 DIAGNOSIS — E039 Hypothyroidism, unspecified: Secondary | ICD-10-CM

## 2013-10-01 LAB — TSH: TSH: 2.13 u[IU]/mL (ref 0.35–4.50)

## 2013-10-07 ENCOUNTER — Other Ambulatory Visit: Payer: Self-pay | Admitting: Family Medicine

## 2013-10-07 MED ORDER — LEVOTHYROXINE SODIUM 75 MCG PO TABS: ORAL_TABLET | ORAL | Status: DC

## 2013-10-30 ENCOUNTER — Other Ambulatory Visit: Payer: Self-pay

## 2013-10-30 DIAGNOSIS — Z1231 Encounter for screening mammogram for malignant neoplasm of breast: Secondary | ICD-10-CM

## 2013-10-30 DIAGNOSIS — Z9011 Acquired absence of right breast and nipple: Secondary | ICD-10-CM

## 2013-11-05 ENCOUNTER — Encounter: Payer: Self-pay | Admitting: Family Medicine

## 2013-11-05 ENCOUNTER — Ambulatory Visit (INDEPENDENT_AMBULATORY_CARE_PROVIDER_SITE_OTHER): Payer: BC Managed Care – PPO | Admitting: Family Medicine

## 2013-11-05 VITALS — BP 129/81 | HR 73 | Temp 98.6°F | Wt 118.6 lb

## 2013-11-05 DIAGNOSIS — R300 Dysuria: Secondary | ICD-10-CM

## 2013-11-05 DIAGNOSIS — R350 Frequency of micturition: Secondary | ICD-10-CM

## 2013-11-05 DIAGNOSIS — T148XXA Other injury of unspecified body region, initial encounter: Secondary | ICD-10-CM

## 2013-11-05 DIAGNOSIS — R21 Rash and other nonspecific skin eruption: Secondary | ICD-10-CM

## 2013-11-05 LAB — POCT URINALYSIS DIPSTICK
Bilirubin, UA: NEGATIVE
Glucose, UA: NEGATIVE
Ketones, UA: NEGATIVE
NITRITE UA: NEGATIVE
PH UA: 6.5
PROTEIN UA: NEGATIVE
RBC UA: NEGATIVE
Spec Grav, UA: 1.01
Urobilinogen, UA: 2

## 2013-11-05 MED ORDER — CIPROFLOXACIN HCL 500 MG PO TABS: 500.0000 mg | ORAL_TABLET | Freq: Two times a day (BID) | ORAL | Status: AC

## 2013-11-05 NOTE — Progress Notes (Signed)
  Subjective:    Kathryn Mcdaniel is a 58 y.o. female who complains of burning with urination, foul smelling urine and frequency. She has had symptoms for 3 days. Patient also complains of nothing else. Patient denies back pain, congestion, cough, fever, headache, rhinitis, sorethroat, stomach ache and vaginal discharge. Patient does not have a history of recurrent UTI. Patient does not have a history of pyelonephritis.   The following portions of the patient's history were reviewed and updated as appropriate: allergies, current medications, past family history, past medical history, past social history, past surgical history and problem list.  Review of Systems Pertinent items are noted in HPI.    Objective:    There were no vitals taken for this visit. General appearance: alert, cooperative, appears stated age and no distress Abdomen: soft, non-tender; bowel sounds normal; no masses,  no organomegaly Skin: papules on both legs-- non pruritic  Laboratory:  Urine dipstick: 1+ for leukocyte esterase.   Micro exam: not done.    Assessment:    dysuria     Plan:    Medications: ciprofloxacin. Maintain adequate hydration. Follow up if symptoms not improving, and as needed.  1. Dysuria  - POCT Urinalysis Dipstick - Urine Culture - ciprofloxacin (CIPRO) 500 MG tablet; Take 1 tablet (500 mg total) by mouth 2 (two) times daily.  Dispense: 10 tablet; Refill: 0  2. Urine frequency  - POCT Urinalysis Dipstick - Urine Culture  3. Rash and nonspecific skin eruption We will watchit -- she will call if symptoms worsen  4. Traumatic neuroma Per surgeon

## 2013-11-05 NOTE — Progress Notes (Signed)
Pre visit review using our clinic review tool, if applicable. No additional management support is needed unless otherwise documented below in the visit note. 

## 2013-11-05 NOTE — Patient Instructions (Signed)

## 2013-11-07 LAB — URINE CULTURE
Colony Count: NO GROWTH
Organism ID, Bacteria: NO GROWTH

## 2013-11-15 ENCOUNTER — Ambulatory Visit
Admission: RE | Admit: 2013-11-15 | Discharge: 2013-11-15 | Disposition: A | Discharge status: Home / Self Care | Payer: BC Managed Care – PPO | Source: Ambulatory Visit

## 2013-11-15 DIAGNOSIS — Z9011 Acquired absence of right breast and nipple: Secondary | ICD-10-CM

## 2013-11-15 DIAGNOSIS — Z1231 Encounter for screening mammogram for malignant neoplasm of breast: Secondary | ICD-10-CM

## 2013-12-04 ENCOUNTER — Telehealth: Payer: Self-pay | Admitting: Family Medicine

## 2013-12-04 MED ORDER — FLUOXETINE HCL 20 MG PO CAPS: 60.0000 mg | ORAL_CAPSULE | Freq: Every day | ORAL | Status: DC

## 2013-12-04 MED ORDER — ALPRAZOLAM 0.5 MG PO TABS: 0.5000 mg | ORAL_TABLET | Freq: Every evening | ORAL | Status: AC | PRN

## 2013-12-04 NOTE — Telephone Encounter (Signed)
Caller name: Keosha  Call back number: [phone] or [phone] Pharmacy: [facility]  Reason for call:  Pt states that when she saw dr. Etter Sjogren last, she stated she would begin filling these medications for her.  She would like this now refilled.  Please advise.  FLUoxetine (PROZAC) 20 MG capsule ALPRAZolam (XANAX) 0.5 MG tablet

## 2013-12-04 NOTE — Telephone Encounter (Signed)
[facility] for #30 of each, no refills

## 2013-12-04 NOTE — Telephone Encounter (Signed)
meds filled and faxed.  

## 2013-12-04 NOTE — Telephone Encounter (Signed)
Last OV 11-05-13. No documentation of meds being filled and no record of ok from [facility].

## 2013-12-20 ENCOUNTER — Other Ambulatory Visit: Payer: Self-pay

## 2013-12-20 ENCOUNTER — Other Ambulatory Visit: Payer: Self-pay | Admitting: Family Medicine

## 2013-12-20 MED ORDER — FLUOXETINE HCL 20 MG PO CAPS: 60.0000 mg | ORAL_CAPSULE | Freq: Every day | ORAL | Status: DC

## 2013-12-20 NOTE — Telephone Encounter (Signed)
According to a previous phone call this patient stated you would fill this medication for her. She is taking 3 daily. Please advise if it is ok to fill.      KP

## 2013-12-20 NOTE — Telephone Encounter (Signed)
[facility] pt

## 2014-02-03 ENCOUNTER — Other Ambulatory Visit: Payer: Self-pay | Admitting: Obstetrics and Gynecology

## 2014-02-03 DIAGNOSIS — M8580 Other specified disorders of bone density and structure, unspecified site: Secondary | ICD-10-CM

## 2014-03-04 ENCOUNTER — Ambulatory Visit
Admission: RE | Admit: 2014-03-04 | Discharge: 2014-03-04 | Disposition: A | Discharge status: Home / Self Care | Payer: BLUE CROSS/BLUE SHIELD | Source: Ambulatory Visit | Attending: Obstetrics and Gynecology | Admitting: Obstetrics and Gynecology

## 2014-03-04 DIAGNOSIS — M8580 Other specified disorders of bone density and structure, unspecified site: Secondary | ICD-10-CM

## 2014-03-08 ENCOUNTER — Encounter (HOSPITAL_BASED_OUTPATIENT_CLINIC_OR_DEPARTMENT_OTHER): Payer: Self-pay

## 2014-03-08 ENCOUNTER — Emergency Department (HOSPITAL_BASED_OUTPATIENT_CLINIC_OR_DEPARTMENT_OTHER)
Admission: EM | Admit: 2014-03-08 | Discharge: 2014-03-08 | Disposition: A | Discharge status: Home / Self Care | Payer: BLUE CROSS/BLUE SHIELD | Attending: Emergency Medicine | Admitting: Emergency Medicine

## 2014-03-08 ENCOUNTER — Emergency Department (HOSPITAL_BASED_OUTPATIENT_CLINIC_OR_DEPARTMENT_OTHER): Payer: BLUE CROSS/BLUE SHIELD

## 2014-03-08 DIAGNOSIS — Z79899 Other long term (current) drug therapy: Secondary | ICD-10-CM | POA: Insufficient documentation

## 2014-03-08 DIAGNOSIS — F329 Major depressive disorder, single episode, unspecified: Secondary | ICD-10-CM | POA: Insufficient documentation

## 2014-03-08 DIAGNOSIS — W540XXA Bitten by dog, initial encounter: Secondary | ICD-10-CM | POA: Diagnosis not present

## 2014-03-08 DIAGNOSIS — Y998 Other external cause status: Secondary | ICD-10-CM | POA: Diagnosis not present

## 2014-03-08 DIAGNOSIS — Y9389 Activity, other specified: Secondary | ICD-10-CM | POA: Diagnosis not present

## 2014-03-08 DIAGNOSIS — E039 Hypothyroidism, unspecified: Secondary | ICD-10-CM | POA: Diagnosis not present

## 2014-03-08 DIAGNOSIS — Z85828 Personal history of other malignant neoplasm of skin: Secondary | ICD-10-CM | POA: Insufficient documentation

## 2014-03-08 DIAGNOSIS — L03113 Cellulitis of right upper limb: Secondary | ICD-10-CM

## 2014-03-08 DIAGNOSIS — Y9289 Other specified places as the place of occurrence of the external cause: Secondary | ICD-10-CM | POA: Insufficient documentation

## 2014-03-08 DIAGNOSIS — S61531A Puncture wound without foreign body of right wrist, initial encounter: Secondary | ICD-10-CM | POA: Insufficient documentation

## 2014-03-08 DIAGNOSIS — Z853 Personal history of malignant neoplasm of breast: Secondary | ICD-10-CM | POA: Diagnosis not present

## 2014-03-08 DIAGNOSIS — Z8619 Personal history of other infectious and parasitic diseases: Secondary | ICD-10-CM | POA: Insufficient documentation

## 2014-03-08 DIAGNOSIS — Z87891 Personal history of nicotine dependence: Secondary | ICD-10-CM | POA: Diagnosis not present

## 2014-03-08 LAB — CBC WITH DIFFERENTIAL/PLATELET
BASOS ABS: 0 10*3/uL (ref 0.0–0.1)
Basophils Relative: 0 % (ref 0–1)
Eosinophils Absolute: 0.1 10*3/uL (ref 0.0–0.7)
Eosinophils Relative: 1 % (ref 0–5)
HCT: 36.6 % (ref 36.0–46.0)
Hemoglobin: 12 g/dL (ref 12.0–15.0)
Lymphocytes Relative: 16 % (ref 12–46)
Lymphs Abs: 1.5 10*3/uL (ref 0.7–4.0)
MCH: 31.8 pg (ref 26.0–34.0)
MCHC: 32.8 g/dL (ref 30.0–36.0)
MCV: 97.1 fL (ref 78.0–100.0)
MONO ABS: 0.8 10*3/uL (ref 0.1–1.0)
MONOS PCT: 8 % (ref 3–12)
Neutro Abs: 6.8 10*3/uL (ref 1.7–7.7)
Neutrophils Relative %: 75 % (ref 43–77)
Platelets: 171 10*3/uL (ref 150–400)
RBC: 3.77 MIL/uL — AB (ref 3.87–5.11)
RDW: 12 % (ref 11.5–15.5)
WBC: 9.1 10*3/uL (ref 4.0–10.5)

## 2014-03-08 LAB — BASIC METABOLIC PANEL
Anion gap: 6 (ref 5–15)
BUN: 19 mg/dL (ref 6–23)
CO2: 28 mmol/L (ref 19–32)
Calcium: 9.2 mg/dL (ref 8.4–10.5)
Chloride: 103 mEq/L (ref 96–112)
Creatinine, Ser: 0.48 mg/dL — ABNORMAL LOW (ref 0.50–1.10)
GFR calc Af Amer: >90 mL/min (ref >90)
GFR calc non Af Amer: >90 mL/min (ref >90)
GLUCOSE: 85 mg/dL (ref 70–99)
POTASSIUM: 3.7 mmol/L (ref 3.5–5.1)
Sodium: 137 mmol/L (ref 135–145)

## 2014-03-08 MED ORDER — SODIUM CHLORIDE 0.9 % IV SOLN
INTRAVENOUS | Status: DC
  Administered 2014-03-08: 19:00:00 via INTRAVENOUS

## 2014-03-08 MED ORDER — AMPICILLIN-SULBACTAM SODIUM 3 (2-1) G IJ SOLR
3.0000 g | Freq: Once | INTRAMUSCULAR | Status: AC
  Administered 2014-03-08: 3 g via INTRAVENOUS
  Filled 2014-03-08: qty 3

## 2014-03-08 NOTE — ED Notes (Signed)
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

## 2014-03-08 NOTE — ED Provider Notes (Signed)
CSN: [id number]     Arrival date & time 03/08/14  1738 History  This chart was scribed for Fredia Sorrow, MD by Randa Evens, ED Scribe. This patient was seen in room MHT13/MHT13 and the patient's care was started at 6:03 PM.      Chief Complaint  Patient presents with  . Animal Bite   Patient is a 60 y.o. female presenting with animal bite. The history is provided by the patient. No language interpreter was used.  Animal Bite Contact animal:  Dog Location:  Hand Hand injury location:  R wrist and R hand Time since incident:  1 day Pain details:    Severity:  Mild   Timing:  Constant Incident location:  Another residence Animal's rabies vaccination status:  Up to date Tetanus status:  Up to date Relieved by:  None tried Worsened by:  Activity Ineffective treatments:  None tried Associated symptoms: rash and swelling   Associated symptoms: no fever    HPI Comments: Kathryn Mcdaniel is a 59 y.o. female who presents to the Emergency Department complaining of dog bite onset 1 day prior at 12 PM. Pt states she was bitten in the right wrist area. Pt states she has associated swelling and redness. Pt states that the pain is worse with movement of the hand  Pt states that dogs immunizations are UTD. Pt states that her tetanus is UTD. Pt states she has a Hx of breast cancer in the right breast. Pt states she has had a right sided mastectomy.   PCP Dr. Etter Sjogren    Past Medical History  Diagnosis Date  . 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 Personal history of cervical dysplasia   . Unspecified personal history presenting hazards to health     Radiation Therapy  . 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 Basal cell carcinoma of skin     left temple  . Abnormal breast biopsy   . Depression   . Thyroid disease    Past Surgical History  Procedure Laterality Date  . Breast surgery       right mastectomy  . Breast biopsy    . Nasal sinus surgery      sinus stripping  . Mass excision Right 08/09/2013    Procedure: MINOR EXCISION OF MASS CHEST WALL;  Surgeon: [name], MD;  Location: [facility];  Service: General;  Laterality: Right;   Family History  Problem Relation Age of Onset  . Cancer Father     prostate  . Hypertension Father   . Cancer Brother   . Colon cancer Neg Hx   . Coronary artery disease Neg Hx   . Cancer Other     grandmother, breast cancer  . Breast cancer Paternal Grandmother   . Breast cancer Mother   . Squamous cell carcinoma Mother     sinus  . Diabetes Father   . Stroke Maternal Grandmother   . Transient ischemic attack Mother    History  Substance Use Topics  . Smoking status: Former Smoker    Quit date: 09/15/1984  . Smokeless tobacco: Never Used  . Alcohol Use: 8.4 oz/week    14 Cans of beer per week     Comment: daily   OB History    No data available     Review of Systems  Constitutional: Negative for fever and chills.  HENT: Negative for rhinorrhea  and sore throat.   Eyes: Negative for visual disturbance.  Respiratory: Negative for cough and shortness of breath.   Cardiovascular: Negative for chest pain and leg swelling.  Gastrointestinal: Negative for nausea, vomiting, abdominal pain and diarrhea.  Genitourinary: Negative for dysuria.  Musculoskeletal: Negative for back pain and neck pain.  Skin: Positive for rash and wound.  Neurological: Negative for headaches.  Psychiatric/Behavioral: Negative for confusion.  All other systems reviewed and are negative.     Allergies  Compazine and Metoclopramide hcl  Home Medications   Prior to Admission medications   Medication Sig Start Date End Date Taking? Authorizing Provider  ALPRAZolam Duanne Moron) 0.5 MG tablet Take 1 tablet (0.5 mg total) by mouth at bedtime as needed. 12/04/13  Yes Midge Minium, MD  Ascorbic Acid (VITAMIN C) 1000 MG tablet  Take 1,000 mg by mouth daily.   Yes Historical Provider, MD  Calcium Carbonate-Vitamin D (CALTRATE 600+D) 600-400 MG-UNIT per tablet Take 1 tablet by mouth 2 (two) times daily. 05/04/12  Yes Yvonne R [facility], DO  FLUoxetine (PROZAC) 20 MG capsule Take 3 capsules (60 mg total) by mouth daily. 12/20/13  Yes Rosalita Chessman, DO  levothyroxine (SYNTHROID, LEVOTHROID) 75 MCG tablet TAKE 1 TABLET BY MOUTH DAILY--RECHECK LABS IN 2 MONTHS 10/07/13  Yes Rosalita Chessman, DO  Multiple Vitamin (MULTIVITAMIN) tablet Take 1 tablet by mouth daily.   Yes Historical Provider, MD  vitamin E 400 UNIT capsule Take 400 Units by mouth daily.   Yes Historical Provider, MD   BP 111/68 mmHg  Pulse 82  Temp(Src) 99.1 F (37.3 C) (Oral)  Resp 16  Ht 5\' 7"  (1.702 m)  Wt 118 lb (53.524 kg)  BMI 18.48 kg/m2  SpO2 100%   Physical Exam  Constitutional: She is oriented to person, place, and time. She appears well-developed and well-nourished. No distress.  HENT:  Head: Normocephalic and atraumatic.  Eyes: Conjunctivae and EOM are normal.  Neck: Neck supple. No tracheal deviation present.  Cardiovascular: Normal rate and regular rhythm.   Pulmonary/Chest: Effort normal and breath sounds normal. No respiratory distress. She has no wheezes. She has no rales.  Abdominal: Soft. Bowel sounds are normal. There is no tenderness.  Musculoskeletal: Normal range of motion. She exhibits tenderness. She exhibits no edema.  4 puncture wound 2 on dorsum and 2 on palmar side wrist, 2+ radial pulse, good cap refill, Swelling to fingers, redness goes up 2/3 of forearm, puffiness to back of hand and wrist, Minimal redness on palmar side, no significant swelling on palm side  Neurological: She is alert and oriented to person, place, and time. No cranial nerve deficit.  Skin: Skin is warm and dry.  Psychiatric: She has a normal mood and affect. Her behavior is normal.  Nursing note and vitals reviewed.   ED Course  Procedures (including  critical care time) DIAGNOSTIC STUDIES: Oxygen Saturation is 97% on RA, normal by my interpretation.    COORDINATION OF CARE: 6:48 PM-Discussed treatment plan with pt at bedside and pt agreed to plan.     Labs Review Labs Reviewed  BASIC METABOLIC PANEL - Abnormal; Notable for the following:    Creatinine, Ser 0.48 (*)    All other components within normal limits  CBC WITH DIFFERENTIAL - Abnormal; Notable for the following:    RBC 3.77 (*)    All other components within normal limits   Results for orders placed or performed during the hospital encounter of 03/23/41  Basic metabolic panel  Result Value Ref Range   Sodium 137 135 - 145 mmol/L   Potassium 3.7 3.5 - 5.1 mmol/L   Chloride 103 96 - 112 mEq/L   CO2 28 19 - 32 mmol/L   Glucose, Bld 85 70 - 99 mg/dL   BUN 19 6 - 23 mg/dL   Creatinine, Ser 0.48 (L) 0.50 - 1.10 mg/dL   Calcium 9.2 8.4 - 10.5 mg/dL   GFR calc non Af Amer >90 >90 mL/min   GFR calc Af Amer >90 >90 mL/min   Anion gap 6 5 - 15  CBC with Differential  Result Value Ref Range   WBC 9.1 4.0 - 10.5 K/uL   RBC 3.77 (L) 3.87 - 5.11 MIL/uL   Hemoglobin 12.0 12.0 - 15.0 g/dL   HCT 36.6 36.0 - 46.0 %   MCV 97.1 78.0 - 100.0 fL   MCH 31.8 26.0 - 34.0 pg   MCHC 32.8 30.0 - 36.0 g/dL   RDW 12.0 11.5 - 15.5 %   Platelets 171 150 - 400 K/uL   Neutrophils Relative % 75 43 - 77 %   Neutro Abs 6.8 1.7 - 7.7 K/uL   Lymphocytes Relative 16 12 - 46 %   Lymphs Abs 1.5 0.7 - 4.0 K/uL   Monocytes Relative 8 3 - 12 %   Monocytes Absolute 0.8 0.1 - 1.0 K/uL   Eosinophils Relative 1 0 - 5 %   Eosinophils Absolute 0.1 0.0 - 0.7 K/uL   Basophils Relative 0 0 - 1 %   Basophils Absolute 0.0 0.0 - 0.1 K/uL  \  Imaging Review Dg Wrist Complete Right  03/08/2014   CLINICAL DATA:  Dog bite to RIGHT wrist yesterday at 1200 hr, RIGHT wrist pain, swelling and redness  EXAM: RIGHT WRIST - COMPLETE 3+ VIEW  COMPARISON:  None  FINDINGS: Mild osseous demineralization.  Joint spaces  preserved.  Mild soft tissue swelling greatest dorsally.  No definite acute fracture dislocation.  Small focal osseous lucency identified at the distal RIGHT radial metaphysis may be related to demineralization but if patient has a puncture wound overlying this site, an osseous injury with could not be excluded.  IMPRESSION: Focal osseous lucency at the distal radial metaphysis, potentially related to demineralization though if the patient has a puncture wound from a dog bite overlying this position a focal osseous injury/puncture could not be excluded.   Electronically Signed   By: Lavonia Dana M.D.   On: 03/08/2014 19:54     EKG Interpretation None      MDM   Final diagnoses:  Dog bite  Cellulitis of right upper extremity    Discussed with hand surgery Dr. Caralyn Guile. Patient received a dose of Unasyn here with improvement of the cellulitis in the right arm infection from the dog bite. Dr. Caralyn Guile willing to have patient go home with a splinted arm follow-up at [facility] emergency department this tomorrow at 8 in the morning are 9 in the morning to receive another dose of Unasyn by the emergency department and for the emergency department to call him to calm evaluate the arm. Patient is willing to do this and understands the plan. Patient's tetanus is up-to-date the dog's rabies shots are up-to-date. The concern for infection was increased by the fact that the patient has had a right mastectomy with lymph node removal.  Patient's labs here without any significant abnormalities. In addition x-rays are consistent with a penetrating bone injury from the dog bite to the distal radius.  Orthopedics aware of this.  I personally performed the services described in this documentation, which was scribed in my presence. The recorded information has been reviewed and is accurate.       Fredia Sorrow, MD 03/08/14 2111

## 2014-03-08 NOTE — ED Notes (Addendum)
Per Dr. Rogene Houston, Completed wound care, then I placed a short arm splint, supporting out to finger tips. I used Building control surveyor over cotton webril and sock material. I then secured fiberglass with kerlix wrap and ace.

## 2014-03-08 NOTE — Discharge Instructions (Signed)
As we discussed keep the splint on in place. Follow-up at [facility] emergency department between 8 and 9 tomorrow area Dr. Caralyn Guile is to be called to see you there. He wants to evaluate the infection in your right hand and arm. In addition you're to receive another dose of Unasyn 3 g IV piggyback at that time.

## 2014-03-09 ENCOUNTER — Encounter (HOSPITAL_COMMUNITY): Payer: Self-pay

## 2014-03-09 ENCOUNTER — Emergency Department (HOSPITAL_COMMUNITY)
Admission: EM | Admit: 2014-03-09 | Discharge: 2014-03-09 | Disposition: A | Discharge status: Home / Self Care | Payer: BLUE CROSS/BLUE SHIELD | Attending: Emergency Medicine | Admitting: Emergency Medicine

## 2014-03-09 DIAGNOSIS — Z8619 Personal history of other infectious and parasitic diseases: Secondary | ICD-10-CM | POA: Diagnosis not present

## 2014-03-09 DIAGNOSIS — Z79899 Other long term (current) drug therapy: Secondary | ICD-10-CM | POA: Insufficient documentation

## 2014-03-09 DIAGNOSIS — Z87891 Personal history of nicotine dependence: Secondary | ICD-10-CM | POA: Insufficient documentation

## 2014-03-09 DIAGNOSIS — E079 Disorder of thyroid, unspecified: Secondary | ICD-10-CM | POA: Insufficient documentation

## 2014-03-09 DIAGNOSIS — S61451D Open bite of right hand, subsequent encounter: Secondary | ICD-10-CM | POA: Diagnosis not present

## 2014-03-09 DIAGNOSIS — F329 Major depressive disorder, single episode, unspecified: Secondary | ICD-10-CM | POA: Insufficient documentation

## 2014-03-09 DIAGNOSIS — L089 Local infection of the skin and subcutaneous tissue, unspecified: Secondary | ICD-10-CM

## 2014-03-09 DIAGNOSIS — Z85828 Personal history of other malignant neoplasm of skin: Secondary | ICD-10-CM | POA: Insufficient documentation

## 2014-03-09 DIAGNOSIS — Z853 Personal history of malignant neoplasm of breast: Secondary | ICD-10-CM | POA: Insufficient documentation

## 2014-03-09 DIAGNOSIS — Z87410 Personal history of cervical dysplasia: Secondary | ICD-10-CM | POA: Diagnosis not present

## 2014-03-09 DIAGNOSIS — W540XXD Bitten by dog, subsequent encounter: Secondary | ICD-10-CM | POA: Insufficient documentation

## 2014-03-09 MED ORDER — AMOXICILLIN-POT CLAVULANATE 875-125 MG PO TABS: 1.0000 | ORAL_TABLET | Freq: Two times a day (BID) | ORAL | Status: DC

## 2014-03-09 MED ORDER — AMPICILLIN-SULBACTAM SODIUM 3 (2-1) G IJ SOLR
3.0000 g | Freq: Once | INTRAMUSCULAR | Status: AC
  Administered 2014-03-09: 3 g via INTRAVENOUS
  Filled 2014-03-09: qty 3

## 2014-03-09 NOTE — Discharge Instructions (Signed)

## 2014-03-09 NOTE — ED Notes (Signed)
Pt. Is here for antibiotic therapy and to see Dr. Apolonio Schneiders

## 2014-03-09 NOTE — Progress Notes (Signed)
Orthopedic Tech Progress Note Patient Details:  Kathryn Mcdaniel December 12, 1955 [id number] Volar applied to protect wrist area from prior dog bite. Application tolerated well. Capillary refill normal before and after. Ortho Devices Type of Ortho Device: Volar splint Ortho Device/Splint Location: RUE Ortho Device/Splint Interventions: Application   Asia R Thompson 03/09/2014, 11:55 AM

## 2014-03-09 NOTE — ED Provider Notes (Signed)
CSN: [id number]     Arrival date & time 03/09/14  0800 History   First MD Initiated Contact with Patient 03/09/14 [phone]     Chief Complaint  Patient presents with  . Wound Infection     (Consider location/radiation/quality/duration/timing/severity/associated sxs/prior Treatment) Patient is a 59 y.o. female presenting with hand injury. The history is provided by the patient. No language interpreter was used.  Hand Injury Location:  Hand Time since incident:  2 days Injury: yes   Mechanism of injury comment:  Dog bite Hand location:  R hand Pain details:    Quality:  Aching   Radiates to:  Does not radiate   Severity:  Mild   Progression:  Improving Tetanus status:  Up to date Pt was bitten by a friends dog.    Past Medical History  Diagnosis Date  . 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 Personal history of cervical dysplasia   . Unspecified personal history presenting hazards to health     Radiation Therapy  . 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 Basal cell carcinoma of skin     left temple  . Abnormal breast biopsy   . Depression   . Thyroid disease    Past Surgical History  Procedure Laterality Date  . Breast surgery      right mastectomy  . Breast biopsy    . Nasal sinus surgery      sinus stripping  . Mass excision Right 08/09/2013    Procedure: MINOR EXCISION OF MASS CHEST WALL;  Surgeon: [name], MD;  Location: [facility];  Service: General;  Laterality: Right;   Family History  Problem Relation Age of Onset  . Cancer Father     prostate  . Hypertension Father   . Cancer Brother   . Colon cancer Neg Hx   . Coronary artery disease Neg Hx   . Cancer Other     grandmother, breast cancer  . Breast cancer Paternal Grandmother   . Breast cancer Mother   . Squamous cell carcinoma Mother     sinus  . Diabetes Father   . Stroke  Maternal Grandmother   . Transient ischemic attack Mother    History  Substance Use Topics  . Smoking status: Former Smoker    Quit date: 09/15/1984  . Smokeless tobacco: Never Used  . Alcohol Use: 8.4 oz/week    14 Cans of beer per week     Comment: daily   OB History    No data available     Review of Systems  All other systems reviewed and are negative.     Allergies  Compazine and Metoclopramide hcl  Home Medications   Prior to Admission medications   Medication Sig Start Date End Date Taking? Authorizing Provider  ALPRAZolam Duanne Moron) 0.5 MG tablet Take 1 tablet (0.5 mg total) by mouth at bedtime as needed. 12/04/13  Yes Midge Minium, MD  Ascorbic Acid (VITAMIN C) 1000 MG tablet Take 1,000 mg by mouth daily.   Yes Historical Provider, MD  Calcium Carbonate-Vitamin D (CALTRATE 600+D) 600-400 MG-UNIT per tablet Take 1 tablet by mouth 2 (two) times daily. 05/04/12  Yes Yvonne R Lowne, DO  FLUoxetine (PROZAC) 20 MG capsule Take 3 capsules (60 mg total) by mouth daily. 12/20/13  Yes Yvonne R Lowne, DO  levothyroxine (SYNTHROID, LEVOTHROID) 75 MCG tablet TAKE 1 TABLET BY MOUTH  DAILY--RECHECK LABS IN 2 MONTHS 10/07/13  Yes Rosalita Chessman, DO  Multiple Vitamin (MULTIVITAMIN) tablet Take 1 tablet by mouth daily.   Yes Historical Provider, MD  vitamin E 400 UNIT capsule Take 400 Units by mouth daily.   Yes Historical Provider, MD   BP 123/72 mmHg  Pulse 83  Temp(Src) 97.9 F (36.6 C) (Oral)  Resp 14  SpO2 100% Physical Exam  Constitutional: She is oriented to person, place, and time. She appears well-developed and well-nourished.  HENT:  Head: Normocephalic.  Eyes: EOM are normal.  Neck: Normal range of motion.  Pulmonary/Chest: Effort normal.  Abdominal: She exhibits no distension.  Musculoskeletal: Normal range of motion.  Neurological: She is alert and oriented to person, place, and time.  Skin: Skin is warm. There is erythema.  Erythema to mid forearm,  No  edema, light red color,  Erythema around puncture sites,  No purulent drainage,  No streaking  Psychiatric: She has a normal mood and affect.  Nursing note and vitals reviewed.   ED Course  Procedures (including critical care time) Labs Review Labs Reviewed - No data to display  Imaging Review Dg Wrist Complete Right  03/08/2014   CLINICAL DATA:  Dog bite to RIGHT wrist yesterday at 1200 hr, RIGHT wrist pain, swelling and redness  EXAM: RIGHT WRIST - COMPLETE 3+ VIEW  COMPARISON:  None  FINDINGS: Mild osseous demineralization.  Joint spaces preserved.  Mild soft tissue swelling greatest dorsally.  No definite acute fracture dislocation.  Small focal osseous lucency identified at the distal RIGHT radial metaphysis may be related to demineralization but if patient has a puncture wound overlying this site, an osseous injury with could not be excluded.  IMPRESSION: Focal osseous lucency at the distal radial metaphysis, potentially related to demineralization though if the patient has a puncture wound from a dog bite overlying this position a focal osseous injury/puncture could not be excluded.   Electronically Signed   By: Lavonia Dana M.D.   On: 03/08/2014 19:54     EKG Interpretation None      MDM  Pt given Unasyn.   I called Dr. Caralyn Guile.   He advised resplint Have pt see him in the office on Tuesday for recheck. augmentin rx Pt agrees with paln.  Pt to return if symptoms worsen or cahnge   Final diagnoses:  Infection of right hand due to bite, subsequent encounter        Fransico Meadow, PA-C 03/09/14 [place] III, MD 03/10/14 [phone]

## 2014-03-09 NOTE — ED Notes (Signed)
Paged ortho tech 

## 2014-03-13 ENCOUNTER — Other Ambulatory Visit: Payer: Self-pay | Admitting: Dermatology

## 2014-05-06 ENCOUNTER — Other Ambulatory Visit (HOSPITAL_BASED_OUTPATIENT_CLINIC_OR_DEPARTMENT_OTHER): Payer: BLUE CROSS/BLUE SHIELD | Admitting: Lab

## 2014-05-06 ENCOUNTER — Other Ambulatory Visit: Payer: Self-pay | Admitting: Nurse Practitioner

## 2014-05-06 ENCOUNTER — Ambulatory Visit (HOSPITAL_BASED_OUTPATIENT_CLINIC_OR_DEPARTMENT_OTHER): Payer: BLUE CROSS/BLUE SHIELD | Admitting: Hematology & Oncology

## 2014-05-06 ENCOUNTER — Encounter: Payer: Self-pay | Admitting: Hematology & Oncology

## 2014-05-06 VITALS — BP 122/80 | HR 77 | Temp 98.1°F | Resp 18 | Ht 67.0 in | Wt 117.0 lb

## 2014-05-06 DIAGNOSIS — C50911 Malignant neoplasm of unspecified site of right female breast: Secondary | ICD-10-CM

## 2014-05-06 DIAGNOSIS — M25552 Pain in left hip: Secondary | ICD-10-CM

## 2014-05-06 DIAGNOSIS — R0781 Pleurodynia: Secondary | ICD-10-CM

## 2014-05-06 DIAGNOSIS — C50919 Malignant neoplasm of unspecified site of unspecified female breast: Secondary | ICD-10-CM

## 2014-05-06 DIAGNOSIS — M545 Low back pain: Secondary | ICD-10-CM

## 2014-05-06 DIAGNOSIS — Z853 Personal history of malignant neoplasm of breast: Secondary | ICD-10-CM

## 2014-05-06 LAB — CBC WITH DIFFERENTIAL (CANCER CENTER ONLY)
BASO#: 0 10*3/uL (ref 0.0–0.2)
BASO%: 0.2 % (ref 0.0–2.0)
EOS%: 4.7 % (ref 0.0–7.0)
Eosinophils Absolute: 0.2 10*3/uL (ref 0.0–0.5)
HEMATOCRIT: 34.5 % — AB (ref 34.8–46.6)
HEMOGLOBIN: 11.6 g/dL (ref 11.6–15.9)
LYMPH#: 1.4 10*3/uL (ref 0.9–3.3)
LYMPH%: 27.6 % (ref 14.0–48.0)
MCH: 32 pg (ref 26.0–34.0)
MCHC: 33.6 g/dL (ref 32.0–36.0)
MCV: 95 fL (ref 81–101)
MONO#: 0.4 10*3/uL (ref 0.1–0.9)
MONO%: 8.5 % (ref 0.0–13.0)
NEUT#: 3 10*3/uL (ref 1.5–6.5)
NEUT%: 59 % (ref 39.6–80.0)
Platelets: 160 10*3/uL (ref 145–400)
RBC: 3.62 10*6/uL — ABNORMAL LOW (ref 3.70–5.32)
RDW: 11.6 % (ref 11.1–15.7)
WBC: 5.1 10*3/uL (ref 3.9–10.0)

## 2014-05-07 ENCOUNTER — Telehealth: Payer: Self-pay | Admitting: Hematology & Oncology

## 2014-05-07 ENCOUNTER — Encounter: Payer: Self-pay | Admitting: Nurse Practitioner

## 2014-05-07 LAB — COMPREHENSIVE METABOLIC PANEL
ALT: 20 U/L (ref 0–35)
AST: 25 U/L (ref 0–37)
Albumin: 3.7 g/dL (ref 3.5–5.2)
Alkaline Phosphatase: 68 U/L (ref 39–117)
BUN: 17 mg/dL (ref 6–23)
CHLORIDE: 105 meq/L (ref 96–112)
CO2: 30 mEq/L (ref 19–32)
Calcium: 9.1 mg/dL (ref 8.4–10.5)
Creatinine, Ser: 0.55 mg/dL (ref 0.50–1.10)
Glucose, Bld: 85 mg/dL (ref 70–99)
Potassium: 4.2 mEq/L (ref 3.5–5.3)
SODIUM: 140 meq/L (ref 135–145)
TOTAL PROTEIN: 6 g/dL (ref 6.0–8.3)
Total Bilirubin: 0.3 mg/dL (ref 0.2–1.2)

## 2014-05-07 LAB — VITAMIN D 25 HYDROXY (VIT D DEFICIENCY, FRACTURES): Vit D, 25-Hydroxy: 43 ng/mL (ref 30–100)

## 2014-05-07 NOTE — Telephone Encounter (Signed)
Pt aware of 3-28 bone scan. MD ok with date of scan.

## 2014-05-07 NOTE — Progress Notes (Signed)
Hematology and Oncology Follow Up Visit  Kathryn Mcdaniel [id number] 1955-03-09 59 y.o. 05/07/2014   Principle Diagnosis:   Stage IIA (T1N1M0) ductal carcinoma of the right breast-ER positive/HER-2 negative  Current Therapy:    Observation     Interim History:  Ms. Kathryn Mcdaniel is back for first office visit area and she had been seen previously at the main [facility]. She was found to have invasive carcinoma back in September 2007. She underwent a mastectomy. She did receive neoadjuvant chemotherapy with Taxotere 4 cycles followed by O stents Cytoxan/Adriamycin for 4 cycles.  She had a mastectomy back in March 2008. She had 2 positive sentinel lymph nodes.  She received adjuvant radiation therapy. She also he was placed on Arimidex. She completed 5 years of therapy back in May 2014.  She now comes in to see as that she lives close by. She's never some pain issues. She's been having pain in the left hip, mid back, and right anterior lower ribs. This been going on for several weeks. She takes some over-the-counter medicine for partial relief.  She is not lost weight. She still active. She still exercises.  Her appetite has been good. She's had no nausea or vomiting. There's been no change in bowel or bladder habits. She has been getting her mammograms for the left breast on a routine basis. She has been getting routine gynecologic follow-up.   Medications:  Current outpatient prescriptions:  .  ALPRAZolam (XANAX) 0.5 MG tablet, Take 1 tablet (0.5 mg total) by mouth at bedtime as needed., Disp: 30 tablet, Rfl: 0 .  Ascorbic Acid (VITAMIN C) 1000 MG tablet, Take 1,000 mg by mouth daily., Disp: , Rfl:  .  Calcium Carbonate-Vitamin D (CALTRATE 600+D) 600-400 MG-UNIT per tablet, Take 1 tablet by mouth 2 (two) times daily., Disp: , Rfl:  .  FLUoxetine (PROZAC) 20 MG capsule, Take 3 capsules (60 mg total) by mouth daily., Disp: 90 capsule, Rfl: 5 .  levothyroxine (SYNTHROID, LEVOTHROID) 75 MCG  tablet, TAKE 1 TABLET BY MOUTH DAILY--RECHECK LABS IN 2 MONTHS, Disp: 90 tablet, Rfl: 3 .  Multiple Vitamin (MULTIVITAMIN) tablet, Take 1 tablet by mouth daily., Disp: , Rfl:  .  vitamin E 400 UNIT capsule, Take 400 Units by mouth daily., Disp: , Rfl:   Allergies:  Allergies  Allergen Reactions  . Compazine [Prochlorperazine Edisylate]     "bizarre, dystonic"  . Metoclopramide Hcl     Past Medical History, Surgical history, Social history, and Family History were reviewed and updated.  Review of Systems: As above  Physical Exam:  height is '5\' 7"'  (1.702 m) and weight is 117 lb (53.071 kg). Her oral temperature is 98.1 F (36.7 C). Her blood pressure is 122/80 and her pulse is 77. Her respiration is 18.   Wt Readings from Last 3 Encounters:  05/06/14 117 lb (53.071 kg)  03/08/14 118 lb (53.524 kg)  11/06/13 118 lb 9.7 oz (53.8 kg)     Thin but well-nourished white female in no obvious distress. Head and neck exam shows no ocular or oral lesions. She has no palpable cervical or supraclavicular lymph nodes. Lungs are clear bilaterally. Cardiac exam regular rate and rhythm with no murmurs, rubs or bruits. Abdomen is soft. She has good bowel sounds. There is no fluid wave. There is no palpable liver or spleen tip. Breast exam shows left breast no masses, edema or erythema. There is no left axillary adenopathy. Right chest wall shows well-healed mastectomy. No right chest wall nodules  are noted. There is no tenderness to palpation over the right anterior chest wall. There is no right axillary adenopathy. Back exam shows no tenderness over the spine, ribs or hips. Extremities shows no clubbing, cyanosis or edema. She has good range of motion of her joints. She has good strength in her extremities. Skin exam shows no rashes, ecchymoses or petechia. Neurological exam shows no focal neurological deficits.  Lab Results  Component Value Date   WBC 5.1 05/06/2014   HGB 11.6 05/06/2014   HCT 34.5*  05/06/2014   MCV 95 05/06/2014   PLT 160 05/06/2014     Chemistry      Component Value Date/Time   NA 140 05/06/2014 1149   NA 141 06/28/2012 1010   K 4.2 05/06/2014 1149   K 4.5 06/28/2012 1010   CL 105 05/06/2014 1149   CL 105 06/28/2012 1010   CO2 30 05/06/2014 1149   CO2 29 06/28/2012 1010   BUN 17 05/06/2014 1149   BUN 18.8 06/28/2012 1010   CREATININE 0.55 05/06/2014 1149   CREATININE 0.7 06/28/2012 1010   CREATININE 0.48* 05/04/2012 1640      Component Value Date/Time   CALCIUM 9.1 05/06/2014 1149   CALCIUM 9.4 06/28/2012 1010   ALKPHOS 68 05/06/2014 1149   ALKPHOS 50 06/28/2012 1010   AST 25 05/06/2014 1149   AST 28 06/28/2012 1010   ALT 20 05/06/2014 1149   ALT 22 06/28/2012 1010   BILITOT 0.3 05/06/2014 1149   BILITOT 0.36 06/28/2012 1010         Impression and Plan: Ms. Kathryn Mcdaniel is 59 year old white female. She has a history of stage IIa-node positive-ductal carcinoma the right breast. This was diagnosed back in 2007.  I think that we are going to have to do a bone scan on her. She is at risk for recurrence of her breast cancer.  She is taking vitamin D. Her vitamin D levels are okay.  I spent about 45 minutes with her. This was, in essence, a new patient appointment. I went over her labs. I explained my recommendations for the bone scan. She is agreement to have this done.  I don't think we have to see her back unless we find problems with the bone scan.   Volanda Napoleon, MD 3/16/20165:54 PM

## 2014-05-13 ENCOUNTER — Telehealth: Payer: Self-pay | Admitting: Family Medicine

## 2014-05-13 NOTE — Telephone Encounter (Signed)
Patient Name: Kathryn Mcdaniel  DOB: [birth year]    Initial Comment Caller states she is having shortness of breath, fatigue and nausea.    Nurse Assessment  Nurse: Mallie Mussel, RN, Alveta Heimlich Date/Time Eilene Ghazi Time): 05/13/2014 2:03:51 PM  Confirm and document reason for call. If symptomatic, describe symptoms. ---Caller states that for the past couple weeks she has had SOB, fatigue and nausea. She denies these symptoms at present. She usually has the SOB after exertion, but she is noticing it happening some when she is seated also. She had some nausea and fatigue yesterday that comes and goes every couple weeks. She is scheduled for a bone scan next week with an oncologist. She has a history of CA.  Has the patient traveled out of the country within the last 30 days? ---Yes  Where have you traveled? ([place] for Ebola and Ebola guideline, [place], [place] for MERS) ---[place]  Does the patient require triage? ---Yes  Related visit to physician within the last 2 weeks? ---No  Does the PT have any chronic conditions? (i.e. diabetes, asthma, etc.) ---Yes  List chronic conditions. ---Hypothyroidism, Depression/Anxiety     Guidelines    Guideline Title Affirmed Question Affirmed Notes  Breathing Difficulty [1] MILD longstanding difficulty breathing AND [2] SAME as normal off and on for "several weeks"   Final Disposition User   See PCP within [place], RN, Alveta Heimlich    Comments  Appointment scheduled for 05/19/14 at 2pm with Dr. Garnet Koyanagi.

## 2014-05-15 NOTE — Telephone Encounter (Signed)
Called patient to follow-up and left message on voicemail to return call. eal

## 2014-05-19 ENCOUNTER — Ambulatory Visit (HOSPITAL_BASED_OUTPATIENT_CLINIC_OR_DEPARTMENT_OTHER)
Admission: RE | Admit: 2014-05-19 | Discharge: 2014-05-19 | Disposition: A | Discharge status: Home / Self Care | Payer: BLUE CROSS/BLUE SHIELD | Source: Ambulatory Visit | Attending: Family Medicine | Admitting: Family Medicine

## 2014-05-19 ENCOUNTER — Ambulatory Visit (HOSPITAL_COMMUNITY): Payer: BLUE CROSS/BLUE SHIELD

## 2014-05-19 ENCOUNTER — Encounter (HOSPITAL_COMMUNITY): Payer: BLUE CROSS/BLUE SHIELD

## 2014-05-19 ENCOUNTER — Ambulatory Visit (INDEPENDENT_AMBULATORY_CARE_PROVIDER_SITE_OTHER): Payer: BLUE CROSS/BLUE SHIELD | Admitting: Family Medicine

## 2014-05-19 VITALS — BP 108/64 | HR 108 | Temp 99.3°F | Wt 117.6 lb

## 2014-05-19 DIAGNOSIS — R002 Palpitations: Secondary | ICD-10-CM | POA: Diagnosis not present

## 2014-05-19 DIAGNOSIS — R208 Other disturbances of skin sensation: Secondary | ICD-10-CM | POA: Diagnosis not present

## 2014-05-19 DIAGNOSIS — R0602 Shortness of breath: Secondary | ICD-10-CM | POA: Diagnosis not present

## 2014-05-19 DIAGNOSIS — Z853 Personal history of malignant neoplasm of breast: Secondary | ICD-10-CM | POA: Diagnosis not present

## 2014-05-19 DIAGNOSIS — Z9011 Acquired absence of right breast and nipple: Secondary | ICD-10-CM | POA: Insufficient documentation

## 2014-05-19 DIAGNOSIS — R200 Anesthesia of skin: Secondary | ICD-10-CM

## 2014-05-19 NOTE — Progress Notes (Signed)
Patient ID: Kathryn Mcdaniel, female    DOB: Jul 08, 1955  Age: 59 y.o. MRN: [id number]    Subjective  Subjective HPI [name] presents with c/o sob and palpitations x several weeks.  Episodes occur at least weekly.  No cp, +  Doe.  She also c/o numbness in both hands esp in am L >R.   Review of Systems  Constitutional: Negative for activity change, appetite change, fatigue and unexpected weight change.  Respiratory: Positive for shortness of breath. Negative for cough, chest tightness and wheezing.   Cardiovascular: Positive for palpitations. Negative for chest pain.  Neurological: Positive for numbness. Negative for tremors and weakness.  Psychiatric/Behavioral: Negative for suicidal ideas, behavioral problems, sleep disturbance and dysphoric mood. The patient is not nervous/anxious.     History Past Medical History  Diagnosis Date  . 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 Personal history of cervical dysplasia   . Unspecified personal history presenting hazards to health     Radiation Therapy  . 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 Basal cell carcinoma of skin     left temple  . Abnormal breast biopsy   . Depression   . Thyroid disease     She has past surgical history that includes Breast surgery; Breast biopsy; Nasal sinus surgery; and Mass excision (Right, 08/09/2013).   Her family history includes Breast cancer in her mother and paternal grandmother; Cancer in her brother, father, and other; Diabetes in her father; Hypertension in her father; Squamous cell carcinoma in her mother; Stroke in her maternal grandmother; Transient ischemic attack in her mother. There is no history of Colon cancer or Coronary artery disease.She reports that she quit smoking about 29 years ago. She has never used smokeless tobacco. She reports that she drinks about 8.4 oz of alcohol per week. She  reports that she does not use illicit drugs.  Current Outpatient Prescriptions on File Prior to Visit  Medication Sig Dispense Refill  . ALPRAZolam (XANAX) 0.5 MG tablet Take 1 tablet (0.5 mg total) by mouth at bedtime as needed. 30 tablet 0  . Ascorbic Acid (VITAMIN C) 1000 MG tablet Take 1,000 mg by mouth daily.    . Calcium Carbonate-Vitamin D (CALTRATE 600+D) 600-400 MG-UNIT per tablet Take 1 tablet by mouth 2 (two) times daily.    Marland Kitchen FLUoxetine (PROZAC) 20 MG capsule Take 3 capsules (60 mg total) by mouth daily. 90 capsule 5  . levothyroxine (SYNTHROID, LEVOTHROID) 75 MCG tablet TAKE 1 TABLET BY MOUTH DAILY--RECHECK LABS IN 2 MONTHS 90 tablet 3  . Multiple Vitamin (MULTIVITAMIN) tablet Take 1 tablet by mouth daily.    . vitamin E 400 UNIT capsule Take 400 Units by mouth daily.     No current facility-administered medications on file prior to visit.     Objective:  Objective Physical Exam  Constitutional: She is oriented to person, place, and time. She appears well-developed and well-nourished. No distress.  HENT:  Right Ear: External ear normal.  Left Ear: External ear normal.  Nose: Nose normal.  Mouth/Throat: Oropharynx is clear and moist.  Eyes: EOM are normal. Pupils are equal, round, and reactive to light.  Neck: Normal range of motion. Neck supple.  Cardiovascular: Normal rate, regular rhythm and normal heart sounds.   No murmur heard. Pulmonary/Chest: Effort normal and breath sounds normal. No respiratory distress. She has no wheezes. She has no rales. She exhibits no tenderness.  Neurological: She is alert and oriented to person, place, and time. She has normal reflexes. She displays normal reflexes. No cranial nerve deficit. She exhibits normal muscle tone. Coordination normal.  + numbness in hands and fingers b/l except 5 th finger and worse L middle  Psychiatric: She has a normal mood and affect. Her behavior is normal. Judgment and thought content normal.   BP 108/64  mmHg  Pulse 108  Temp(Src) 99.3 F (37.4 C) (Oral)  Wt 117 lb 9.6 oz (53.343 kg)  SpO2 99% Wt Readings from Last 3 Encounters:  05/19/14 117 lb 9.6 oz (53.343 kg)  05/06/14 117 lb (53.071 kg)  03/08/14 118 lb (53.524 kg)     Lab Results  Component Value Date   WBC 5.1 05/06/2014   HGB 11.6 05/06/2014   HCT 34.5* 05/06/2014   PLT 160 05/06/2014   GLUCOSE 85 05/06/2014   CHOL 158 09/21/2011   TRIG 54.0 09/21/2011   HDL 73.40 09/21/2011   LDLCALC 74 09/21/2011   ALT 20 05/06/2014   AST 25 05/06/2014   NA 140 05/06/2014   K 4.2 05/06/2014   CL 105 05/06/2014   CREATININE 0.55 05/06/2014   BUN 17 05/06/2014   CO2 30 05/06/2014   TSH 2.13 10/01/2013   INR 1.2 01/02/2006    No results found.   Assessment & Plan:  Plan I am having Ms. Ruscitti maintain her multivitamin, vitamin C, vitamin E, Calcium Carbonate-Vitamin D, levothyroxine, ALPRAZolam, and FLUoxetine.  No orders of the defined types were placed in this encounter.    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 ==if cp occurs go to [facility], DO

## 2014-05-19 NOTE — Assessment & Plan Note (Signed)
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

## 2014-05-19 NOTE — Progress Notes (Signed)
Pre visit review using our clinic review tool, if applicable. No additional management support is needed unless otherwise documented below in the visit note. 

## 2014-05-19 NOTE — Patient Instructions (Signed)

## 2014-05-20 LAB — TSH: TSH: 0.47 u[IU]/mL (ref 0.35–4.50)

## 2014-05-23 ENCOUNTER — Encounter (HOSPITAL_COMMUNITY)
Admission: RE | Admit: 2014-05-23 | Discharge: 2014-05-23 | Disposition: A | Discharge status: Home / Self Care | Payer: BLUE CROSS/BLUE SHIELD | Source: Ambulatory Visit | Attending: Hematology & Oncology | Admitting: Hematology & Oncology

## 2014-05-23 ENCOUNTER — Telehealth: Payer: Self-pay

## 2014-05-23 DIAGNOSIS — C50911 Malignant neoplasm of unspecified site of right female breast: Secondary | ICD-10-CM | POA: Insufficient documentation

## 2014-05-23 MED ORDER — TECHNETIUM TC 99M MEDRONATE IV KIT
26.5000 | PACK | Freq: Once | INTRAVENOUS | Status: AC | PRN
  Administered 2014-05-23: 26.5 via INTRAVENOUS

## 2014-05-23 NOTE — Telephone Encounter (Addendum)
-----   Message from Volanda Napoleon, MD sent at 05/23/2014  4:24 PM EDT ----- Call - bone scan looks ok!!  Nothing to suggest cancer!!  Kathryn Mcdaniel  General message left on generic VM for pt to contact office for results. dph

## 2014-06-10 ENCOUNTER — Other Ambulatory Visit: Payer: Self-pay | Admitting: Dermatology

## 2014-07-23 ENCOUNTER — Encounter: Payer: Self-pay | Admitting: Internal Medicine

## 2014-09-28 ENCOUNTER — Other Ambulatory Visit: Payer: Self-pay | Admitting: Family Medicine

## 2014-09-29 ENCOUNTER — Ambulatory Visit (INDEPENDENT_AMBULATORY_CARE_PROVIDER_SITE_OTHER): Payer: BLUE CROSS/BLUE SHIELD | Admitting: Family Medicine

## 2014-09-29 ENCOUNTER — Other Ambulatory Visit: Payer: Self-pay | Admitting: Family Medicine

## 2014-09-29 ENCOUNTER — Encounter: Payer: Self-pay | Admitting: Family Medicine

## 2014-09-29 VITALS — BP 120/70 | HR 69 | Temp 98.7°F | Ht 67.0 in | Wt 119.4 lb

## 2014-09-29 DIAGNOSIS — N6320 Unspecified lump in the left breast, unspecified quadrant: Secondary | ICD-10-CM

## 2014-09-29 DIAGNOSIS — N63 Unspecified lump in breast: Secondary | ICD-10-CM | POA: Diagnosis not present

## 2014-09-29 DIAGNOSIS — E039 Hypothyroidism, unspecified: Secondary | ICD-10-CM | POA: Diagnosis not present

## 2014-09-29 DIAGNOSIS — Z853 Personal history of malignant neoplasm of breast: Secondary | ICD-10-CM

## 2014-09-29 MED ORDER — LEVOTHYROXINE SODIUM 75 MCG PO TABS: ORAL_TABLET | ORAL | Status: DC

## 2014-09-29 NOTE — Progress Notes (Signed)
Subjective:    Patient ID: Kathryn Mcdaniel, female    DOB: [birth year], 59 y.o.   MRN: [id number]  HPI  Patient here for mass in left breast that is new.-- she first noticed 2 weeks ago.  She has been told in the past that her breast are very dense and that a mammogram was useless.     Past Medical History  Diagnosis Date  . 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 Personal history of cervical dysplasia   . Unspecified personal history presenting hazards to health     Radiation Therapy  . 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 Basal cell carcinoma of skin     left temple  . Abnormal breast biopsy   . Depression   . Thyroid disease     Review of Systems  Constitutional: Negative for diaphoresis, appetite change, fatigue and unexpected weight change.  Eyes: Negative for pain, redness and visual disturbance.  Respiratory: Negative for cough, chest tightness, shortness of breath and wheezing.   Cardiovascular: Negative for chest pain, palpitations and leg swelling.  Endocrine: Negative for cold intolerance, heat intolerance, polydipsia, polyphagia and polyuria.  Genitourinary: Negative for dysuria, frequency and difficulty urinating.  Neurological: Negative for dizziness, light-headedness, numbness and headaches.  Psychiatric/Behavioral: Negative for decreased concentration and agitation.    Current Outpatient Prescriptions on File Prior to Visit  Medication Sig Dispense Refill  . ALPRAZolam (XANAX) 0.5 MG tablet Take 1 tablet (0.5 mg total) by mouth at bedtime as needed. 30 tablet 0  . Ascorbic Acid (VITAMIN C) 1000 MG tablet Take 1,000 mg by mouth daily.    . Calcium Carbonate-Vitamin D (CALTRATE 600+D) 600-400 MG-UNIT per tablet Take 1 tablet by mouth 2 (two) times daily.    Marland Kitchen FLUoxetine (PROZAC) 20 MG capsule Take 3 capsules (60 mg total) by mouth daily. 90 capsule 5  . Multiple  Vitamin (MULTIVITAMIN) tablet Take 1 tablet by mouth daily.    . vitamin E 400 UNIT capsule Take 400 Units by mouth daily.     No current facility-administered medications on file prior to visit.       Objective:    Physical Exam  Constitutional: She is oriented to person, place, and time. She appears well-developed and well-nourished.  HENT:  Head: Normocephalic and atraumatic.  Eyes: Conjunctivae and EOM are normal.  Neck: Normal range of motion. Neck supple. No JVD present. Carotid bruit is not present.  Cardiovascular: Regular rhythm.   Pulmonary/Chest: Effort normal and breath sounds normal. Right breast exhibits no inverted nipple, no mass, no nipple discharge, no skin change and no tenderness. Left breast exhibits mass. Left breast exhibits no inverted nipple, no nipple discharge, no skin change and no tenderness.    Neurological: She is alert and oriented to person, place, and time.  Psychiatric: She has a normal mood and affect. Her behavior is normal.    BP 120/70 mmHg  Pulse 69  Temp(Src) 98.7 F (37.1 C) (Oral)  Ht 5\' 7"  (1.702 m)  Wt 119 lb 6.4 oz (54.159 kg)  BMI 18.70 kg/m2  SpO2 97% Wt Readings from Last 3 Encounters:  09/29/14 119 lb 6.4 oz (54.159 kg)  05/19/14 117 lb 9.6 oz (53.343 kg)  05/06/14 117 lb (53.071 kg)     Lab Results  Component Value Date   WBC 5.1 05/06/2014   HGB 11.6 05/06/2014   HCT 34.5* 05/06/2014  PLT 160 05/06/2014   GLUCOSE 85 05/06/2014   CHOL 158 09/21/2011   TRIG 54.0 09/21/2011   HDL 73.40 09/21/2011   LDLCALC 74 09/21/2011   ALT 20 05/06/2014   AST 25 05/06/2014   NA 140 05/06/2014   K 4.2 05/06/2014   CL 105 05/06/2014   CREATININE 0.55 05/06/2014   BUN 17 05/06/2014   CO2 30 05/06/2014   TSH 0.47 05/19/2014   INR 1.2 01/02/2006       Assessment & Plan:   Problem List Items Addressed This Visit    None    Visit Diagnoses    History of breast cancer    -  Primary    Relevant Orders    MM Breast  Specific Gamma Imaging    Breast mass, left        Relevant Orders    MM Breast Specific Gamma Imaging    MM DIAG BREAST TOMO UNI LEFT    Hypothyroidism, unspecified hypothyroidism type        Relevant Medications    levothyroxine (SYNTHROID, LEVOTHROID) 75 MCG tablet       I have changed Ms. Carstarphen's levothyroxine. I am also having her maintain her multivitamin, vitamin C, vitamin E, Calcium Carbonate-Vitamin D, ALPRAZolam, FLUoxetine, and cholecalciferol.  Meds ordered this encounter  Medications  . cholecalciferol (VITAMIN D) 1000 UNITS tablet    Sig: Take 1,000 Units by mouth daily.  Marland Kitchen levothyroxine (SYNTHROID, LEVOTHROID) 75 MCG tablet    Sig: 1 po qd    Dispense:  90 tablet    Refill:  [place], DO

## 2014-09-29 NOTE — Progress Notes (Signed)
Pre visit review using our clinic review tool, if applicable. No additional management support is needed unless otherwise documented below in the visit note. 

## 2014-09-29 NOTE — Patient Instructions (Signed)
BRCA-1 and BRCA-2 BRCA-1 and BRCA-2 are 2 genes that are linked with hereditary breast and ovarian cancers. About 200,000 women are diagnosed with invasive breast cancer each year and about 23,000 with ovarian cancer (according to the American Cancer Society). Of these cancers, about 5% to 10% will be due to a mutation in one of the BRCA genes. Men can also inherit an increased risk of developing breast cancer, primarily from an alteration in the BRCA-2 gene.  Individuals with mutations in BRCA1 or BRCA2 have significantly elevated risks for breast cancer (up to 80% lifetime risk), ovarian cancer (up to 40% lifetime risk), bilateral breast cancer and other types of cancers. BRCA mutations are inherited and passed from generation to generation. One half of the time, they are passed from the father's side of the family.  The DNA in white blood cells is used to detect mutations in the BRCA genes. While the gene products (proteins) of the BRCA genes act only in breast and ovarian tissue, the genes are present in every cell of the body and blood is the most easily accessible source of that DNA. PREPARATION FOR TEST The test for BRCA mutations is done on a blood sample collected by needle from a vein in the arm. The test does not require surgical biopsy of breast or ovarian tissue.  NORMAL FINDINGS No genetic mutations. Ranges for normal findings may vary among different laboratories and hospitals. You should always check with your doctor after having lab work or other tests done to discuss the meaning of your test results and whether your values are considered within normal limits. MEANING OF TEST  Your caregiver will go over the test results with you and discuss the importance and meaning of your results, as well as treatment options and the need for additional tests if necessary. OBTAINING THE TEST RESULTS It is your responsibility to obtain your test results. Ask the lab or department performing the test  when and how you will get your results. OTHER THINGS TO KNOW Your test results may have implications for other family members. When one member of a family is tested for BRCA mutations, issues often arise about how or whether to share this information with other family members. Seek advice from a genetic counselor about communication of result with your family members.  Pre and post test consultation with a health care provider knowledgeable about genetic testing cannot be overemphasized.  There are many issues to be considered when preparing for a genetic test and upon learning the results, and a genetic counselor has the knowledge and experience to help you sort through them.  If the BRCA test is positive, the options include increased frequency of check-ups (e.g., mammography, blood tests for CA-125, or transvaginal ultrasonography); medications that could reduce risk (e.g., oral contraceptives or tamoxifen); or surgical removal of the ovaries or breasts. There are a number of variables involved and it is important to discuss your options with your doctor and genetic counselor. Research studies have reported that for every 1000 women negative for BRCA mutations, between 12 and 45 of them will develop breast cancer by age 50 and between 3 and 4 will develop ovarian cancer by age 50. The risk increases with age. The test can be ordered by a doctor, preferably by one who can also offer genetic counseling. The blood sample will be sent to a laboratory that specializes in BRCA testing. The American Society of Clinical Oncology and the National Breast Cancer Coalition encourage women seeking the   test to participate in long-term outcome studies to help gather information on the effectiveness of different check-up and treatment options. Document Released: 03/03/2004 Document Revised: 05/02/2011 Document Reviewed: 05/10/2013 [employer] Patient Information 2015 [place], [place]. This information is not intended to  replace advice given to you by your health care provider. Make sure you discuss any questions you have with your health care provider.

## 2014-10-02 ENCOUNTER — Other Ambulatory Visit: Payer: Self-pay | Admitting: Family Medicine

## 2014-10-02 ENCOUNTER — Ambulatory Visit
Admission: RE | Admit: 2014-10-02 | Discharge: 2014-10-02 | Disposition: A | Discharge status: Home / Self Care | Payer: BLUE CROSS/BLUE SHIELD | Source: Ambulatory Visit | Attending: Family Medicine | Admitting: Family Medicine

## 2014-10-02 DIAGNOSIS — N6320 Unspecified lump in the left breast, unspecified quadrant: Secondary | ICD-10-CM

## 2014-10-03 ENCOUNTER — Encounter: Payer: Self-pay | Admitting: Family Medicine

## 2014-10-06 ENCOUNTER — Other Ambulatory Visit: Payer: Self-pay | Admitting: Family Medicine

## 2014-10-06 DIAGNOSIS — R928 Other abnormal and inconclusive findings on diagnostic imaging of breast: Secondary | ICD-10-CM

## 2014-10-15 ENCOUNTER — Ambulatory Visit
Admission: RE | Admit: 2014-10-15 | Discharge: 2014-10-15 | Disposition: A | Discharge status: Home / Self Care | Payer: BLUE CROSS/BLUE SHIELD | Source: Ambulatory Visit | Attending: Family Medicine | Admitting: Family Medicine

## 2014-10-15 DIAGNOSIS — R928 Other abnormal and inconclusive findings on diagnostic imaging of breast: Secondary | ICD-10-CM

## 2014-10-15 MED ORDER — GADOBENATE DIMEGLUMINE 529 MG/ML IV SOLN
10.0000 mL | Freq: Once | INTRAVENOUS | Status: AC | PRN
  Administered 2014-10-15: 10 mL via INTRAVENOUS

## 2014-10-20 ENCOUNTER — Encounter: Payer: Self-pay | Admitting: Family Medicine

## 2015-03-13 ENCOUNTER — Other Ambulatory Visit: Payer: Self-pay | Admitting: Family Medicine

## 2015-03-13 DIAGNOSIS — N6320 Unspecified lump in the left breast, unspecified quadrant: Secondary | ICD-10-CM

## 2015-04-20 ENCOUNTER — Ambulatory Visit
Admission: RE | Admit: 2015-04-20 | Discharge: 2015-04-20 | Disposition: A | Discharge status: Home / Self Care | Payer: BLUE CROSS/BLUE SHIELD | Source: Ambulatory Visit | Attending: Family Medicine | Admitting: Family Medicine

## 2015-04-20 DIAGNOSIS — N6320 Unspecified lump in the left breast, unspecified quadrant: Secondary | ICD-10-CM

## 2015-05-26 ENCOUNTER — Encounter: Payer: Self-pay | Admitting: Family Medicine

## 2015-05-26 ENCOUNTER — Ambulatory Visit (INDEPENDENT_AMBULATORY_CARE_PROVIDER_SITE_OTHER): Payer: BLUE CROSS/BLUE SHIELD | Admitting: Family Medicine

## 2015-05-26 VITALS — BP 104/68 | HR 71 | Temp 98.8°F | Wt 115.0 lb

## 2015-05-26 DIAGNOSIS — R300 Dysuria: Secondary | ICD-10-CM | POA: Diagnosis not present

## 2015-05-26 LAB — POC URINALSYSI DIPSTICK (AUTOMATED)
Bilirubin, UA: NEGATIVE
Blood, UA: NEGATIVE
Glucose, UA: NEGATIVE
Ketones, UA: NEGATIVE
NITRITE UA: NEGATIVE
PH UA: 6
PROTEIN UA: NEGATIVE
Spec Grav, UA: 1.01
UROBILINOGEN UA: 2

## 2015-05-26 MED ORDER — CIPROFLOXACIN HCL 250 MG PO TABS: 250.0000 mg | ORAL_TABLET | Freq: Two times a day (BID) | ORAL | Status: DC

## 2015-05-26 NOTE — Progress Notes (Signed)
Pre visit review using our clinic review tool, if applicable. No additional management support is needed unless otherwise documented below in the visit note. 

## 2015-05-26 NOTE — Patient Instructions (Signed)

## 2015-05-26 NOTE — Progress Notes (Signed)
Patient ID: Kathryn Mcdaniel, female    DOB: 15-Jul-1955  Age: 60 y.o. MRN: [id number]    Subjective:  Subjective HPI Kathryn Mcdaniel presents for strong smelling urine and dysuria.  No fever.     Review of Systems  Constitutional: Negative for diaphoresis, appetite change, fatigue and unexpected weight change.  Eyes: Negative for pain, redness and visual disturbance.  Respiratory: Negative for cough, chest tightness, shortness of breath and wheezing.   Cardiovascular: Negative for chest pain, palpitations and leg swelling.  Endocrine: Negative for cold intolerance, heat intolerance, polydipsia, polyphagia and polyuria.  Genitourinary: Negative for dysuria, frequency and difficulty urinating.  Neurological: Negative for dizziness, light-headedness, numbness and headaches.    History Past Medical History  Diagnosis Date  . 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 Personal history of cervical dysplasia   . Unspecified personal history presenting hazards to health     Radiation Therapy  . 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 Basal cell carcinoma of skin     left temple  . Abnormal breast biopsy   . Depression   . Thyroid disease     She has past surgical history that includes Breast surgery; Breast biopsy; Nasal sinus surgery; and Mass excision (Right, 08/09/2013).   Her family history includes Breast cancer in her mother and paternal grandmother; Cancer in her brother, father, and other; Diabetes in her father; Hypertension in her father; Squamous cell carcinoma in her mother; Stroke in her maternal grandmother; Transient ischemic attack in her mother. There is no history of Colon cancer or Coronary artery disease.She reports that she quit smoking about 30 years ago. She has never used smokeless tobacco. She reports that she drinks about 8.4 oz of alcohol per week. She reports that she does  not use illicit drugs.  Current Outpatient Prescriptions on File Prior to Visit  Medication Sig Dispense Refill  . ALPRAZolam (XANAX) 0.5 MG tablet Take 1 tablet (0.5 mg total) by mouth at bedtime as needed. 30 tablet 0  . Ascorbic Acid (VITAMIN C) 1000 MG tablet Take 1,000 mg by mouth daily.    . Calcium Carbonate-Vitamin D (CALTRATE 600+D) 600-400 MG-UNIT per tablet Take 1 tablet by mouth 2 (two) times daily.    . cholecalciferol (VITAMIN D) 1000 UNITS tablet Take 1,000 Units by mouth daily.    Marland Kitchen FLUoxetine (PROZAC) 20 MG capsule Take 3 capsules (60 mg total) by mouth daily. 90 capsule 5  . levothyroxine (SYNTHROID, LEVOTHROID) 75 MCG tablet 1 po qd 90 tablet 3  . Multiple Vitamin (MULTIVITAMIN) tablet Take 1 tablet by mouth daily.    . vitamin E 400 UNIT capsule Take 400 Units by mouth daily.     No current facility-administered medications on file prior to visit.     Objective:  Objective Physical Exam  Constitutional: She is oriented to person, place, and time. She appears well-developed and well-nourished.  HENT:  Head: Normocephalic and atraumatic.  Eyes: Conjunctivae and EOM are normal.  Neck: Normal range of motion. Neck supple. No JVD present. Carotid bruit is not present. No thyromegaly present.  Cardiovascular: Normal rate, regular rhythm and normal heart sounds.   No murmur heard. Pulmonary/Chest: Effort normal and breath sounds normal. No respiratory distress. She has no wheezes. She has no rales. She exhibits no tenderness.  Genitourinary: No vaginal discharge found.  Musculoskeletal: She exhibits no edema.  Neurological: She is alert and oriented to  person, place, and time.  Psychiatric: She has a normal mood and affect.  Nursing note and vitals reviewed.  BP 104/68 mmHg  Pulse 71  Temp(Src) 98.8 F (37.1 C) (Oral)  Wt 115 lb (52.164 kg)  SpO2 99% Wt Readings from Last 3 Encounters:  05/26/15 115 lb (52.164 kg)  09/29/14 119 lb 6.4 oz (54.159 kg)  05/19/14  117 lb 9.6 oz (53.343 kg)     Lab Results  Component Value Date   WBC 5.1 05/06/2014   HGB 11.6 05/06/2014   HCT 34.5* 05/06/2014   PLT 160 05/06/2014   GLUCOSE 85 05/06/2014   CHOL 158 09/21/2011   TRIG 54.0 09/21/2011   HDL 73.40 09/21/2011   LDLCALC 74 09/21/2011   ALT 20 05/06/2014   AST 25 05/06/2014   NA 140 05/06/2014   K 4.2 05/06/2014   CL 105 05/06/2014   CREATININE 0.55 05/06/2014   BUN 17 05/06/2014   CO2 30 05/06/2014   TSH 0.47 05/19/2014   INR 1.2 01/02/2006    US Breast Boyce Axilla  04/20/2015  CLINICAL DATA:  Six month follow-up of probably benign findings in the left breast. The patient is 60 years old and has a history of right breast cancer, status post right mastectomy. She has palpated a pea-sized lump in the axillary tail of the left breast since approximately August of 2016. She states that this small palpable area is unchanged since she was evaluated in August 2016. She had a breast MRI performed in new August 2016, which was negative. I reviewed this study today. She has had genetic testing, and she reports that she was negative for the BRCA mutations. EXAM: DIGITAL DIAGNOSTIC LEFT MAMMOGRAM WITH 3D TOMOSYNTHESIS WITH CAD ULTRASOUND LEFT BREAST COMPARISON:  10/02/2014, 11/15/2013, 09/20/2011, 09/15/2010, breast MRI 10/15/2014. ACR Breast Density Category d: The breast tissue is extremely dense, which lowers the sensitivity of mammography. FINDINGS: No mass, distortion, or suspicious microcalcification is identified in the left breast to suggest malignancy. The breast parenchyma is extremely dense. Mammographic images were processed with CAD. On physical exam, I do palpate an approximately 5 to 10 mm focal superficial area of ropey elongated slight firmness. This is not a definite mass on physical exam. I examined the patient in the upright sitting and lying positions. Targeted ultrasound is performed, showing dense fibroglandular tissue in the region  of patient concern, and in the surrounding upper-outer left breast. No solid or cystic breast mass or skin lesion is seen on ultrasound. There is no abnormal shadowing or evidence of distortion. IMPRESSION: No evidence of malignancy in the left breast on mammography, and no suspicious findings on focused ultrasound in the region of the patient's superficial palpable lump in the axillary tail of the left breast. RECOMMENDATION: Screening mammogram of the left breast is recommended in 1 year. Given the patient's personal history of breast cancer and extremely dense breast parenchyma, consider a screening breast MRI at the same time as the patient's screening mammogram. This was discussed with the patient today. I have discussed the findings and recommendations with the patient. Results were also provided in writing at the conclusion of the visit. If applicable, a reminder letter will be sent to the patient regarding the next appointment. BI-RADS CATEGORY  1: Negative. Electronically Signed   By: Curlene Dolphin M.D.   On: 04/20/2015 14:39   Mm Diag Breast Tomo Uni Left  04/20/2015  CLINICAL DATA:  Six month follow-up of probably benign findings in  the left breast. The patient is 60 years old and has a history of right breast cancer, status post right mastectomy. She has palpated a pea-sized lump in the axillary tail of the left breast since approximately August of 2016. She states that this small palpable area is unchanged since she was evaluated in August 2016. She had a breast MRI performed in new August 2016, which was negative. I reviewed this study today. She has had genetic testing, and she reports that she was negative for the BRCA mutations. EXAM: DIGITAL DIAGNOSTIC LEFT MAMMOGRAM WITH 3D TOMOSYNTHESIS WITH CAD ULTRASOUND LEFT BREAST COMPARISON:  10/02/2014, 11/15/2013, 09/20/2011, 09/15/2010, breast MRI 10/15/2014. ACR Breast Density Category d: The breast tissue is extremely dense, which lowers the  sensitivity of mammography. FINDINGS: No mass, distortion, or suspicious microcalcification is identified in the left breast to suggest malignancy. The breast parenchyma is extremely dense. Mammographic images were processed with CAD. On physical exam, I do palpate an approximately 5 to 10 mm focal superficial area of ropey elongated slight firmness. This is not a definite mass on physical exam. I examined the patient in the upright sitting and lying positions. Targeted ultrasound is performed, showing dense fibroglandular tissue in the region of patient concern, and in the surrounding upper-outer left breast. No solid or cystic breast mass or skin lesion is seen on ultrasound. There is no abnormal shadowing or evidence of distortion. IMPRESSION: No evidence of malignancy in the left breast on mammography, and no suspicious findings on focused ultrasound in the region of the patient's superficial palpable lump in the axillary tail of the left breast. RECOMMENDATION: Screening mammogram of the left breast is recommended in 1 year. Given the patient's personal history of breast cancer and extremely dense breast parenchyma, consider a screening breast MRI at the same time as the patient's screening mammogram. This was discussed with the patient today. I have discussed the findings and recommendations with the patient. Results were also provided in writing at the conclusion of the visit. If applicable, a reminder letter will be sent to the patient regarding the next appointment. BI-RADS CATEGORY  1: Negative. Electronically Signed   By: Curlene Dolphin M.D.   On: 04/20/2015 14:39     Assessment & Plan:  Plan I am having Ms. Kostka start on ciprofloxacin. I am also having her maintain her multivitamin, vitamin C, vitamin E, Calcium Carbonate-Vitamin D, ALPRAZolam, FLUoxetine, cholecalciferol, and levothyroxine.  Meds ordered this encounter  Medications  . ciprofloxacin (CIPRO) 250 MG tablet    Sig: Take 1 tablet  (250 mg total) by mouth 2 (two) times daily.    Dispense:  6 tablet    Refill:  0    Problem List Items Addressed This Visit    None    Visit Diagnoses    Dysuria    -  Primary    Relevant Medications    ciprofloxacin (CIPRO) 250 MG tablet    Other Relevant Orders    POCT Urinalysis Dipstick (Automated) (Completed)    Urine Culture       Follow-up: Return if symptoms worsen or fail to improve.  [name], DO

## 2015-05-28 LAB — URINE CULTURE
Colony Count: NO GROWTH
Organism ID, Bacteria: NO GROWTH

## 2015-06-19 ENCOUNTER — Ambulatory Visit (INDEPENDENT_AMBULATORY_CARE_PROVIDER_SITE_OTHER): Payer: BLUE CROSS/BLUE SHIELD | Admitting: Family Medicine

## 2015-06-19 ENCOUNTER — Encounter: Payer: Self-pay | Admitting: Family Medicine

## 2015-06-19 VITALS — BP 102/67 | HR 76 | Temp 98.9°F | Ht 67.0 in | Wt 115.0 lb

## 2015-06-19 DIAGNOSIS — S40812A Abrasion of left upper arm, initial encounter: Secondary | ICD-10-CM

## 2015-06-19 DIAGNOSIS — S60512A Abrasion of left hand, initial encounter: Secondary | ICD-10-CM | POA: Diagnosis not present

## 2015-06-19 MED ORDER — CEPHALEXIN 500 MG PO CAPS: 500.0000 mg | ORAL_CAPSULE | Freq: Three times a day (TID) | ORAL | Status: AC

## 2015-06-19 NOTE — Progress Notes (Signed)
   Subjective:    Patient ID: Kathryn Mcdaniel, female    DOB: 07/03/1955, 60 y.o.   MRN: [id number]  HPI Here for some scratches on the left hand and arm which occurred at home yesterday. She was cleaning some debris out of a gutter and caught a sharp edge. It is slightly sore today.    Review of Systems  Constitutional: Negative.   Skin: Positive for wound.       Objective:   Physical Exam  Constitutional: She appears well-developed and well-nourished.  Skin:  2 superficial scratches, one on dorsal left forearm and one on dorsal left hand           Assessment & Plan:  Abrasions, cover with Keflex.  Laurey Morale, MD

## 2015-06-19 NOTE — Progress Notes (Signed)
Pre visit review using our clinic review tool, if applicable. No additional management support is needed unless otherwise documented below in the visit note. 

## 2015-07-02 DIAGNOSIS — H5203 Hypermetropia, bilateral: Secondary | ICD-10-CM | POA: Diagnosis not present

## 2015-09-08 ENCOUNTER — Other Ambulatory Visit: Payer: Self-pay

## 2015-09-08 ENCOUNTER — Other Ambulatory Visit (INDEPENDENT_AMBULATORY_CARE_PROVIDER_SITE_OTHER): Payer: BLUE CROSS/BLUE SHIELD

## 2015-09-08 DIAGNOSIS — R946 Abnormal results of thyroid function studies: Secondary | ICD-10-CM

## 2015-09-08 DIAGNOSIS — R7989 Other specified abnormal findings of blood chemistry: Secondary | ICD-10-CM

## 2015-09-08 LAB — TSH: TSH: 6.12 u[IU]/mL — ABNORMAL HIGH (ref 0.35–4.50)

## 2015-09-12 ENCOUNTER — Other Ambulatory Visit: Payer: Self-pay | Admitting: Family Medicine

## 2015-09-12 DIAGNOSIS — E038 Other specified hypothyroidism: Secondary | ICD-10-CM

## 2015-09-12 MED ORDER — LEVOTHYROXINE SODIUM 88 MCG PO TABS: 88.0000 ug | ORAL_TABLET | Freq: Every day | ORAL | Status: DC

## 2015-09-14 ENCOUNTER — Other Ambulatory Visit: Payer: Self-pay | Admitting: Family Medicine

## 2015-09-14 DIAGNOSIS — E039 Hypothyroidism, unspecified: Secondary | ICD-10-CM

## 2015-09-15 ENCOUNTER — Telehealth: Payer: Self-pay | Admitting: Family Medicine

## 2015-09-15 ENCOUNTER — Ambulatory Visit: Payer: BLUE CROSS/BLUE SHIELD | Admitting: Family Medicine

## 2015-09-15 NOTE — Telephone Encounter (Signed)
no

## 2015-09-15 NOTE — Telephone Encounter (Signed)
Pt lvm at 8:41 stating that she is having a water heater problem and will have repairs today. Pt says that she will need to be home waiting for plumbers. Pt will not be at her 1:00 appt today.   Should pt be charged?

## 2015-10-09 ENCOUNTER — Ambulatory Visit (INDEPENDENT_AMBULATORY_CARE_PROVIDER_SITE_OTHER): Payer: BLUE CROSS/BLUE SHIELD | Admitting: Family Medicine

## 2015-10-09 VITALS — BP 118/70 | HR 66 | Temp 98.6°F | Wt 117.0 lb

## 2015-10-09 DIAGNOSIS — Z1159 Encounter for screening for other viral diseases: Secondary | ICD-10-CM

## 2015-10-09 DIAGNOSIS — E039 Hypothyroidism, unspecified: Secondary | ICD-10-CM | POA: Diagnosis not present

## 2015-10-09 DIAGNOSIS — E038 Other specified hypothyroidism: Secondary | ICD-10-CM | POA: Diagnosis not present

## 2015-10-09 NOTE — Patient Instructions (Signed)

## 2015-10-09 NOTE — Progress Notes (Signed)
Patient ID: Kathryn Mcdaniel, female    DOB: 06-23-1955  Age: 60 y.o. MRN: [id number]    Subjective:  Subjective  HPI Kathryn Mcdaniel presents for f/u thyroid.    We increased the synthroid to 88 mcg qd 4 weeks ago.      Review of Systems  Constitutional: Negative for appetite change, diaphoresis, fatigue and unexpected weight change.  Eyes: Negative for pain, redness and visual disturbance.  Respiratory: Negative for cough, chest tightness, shortness of breath and wheezing.   Cardiovascular: Negative for chest pain, palpitations and leg swelling.  Endocrine: Negative for cold intolerance, heat intolerance, polydipsia, polyphagia and polyuria.  Genitourinary: Negative for difficulty urinating, dysuria and frequency.  Neurological: Negative for dizziness, light-headedness, numbness and headaches.    History Past Medical History:  Diagnosis Date  . Abnormal breast biopsy   . Basal cell carcinoma of skin    left temple  . Depression   . 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 Personal history of cervical dysplasia   . Thyroid disease   . Unspecified personal history presenting hazards to health    Radiation Therapy    She has a past surgical history that includes Breast surgery; Breast biopsy; Nasal sinus surgery; and Mass excision (Right, 08/09/2013).   Her family history includes Breast cancer in her mother and paternal grandmother; Cancer in her brother, father, and other; Diabetes in her father; Hypertension in her father; Squamous cell carcinoma in her mother; Stroke in her maternal grandmother; Transient ischemic attack in her mother.She reports that she quit smoking about 31 years ago. She has never used smokeless tobacco. She reports that she drinks about 8.4 oz of alcohol per week . She reports that she does not use drugs.  Current Outpatient  Prescriptions on File Prior to Visit  Medication Sig Dispense Refill  . ALPRAZolam (XANAX) 0.5 MG tablet Take 1 tablet (0.5 mg total) by mouth at bedtime as needed. 30 tablet 0  . Ascorbic Acid (VITAMIN C) 1000 MG tablet Take 1,000 mg by mouth daily.    . Calcium Carbonate-Vitamin D (CALTRATE 600+D) 600-400 MG-UNIT per tablet Take 1 tablet by mouth 2 (two) times daily.    . cholecalciferol (VITAMIN D) 1000 UNITS tablet Take 1,000 Units by mouth daily.    Marland Kitchen FLUoxetine (PROZAC) 20 MG capsule Take 3 capsules (60 mg total) by mouth daily. 90 capsule 5  . levothyroxine (SYNTHROID, LEVOTHROID) 88 MCG tablet Take 1 tablet (88 mcg total) by mouth daily. 30 tablet 2  . levothyroxine (SYNTHROID, LEVOTHROID) 88 MCG tablet Take 1 tablet (88 mcg total) by mouth daily. 30 tablet 2  . Multiple Vitamin (MULTIVITAMIN) tablet Take 1 tablet by mouth daily.    . vitamin E 400 UNIT capsule Take 400 Units by mouth daily.     No current facility-administered medications on file prior to visit.      Objective:  Objective  Physical Exam  Constitutional: She is oriented to person, place, and time. She appears well-developed and well-nourished.  HENT:  Head: Normocephalic and atraumatic.  Eyes: Conjunctivae and EOM are normal.  Neck: Normal range of motion. Neck supple. No JVD present. Carotid bruit is not present. No thyromegaly present.  Cardiovascular: Normal rate, regular rhythm and normal heart sounds.   No murmur heard. Pulmonary/Chest: Effort normal and breath sounds normal. No respiratory distress. She has no wheezes. She has no  rales. She exhibits no tenderness.  Musculoskeletal: She exhibits no edema.  Neurological: She is alert and oriented to person, place, and time.  Psychiatric: She has a normal mood and affect.  Nursing note and vitals reviewed.  BP 118/70 (BP Location: Left Arm, Patient Position: Sitting, Cuff Size: Normal)   Pulse 66   Temp 98.6 F (37 C) (Oral)   Wt 117 lb (53.1 kg)   SpO2  98%   BMI 18.32 kg/m  Wt Readings from Last 3 Encounters:  10/09/15 117 lb (53.1 kg)  06/19/15 115 lb (52.2 kg)  05/26/15 115 lb (52.2 kg)     Lab Results  Component Value Date   WBC 5.1 05/06/2014   HGB 11.6 05/06/2014   HCT 34.5 (L) 05/06/2014   PLT 160 05/06/2014   GLUCOSE 85 05/06/2014   CHOL 158 09/21/2011   TRIG 54.0 09/21/2011   HDL 73.40 09/21/2011   LDLCALC 74 09/21/2011   ALT 20 05/06/2014   AST 25 05/06/2014   NA 140 05/06/2014   K 4.2 05/06/2014   CL 105 05/06/2014   CREATININE 0.55 05/06/2014   BUN 17 05/06/2014   CO2 30 05/06/2014   TSH 6.12 (H) 09/08/2015   INR 1.2 01/02/2006    US Breast Ltd Uni Left Inc Axilla  Result Date: 04/20/2015 CLINICAL DATA:  Six month follow-up of probably benign findings in the left breast. The patient is 60 years old and has a history of right breast cancer, status post right mastectomy. She has palpated a pea-sized lump in the axillary tail of the left breast since approximately August of 2016. She states that this small palpable area is unchanged since she was evaluated in August 2016. She had a breast MRI performed in new August 2016, which was negative. I reviewed this study today. She has had genetic testing, and she reports that she was negative for the BRCA mutations. EXAM: DIGITAL DIAGNOSTIC LEFT MAMMOGRAM WITH 3D TOMOSYNTHESIS WITH CAD ULTRASOUND LEFT BREAST COMPARISON:  10/02/2014, 11/15/2013, 09/20/2011, 09/15/2010, breast MRI 10/15/2014. ACR Breast Density Category d: The breast tissue is extremely dense, which lowers the sensitivity of mammography. FINDINGS: No mass, distortion, or suspicious microcalcification is identified in the left breast to suggest malignancy. The breast parenchyma is extremely dense. Mammographic images were processed with CAD. On physical exam, I do palpate an approximately 5 to 10 mm focal superficial area of ropey elongated slight firmness. This is not a definite mass on physical exam. I examined  the patient in the upright sitting and lying positions. Targeted ultrasound is performed, showing dense fibroglandular tissue in the region of patient concern, and in the surrounding upper-outer left breast. No solid or cystic breast mass or skin lesion is seen on ultrasound. There is no abnormal shadowing or evidence of distortion. IMPRESSION: No evidence of malignancy in the left breast on mammography, and no suspicious findings on focused ultrasound in the region of the patient's superficial palpable lump in the axillary tail of the left breast. RECOMMENDATION: Screening mammogram of the left breast is recommended in 1 year. Given the patient's personal history of breast cancer and extremely dense breast parenchyma, consider a screening breast MRI at the same time as the patient's screening mammogram. This was discussed with the patient today. I have discussed the findings and recommendations with the patient. Results were also provided in writing at the conclusion of the visit. If applicable, a reminder letter will be sent to the patient regarding the next appointment. BI-RADS CATEGORY  1:  Negative. Electronically Signed   By: Curlene Dolphin M.D.   On: 04/20/2015 14:39   Mm Diag Breast Tomo Uni Left  Result Date: 04/20/2015 CLINICAL DATA:  Six month follow-up of probably benign findings in the left breast. The patient is 60 years old and has a history of right breast cancer, status post right mastectomy. She has palpated a pea-sized lump in the axillary tail of the left breast since approximately August of 2016. She states that this small palpable area is unchanged since she was evaluated in August 2016. She had a breast MRI performed in new August 2016, which was negative. I reviewed this study today. She has had genetic testing, and she reports that she was negative for the BRCA mutations. EXAM: DIGITAL DIAGNOSTIC LEFT MAMMOGRAM WITH 3D TOMOSYNTHESIS WITH CAD ULTRASOUND LEFT BREAST COMPARISON:  10/02/2014,  11/15/2013, 09/20/2011, 09/15/2010, breast MRI 10/15/2014. ACR Breast Density Category d: The breast tissue is extremely dense, which lowers the sensitivity of mammography. FINDINGS: No mass, distortion, or suspicious microcalcification is identified in the left breast to suggest malignancy. The breast parenchyma is extremely dense. Mammographic images were processed with CAD. On physical exam, I do palpate an approximately 5 to 10 mm focal superficial area of ropey elongated slight firmness. This is not a definite mass on physical exam. I examined the patient in the upright sitting and lying positions. Targeted ultrasound is performed, showing dense fibroglandular tissue in the region of patient concern, and in the surrounding upper-outer left breast. No solid or cystic breast mass or skin lesion is seen on ultrasound. There is no abnormal shadowing or evidence of distortion. IMPRESSION: No evidence of malignancy in the left breast on mammography, and no suspicious findings on focused ultrasound in the region of the patient's superficial palpable lump in the axillary tail of the left breast. RECOMMENDATION: Screening mammogram of the left breast is recommended in 1 year. Given the patient's personal history of breast cancer and extremely dense breast parenchyma, consider a screening breast MRI at the same time as the patient's screening mammogram. This was discussed with the patient today. I have discussed the findings and recommendations with the patient. Results were also provided in writing at the conclusion of the visit. If applicable, a reminder letter will be sent to the patient regarding the next appointment. BI-RADS CATEGORY  1: Negative. Electronically Signed   By: Curlene Dolphin M.D.   On: 04/20/2015 14:39     Assessment & Plan:  Plan  I am having Ms. Saric maintain her multivitamin, vitamin C, vitamin E, Calcium Carbonate-Vitamin D, ALPRAZolam, FLUoxetine, cholecalciferol, levothyroxine, and  levothyroxine.  No orders of the defined types were placed in this encounter.   Problem List Items Addressed This Visit      Unprioritized   Hypothyroid    con't 42mg and recheck tsh in another month       Other Visit Diagnoses    Hypothyroidism, adult    -  Primary   Relevant Orders   Thyroid Panel With TSH   Need for hepatitis C screening test       Relevant Orders   Hepatitis C Antibody      Follow-up: Return in about 6 months (around 04/10/2016) for annual exam, fasting.  YAnn Held DO

## 2015-10-09 NOTE — Progress Notes (Signed)
Pre visit review using our clinic review tool, if applicable. No additional management support is needed unless otherwise documented below in the visit note. 

## 2015-10-10 ENCOUNTER — Encounter: Payer: Self-pay | Admitting: Family Medicine

## 2015-10-10 DIAGNOSIS — E039 Hypothyroidism, unspecified: Secondary | ICD-10-CM | POA: Insufficient documentation

## 2015-10-10 NOTE — Assessment & Plan Note (Signed)
con't 75mcg and recheck tsh in another month

## 2015-10-19 DIAGNOSIS — L603 Nail dystrophy: Secondary | ICD-10-CM | POA: Diagnosis not present

## 2015-10-19 DIAGNOSIS — B351 Tinea unguium: Secondary | ICD-10-CM | POA: Diagnosis not present

## 2015-10-19 DIAGNOSIS — L814 Other melanin hyperpigmentation: Secondary | ICD-10-CM | POA: Diagnosis not present

## 2015-11-09 ENCOUNTER — Ambulatory Visit (INDEPENDENT_AMBULATORY_CARE_PROVIDER_SITE_OTHER): Payer: BLUE CROSS/BLUE SHIELD | Admitting: Family Medicine

## 2015-11-09 ENCOUNTER — Encounter: Payer: Self-pay | Admitting: Family Medicine

## 2015-11-09 VITALS — BP 113/78 | HR 77 | Temp 98.6°F | Resp 16 | Ht 67.0 in | Wt 115.0 lb

## 2015-11-09 DIAGNOSIS — R5381 Other malaise: Secondary | ICD-10-CM | POA: Diagnosis not present

## 2015-11-09 DIAGNOSIS — E038 Other specified hypothyroidism: Secondary | ICD-10-CM

## 2015-11-09 DIAGNOSIS — R5383 Other fatigue: Secondary | ICD-10-CM | POA: Diagnosis not present

## 2015-11-09 NOTE — Progress Notes (Signed)
Pre visit review using our clinic review tool, if applicable. No additional management support is needed unless otherwise documented below in the visit note. 

## 2015-11-09 NOTE — Patient Instructions (Signed)
Fatigue  Fatigue is feeling tired all of the time, a lack of energy, or a lack of motivation. Occasional or mild fatigue is often a normal response to activity or life in general. However, long-lasting (chronic) or extreme fatigue may indicate an underlying medical condition.  HOME CARE INSTRUCTIONS   Watch your fatigue for any changes. The following actions may help to lessen any discomfort you are feeling:  · Talk to your health care provider about how much sleep you need each night. Try to get the required amount every night.  · Take medicines only as directed by your health care provider.  · Eat a healthy and nutritious diet. Ask your health care provider if you need help changing your diet.  · Drink enough fluid to keep your urine clear or pale yellow.  · Practice ways of relaxing, such as yoga, meditation, massage therapy, or acupuncture.  · Exercise regularly.    · Change situations that cause you stress. Try to keep your work and personal routine reasonable.  · Do not abuse illegal drugs.  · Limit alcohol intake to no more than 1 drink per day for nonpregnant women and 2 drinks per day for men. One drink equals 12 ounces of beer, 5 ounces of wine, or 1½ ounces of hard liquor.  · Take a multivitamin, if directed by your health care provider.  SEEK MEDICAL CARE IF:   · Your fatigue does not get better.  · You have a fever.    · You have unintentional weight loss or gain.  · You have headaches.    · You have difficulty:      Falling asleep.    Sleeping throughout the night.  · You feel angry, guilty, anxious, or sad.     · You are unable to have a bowel movement (constipation).    · You skin is dry.     · Your legs or another part of your body is swollen.    SEEK IMMEDIATE MEDICAL CARE IF:   · You feel confused.    · Your vision is blurry.  · You feel faint or pass out.    · You have a severe headache.    · You have severe abdominal, pelvic, or back pain.    · You have chest pain, shortness of breath, or an  irregular or fast heartbeat.    · You are unable to urinate or you urinate less than normal.    · You develop abnormal bleeding, such as bleeding from the rectum, vagina, nose, lungs, or nipples.  · You vomit blood.     · You have thoughts about harming yourself or committing suicide.    · You are worried that you might harm someone else.       This information is not intended to replace advice given to you by your health care provider. Make sure you discuss any questions you have with your health care provider.     Document Released: 12/05/2006 Document Revised: 02/28/2014 Document Reviewed: 06/11/2013  Elsevier Interactive Patient Education ©2016 [employer]

## 2015-11-09 NOTE — Progress Notes (Signed)
Patient ID: Kathryn Mcdaniel, female    DOB: [birth year]  Age: 60 y.o. MRN: [id number]    Subjective:  Subjective  HPI Kathryn Mcdaniel presents for c/o fatigue.    Pt is requesting that her thyroid be rechecked-- no other complaints  Review of Systems  Constitutional: Positive for fatigue. Negative for appetite change, diaphoresis and unexpected weight change.  Eyes: Negative for pain, redness and visual disturbance.  Respiratory: Negative for cough, chest tightness, shortness of breath and wheezing.   Cardiovascular: Negative for chest pain, palpitations and leg swelling.  Endocrine: Negative for cold intolerance, heat intolerance, polydipsia, polyphagia and polyuria.  Genitourinary: Negative for difficulty urinating, dysuria and frequency.  Neurological: Negative for dizziness, light-headedness, numbness and headaches.    History Past Medical History:  Diagnosis Date  . Abnormal breast biopsy   . Basal cell carcinoma of skin    left temple  . Depression   . 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 Personal history of cervical dysplasia   . Thyroid disease   . Unspecified personal history presenting hazards to health    Radiation Therapy    She has a past surgical history that includes Breast surgery; Breast biopsy; Nasal sinus surgery; and Mass excision (Right, 08/09/2013).   Her family history includes Breast cancer in her mother and paternal grandmother; Cancer in her brother, father, and other; Diabetes in her father; Hypertension in her father; Squamous cell carcinoma in her mother; Stroke in her maternal grandmother; Transient ischemic attack in her mother.She reports that she quit smoking about 31 years ago. She has never used smokeless tobacco. She reports that she drinks about 8.4 oz of alcohol per week . She reports that she does not use  drugs.  Current Outpatient Prescriptions on File Prior to Visit  Medication Sig Dispense Refill  . ALPRAZolam (XANAX) 0.5 MG tablet Take 1 tablet (0.5 mg total) by mouth at bedtime as needed. 30 tablet 0  . Ascorbic Acid (VITAMIN C) 1000 MG tablet Take 1,000 mg by mouth daily.    . Calcium Carbonate-Vitamin D (CALTRATE 600+D) 600-400 MG-UNIT per tablet Take 1 tablet by mouth 2 (two) times daily.    . cholecalciferol (VITAMIN D) 1000 UNITS tablet Take 1,000 Units by mouth daily.    Marland Kitchen FLUoxetine (PROZAC) 20 MG capsule Take 3 capsules (60 mg total) by mouth daily. 90 capsule 5  . levothyroxine (SYNTHROID, LEVOTHROID) 88 MCG tablet Take 1 tablet (88 mcg total) by mouth daily. 30 tablet 2  . Multiple Vitamin (MULTIVITAMIN) tablet Take 1 tablet by mouth daily.    . vitamin E 400 UNIT capsule Take 400 Units by mouth daily.     No current facility-administered medications on file prior to visit.      Objective:  Objective  Physical Exam  Constitutional: She is oriented to person, place, and time. She appears well-developed and well-nourished.  HENT:  Head: Normocephalic and atraumatic.  Eyes: Conjunctivae and EOM are normal.  Neck: Normal range of motion. Neck supple. No JVD present. Carotid bruit is not present. No thyromegaly present.  Cardiovascular: Normal rate, regular rhythm and normal heart sounds.   No murmur heard. Pulmonary/Chest: Effort normal and breath sounds normal. No respiratory distress. She has no wheezes. She has no rales. She exhibits no tenderness.  Musculoskeletal: She exhibits no edema.  Neurological: She is alert and oriented to person, place, and  time.  Psychiatric: She has a normal mood and affect. Her behavior is normal. Judgment and thought content normal.  Nursing note and vitals reviewed.  BP 113/78 (BP Location: Left Arm, Patient Position: Sitting, Cuff Size: Normal)   Pulse 77   Temp 98.6 F (37 C) (Oral)   Resp 16   Ht '5\' 7"'  (1.702 m)   Wt 115 lb (52.2  kg)   SpO2 96%   BMI 18.01 kg/m  Wt Readings from Last 3 Encounters:  11/09/15 115 lb (52.2 kg)  10/09/15 117 lb (53.1 kg)  06/19/15 115 lb (52.2 kg)     Lab Results  Component Value Date   WBC 5.1 05/06/2014   HGB 11.6 05/06/2014   HCT 34.5 (L) 05/06/2014   PLT 160 05/06/2014   GLUCOSE 85 05/06/2014   CHOL 158 09/21/2011   TRIG 54.0 09/21/2011   HDL 73.40 09/21/2011   LDLCALC 74 09/21/2011   ALT 20 05/06/2014   AST 25 05/06/2014   NA 140 05/06/2014   K 4.2 05/06/2014   CL 105 05/06/2014   CREATININE 0.55 05/06/2014   BUN 17 05/06/2014   CO2 30 05/06/2014   TSH 3.77 11/09/2015   INR 1.2 01/02/2006    US Breast Ltd Uni Left Inc Axilla  Result Date: 04/20/2015 CLINICAL DATA:  Six month follow-up of probably benign findings in the left breast. The patient is 60 years old and has a history of right breast cancer, status post right mastectomy. She has palpated a pea-sized lump in the axillary tail of the left breast since approximately August of 2016. She states that this small palpable area is unchanged since she was evaluated in August 2016. She had a breast MRI performed in new August 2016, which was negative. I reviewed this study today. She has had genetic testing, and she reports that she was negative for the BRCA mutations. EXAM: DIGITAL DIAGNOSTIC LEFT MAMMOGRAM WITH 3D TOMOSYNTHESIS WITH CAD ULTRASOUND LEFT BREAST COMPARISON:  10/02/2014, 11/15/2013, 09/20/2011, 09/15/2010, breast MRI 10/15/2014. ACR Breast Density Category d: The breast tissue is extremely dense, which lowers the sensitivity of mammography. FINDINGS: No mass, distortion, or suspicious microcalcification is identified in the left breast to suggest malignancy. The breast parenchyma is extremely dense. Mammographic images were processed with CAD. On physical exam, I do palpate an approximately 5 to 10 mm focal superficial area of ropey elongated slight firmness. This is not a definite mass on physical exam. I  examined the patient in the upright sitting and lying positions. Targeted ultrasound is performed, showing dense fibroglandular tissue in the region of patient concern, and in the surrounding upper-outer left breast. No solid or cystic breast mass or skin lesion is seen on ultrasound. There is no abnormal shadowing or evidence of distortion. IMPRESSION: No evidence of malignancy in the left breast on mammography, and no suspicious findings on focused ultrasound in the region of the patient's superficial palpable lump in the axillary tail of the left breast. RECOMMENDATION: Screening mammogram of the left breast is recommended in 1 year. Given the patient's personal history of breast cancer and extremely dense breast parenchyma, consider a screening breast MRI at the same time as the patient's screening mammogram. This was discussed with the patient today. I have discussed the findings and recommendations with the patient. Results were also provided in writing at the conclusion of the visit. If applicable, a reminder letter will be sent to the patient regarding the next appointment. BI-RADS CATEGORY  1: Negative. Electronically Signed  By: Curlene Dolphin M.D.   On: 04/20/2015 14:39   Mm Diag Breast Tomo Uni Left  Result Date: 04/20/2015 CLINICAL DATA:  Six month follow-up of probably benign findings in the left breast. The patient is 60 years old and has a history of right breast cancer, status post right mastectomy. She has palpated a pea-sized lump in the axillary tail of the left breast since approximately August of 2016. She states that this small palpable area is unchanged since she was evaluated in August 2016. She had a breast MRI performed in new August 2016, which was negative. I reviewed this study today. She has had genetic testing, and she reports that she was negative for the BRCA mutations. EXAM: DIGITAL DIAGNOSTIC LEFT MAMMOGRAM WITH 3D TOMOSYNTHESIS WITH CAD ULTRASOUND LEFT BREAST COMPARISON:   10/02/2014, 11/15/2013, 09/20/2011, 09/15/2010, breast MRI 10/15/2014. ACR Breast Density Category d: The breast tissue is extremely dense, which lowers the sensitivity of mammography. FINDINGS: No mass, distortion, or suspicious microcalcification is identified in the left breast to suggest malignancy. The breast parenchyma is extremely dense. Mammographic images were processed with CAD. On physical exam, I do palpate an approximately 5 to 10 mm focal superficial area of ropey elongated slight firmness. This is not a definite mass on physical exam. I examined the patient in the upright sitting and lying positions. Targeted ultrasound is performed, showing dense fibroglandular tissue in the region of patient concern, and in the surrounding upper-outer left breast. No solid or cystic breast mass or skin lesion is seen on ultrasound. There is no abnormal shadowing or evidence of distortion. IMPRESSION: No evidence of malignancy in the left breast on mammography, and no suspicious findings on focused ultrasound in the region of the patient's superficial palpable lump in the axillary tail of the left breast. RECOMMENDATION: Screening mammogram of the left breast is recommended in 1 year. Given the patient's personal history of breast cancer and extremely dense breast parenchyma, consider a screening breast MRI at the same time as the patient's screening mammogram. This was discussed with the patient today. I have discussed the findings and recommendations with the patient. Results were also provided in writing at the conclusion of the visit. If applicable, a reminder letter will be sent to the patient regarding the next appointment. BI-RADS CATEGORY  1: Negative. Electronically Signed   By: Curlene Dolphin M.D.   On: 04/20/2015 14:39     Assessment & Plan:  Plan  I am having Ms. Boese maintain her multivitamin, vitamin C, vitamin E, Calcium Carbonate-Vitamin D, ALPRAZolam, FLUoxetine, cholecalciferol, and  levothyroxine.  No orders of the defined types were placed in this encounter.   Problem List Items Addressed This Visit      Unprioritized   Hypothyroid    Other Visit Diagnoses    Malaise and fatigue    -  Primary   Relevant Orders   Thyroid Panel With TSH (Completed)   CBC with Differential/Platelet   Comprehensive metabolic panel   Vitamin M73   Vitamin D (25 hydroxy)   Epstein-Barr virus VCA antibody panel (Completed)    con't vitamins con't healthy diet and exercise  Follow-up: No Follow-up on file.  [name], DO

## 2015-11-10 LAB — COMPREHENSIVE METABOLIC PANEL
ALBUMIN: 4.1 g/dL (ref 3.5–5.2)
ALT: 22 U/L (ref 0–35)
AST: 24 U/L (ref 0–37)
Alkaline Phosphatase: 48 U/L (ref 39–117)
BILIRUBIN TOTAL: 0.5 mg/dL (ref 0.2–1.2)
BUN: 23 mg/dL (ref 6–23)
CALCIUM: 9.1 mg/dL (ref 8.4–10.5)
CHLORIDE: 102 meq/L (ref 96–112)
CO2: 33 meq/L — AB (ref 19–32)
Creatinine, Ser: 0.63 mg/dL (ref 0.40–1.20)
GFR: 103.2 mL/min (ref >60.00)
Glucose, Bld: 80 mg/dL (ref 70–99)
Potassium: 4.3 mEq/L (ref 3.5–5.1)
Sodium: 138 mEq/L (ref 135–145)
Total Protein: 6.6 g/dL (ref 6.0–8.3)

## 2015-11-10 LAB — CBC WITH DIFFERENTIAL/PLATELET
BASOS PCT: 0.4 % (ref 0.0–3.0)
Basophils Absolute: 0 10*3/uL (ref 0.0–0.1)
EOS PCT: 3.5 % (ref 0.0–5.0)
Eosinophils Absolute: 0.2 10*3/uL (ref 0.0–0.7)
HEMATOCRIT: 36.2 % (ref 36.0–46.0)
HEMOGLOBIN: 12.7 g/dL (ref 12.0–15.0)
LYMPHS PCT: 26.9 % (ref 12.0–46.0)
Lymphs Abs: 1.5 10*3/uL (ref 0.7–4.0)
MCHC: 35.2 g/dL (ref 30.0–36.0)
MCV: 93.3 fl (ref 78.0–100.0)
MONOS PCT: 6.3 % (ref 3.0–12.0)
Monocytes Absolute: 0.4 10*3/uL (ref 0.1–1.0)
NEUTROS ABS: 3.5 10*3/uL (ref 1.4–7.7)
Neutrophils Relative %: 62.9 % (ref 43.0–77.0)
Platelets: 181 10*3/uL (ref 150.0–400.0)
RBC: 3.88 Mil/uL (ref 3.87–5.11)
RDW: 12.8 % (ref 11.5–15.5)
WBC: 5.6 10*3/uL (ref 4.0–10.5)

## 2015-11-10 LAB — VITAMIN B12: Vitamin B-12: 780 pg/mL (ref 211–911)

## 2015-11-10 LAB — EPSTEIN-BARR VIRUS VCA ANTIBODY PANEL
EBV NA IgG: 465 U/mL — ABNORMAL HIGH
EBV VCA IGG: 123 U/mL — AB

## 2015-11-10 LAB — VITAMIN D 25 HYDROXY (VIT D DEFICIENCY, FRACTURES): VITD: 43.61 ng/mL (ref 30.00–100.00)

## 2015-11-10 LAB — THYROID PANEL WITH TSH
Free Thyroxine Index: 2.5 (ref 1.4–3.8)
T3 UPTAKE: 36 % — AB (ref 22–35)
T4 TOTAL: 6.9 ug/dL (ref 4.5–12.0)
TSH: 3.77 mIU/L

## 2015-11-12 ENCOUNTER — Other Ambulatory Visit: Payer: Self-pay

## 2015-11-12 DIAGNOSIS — E038 Other specified hypothyroidism: Secondary | ICD-10-CM

## 2015-11-12 MED ORDER — LEVOTHYROXINE SODIUM 88 MCG PO TABS: 88.0000 ug | ORAL_TABLET | Freq: Every day | ORAL | 1 refills | Status: DC

## 2015-11-13 ENCOUNTER — Encounter: Payer: Self-pay | Admitting: Family Medicine

## 2016-05-11 ENCOUNTER — Other Ambulatory Visit: Payer: Self-pay | Admitting: Family Medicine

## 2016-05-11 DIAGNOSIS — Z1231 Encounter for screening mammogram for malignant neoplasm of breast: Secondary | ICD-10-CM

## 2016-05-28 ENCOUNTER — Other Ambulatory Visit: Payer: Self-pay | Admitting: Family Medicine

## 2016-05-28 DIAGNOSIS — E038 Other specified hypothyroidism: Secondary | ICD-10-CM

## 2016-05-31 ENCOUNTER — Other Ambulatory Visit: Payer: Self-pay | Admitting: Family Medicine

## 2016-05-31 DIAGNOSIS — E038 Other specified hypothyroidism: Secondary | ICD-10-CM

## 2016-06-01 ENCOUNTER — Ambulatory Visit
Admission: RE | Admit: 2016-06-01 | Discharge: 2016-06-01 | Disposition: A | Discharge status: Home / Self Care | Payer: BLUE CROSS/BLUE SHIELD | Source: Ambulatory Visit | Attending: Family Medicine | Admitting: Family Medicine

## 2016-06-01 DIAGNOSIS — Z1231 Encounter for screening mammogram for malignant neoplasm of breast: Secondary | ICD-10-CM | POA: Diagnosis not present

## 2016-06-01 HISTORY — DX: Malignant neoplasm of unspecified site of unspecified female breast: C50.919

## 2016-09-19 DIAGNOSIS — Z681 Body mass index (BMI) 19 or less, adult: Secondary | ICD-10-CM | POA: Diagnosis not present

## 2016-09-19 DIAGNOSIS — Z01419 Encounter for gynecological examination (general) (routine) without abnormal findings: Secondary | ICD-10-CM | POA: Diagnosis not present

## 2016-10-18 ENCOUNTER — Ambulatory Visit (INDEPENDENT_AMBULATORY_CARE_PROVIDER_SITE_OTHER): Payer: BLUE CROSS/BLUE SHIELD | Admitting: Family Medicine

## 2016-10-18 ENCOUNTER — Encounter: Payer: Self-pay | Admitting: Family Medicine

## 2016-10-18 VITALS — BP 108/70 | HR 70 | Temp 98.0°F | Resp 14 | Ht 67.0 in | Wt 116.4 lb

## 2016-10-18 DIAGNOSIS — Z1159 Encounter for screening for other viral diseases: Secondary | ICD-10-CM | POA: Diagnosis not present

## 2016-10-18 DIAGNOSIS — M545 Low back pain, unspecified: Secondary | ICD-10-CM

## 2016-10-18 DIAGNOSIS — Z0000 Encounter for general adult medical examination without abnormal findings: Secondary | ICD-10-CM

## 2016-10-18 DIAGNOSIS — E559 Vitamin D deficiency, unspecified: Secondary | ICD-10-CM | POA: Diagnosis not present

## 2016-10-18 DIAGNOSIS — E039 Hypothyroidism, unspecified: Secondary | ICD-10-CM | POA: Diagnosis not present

## 2016-10-18 DIAGNOSIS — G8929 Other chronic pain: Secondary | ICD-10-CM | POA: Diagnosis not present

## 2016-10-18 NOTE — Progress Notes (Signed)
Subjective:     Kathryn Mcdaniel is a 61 y.o. female and is here for a comprehensive physical exam. The patient reports low back pain left side x 6 weeks.  No known injury Pt also c/o irritating cough and it feels like a "flake" is in her throat x 2 weeks   Social History   Social History  . Marital status: Single    Spouse name: N/A  . Number of children: N/A  . Years of education: N/A   Occupational History  . self employed    Social History Main Topics  . Smoking status: Former Smoker    Quit date: 09/15/1984  . Smokeless tobacco: Never Used  . Alcohol use 8.4 oz/week    14 Cans of beer per week     Comment: daily  . Drug use: No  . Sexual activity: Not on file   Other Topics Concern  . Not on file   Social History Narrative   Single   Work; Therapist, occupational, has a dog walking business   Exercise-- swim, walking dogs and bike   Health Maintenance  Topic Date Due  . Hepatitis C Screening  16-Jan-1956  . HIV Screening  12/22/1972  . TETANUS/TDAP  04/22/2016  . MAMMOGRAM  06/02/2018  . PAP SMEAR  09/20/2019  . COLONOSCOPY  01/22/2020    The following portions of the patient's history were reviewed and updated as appropriate: allergies, current medications, past family history, past medical history, past social history, past surgical history and problem list.  Review of Systems Review of Systems  Constitutional: Negative for activity change, appetite change and fatigue.  HENT: Negative for hearing loss, congestion, tinnitus and ear discharge.  dentist q56m Eyes: Negative for visual disturbance (see optho q1y -- vision corrected to 20/20 with glasses).  Respiratory: Negative for cough, chest tightness and shortness of breath.   Cardiovascular: Negative for chest pain, palpitations and leg swelling.  Gastrointestinal: Negative for abdominal pain, diarrhea, constipation and abdominal distention.  Genitourinary: Negative for urgency, frequency, decreased urine volume  and difficulty urinating.  Musculoskeletal: Negative for back pain, arthralgias and gait problem.  Skin: Negative for color change, pallor and rash.  Neurological: Negative for dizziness, light-headedness, numbness and headaches.  Hematological: Negative for adenopathy. Does not bruise/bleed easily.  Psychiatric/Behavioral: Negative for suicidal ideas, confusion, sleep disturbance, self-injury, dysphoric mood, decreased concentration and agitation.       Objective:    BP 108/70 (BP Location: Left Arm, Patient Position: Sitting, Cuff Size: Small)   Pulse 70   Temp 98 F (36.7 C) (Oral)   Resp 14   Ht 5\' 7"  (1.702 m)   Wt 116 lb 6 oz (52.8 kg)   SpO2 96%   BMI 18.23 kg/m  General appearance: alert, cooperative, appears stated age and no distress Head: Normocephalic, without obvious abnormality, atraumatic Eyes: conjunctivae/corneas clear. PERRL, EOM's intact. Fundi benign. Ears: normal TM's and external ear canals both ears Nose: Nares normal. Septum midline. Mucosa normal. No drainage or sinus tenderness. Throat: lips, mucosa, and tongue normal; teeth and gums normal Neck: no adenopathy, no carotid bruit, no JVD, supple, symmetrical, trachea midline and thyroid not enlarged, symmetric, no tenderness/mass/nodules Back: symmetric, no curvature. ROM normal. No CVA tenderness. Lungs: clear to auscultation bilaterally Breasts: gyn Heart: regular rate and rhythm, S1, S2 normal, no murmur, click, rub or gallop Abdomen: soft, non-tender; bowel sounds normal; no masses,  no organomegaly Pelvic: deferred -gyn Extremities: extremities normal, atraumatic, no cyanosis or edema Pulses: 2+ and  symmetric Skin: Skin color, texture, turgor normal. No rashes or lesions Lymph nodes: Cervical, supraclavicular, and axillary nodes normal. Neurologic: Alert and oriented X 3, normal strength and tone. Normal symmetric reflexes. Normal coordination and gait    Assessment:    Healthy female exam.       Plan:    ghm utd Check labs  See After Visit Summary for Counseling Recommendations    1. Vitamin D deficiency   - Vitamin D (25 hydroxy)  2. Hypothyroidism, unspecified type   - TSH  3. Preventative health care See above - Lipid panel - CBC with Differential/Platelet - TSH - Comprehensive metabolic panel - POCT Urinalysis Dipstick (Automated) - Vitamin D (25 hydroxy)  4. Chronic left-sided low back pain without sciatica   - DG Lumbar Spine Complete; Future  5. Need for hepatitis C screening test   - Hepatitis C antibody

## 2016-10-18 NOTE — Progress Notes (Signed)
Pre visit review using our clinic review tool, if applicable. No additional management support is needed unless otherwise documented below in the visit note. 

## 2016-10-18 NOTE — Patient Instructions (Signed)
Preventive Care 40-64 Years, Female Preventive care refers to lifestyle choices and visits with your health care provider that can promote health and wellness. What does preventive care include?  A yearly physical exam. This is also called an annual well check.  Dental exams once or twice a year.  Routine eye exams. Ask your health care provider how often you should have your eyes checked.  Personal lifestyle choices, including: ? Daily care of your teeth and gums. ? Regular physical activity. ? Eating a healthy diet. ? Avoiding tobacco and drug use. ? Limiting alcohol use. ? Practicing safe sex. ? Taking low-dose aspirin daily starting at age 58. ? Taking vitamin and mineral supplements as recommended by your health care provider. What happens during an annual well check? The services and screenings done by your health care provider during your annual well check will depend on your age, overall health, lifestyle risk factors, and family history of disease. Counseling Your health care provider may ask you questions about your:  Alcohol use.  Tobacco use.  Drug use.  Emotional well-being.  Home and relationship well-being.  Sexual activity.  Eating habits.  Work and work Statistician.  Method of birth control.  Menstrual cycle.  Pregnancy history.  Screening You may have the following tests or measurements:  Height, weight, and BMI.  Blood pressure.  Lipid and cholesterol levels. These may be checked every 5 years, or more frequently if you are over 81 years old.  Skin check.  Lung cancer screening. You may have this screening every year starting at age 78 if you have a 30-pack-year history of smoking and currently smoke or have quit within the past 15 years.  Fecal occult blood test (FOBT) of the stool. You may have this test every year starting at age 65.  Flexible sigmoidoscopy or colonoscopy. You may have a sigmoidoscopy every 5 years or a colonoscopy  every 10 years starting at age 30.  Hepatitis C blood test.  Hepatitis B blood test.  Sexually transmitted disease (STD) testing.  Diabetes screening. This is done by checking your blood sugar (glucose) after you have not eaten for a while (fasting). You may have this done every 1-3 years.  Mammogram. This may be done every 1-2 years. Talk to your health care provider about when you should start having regular mammograms. This may depend on whether you have a family history of breast cancer.  BRCA-related cancer screening. This may be done if you have a family history of breast, ovarian, tubal, or peritoneal cancers.  Pelvic exam and Pap test. This may be done every 3 years starting at age 80. Starting at age 36, this may be done every 5 years if you have a Pap test in combination with an HPV test.  Bone density scan. This is done to screen for osteoporosis. You may have this scan if you are at high risk for osteoporosis.  Discuss your test results, treatment options, and if necessary, the need for more tests with your health care provider. Vaccines Your health care provider may recommend certain vaccines, such as:  Influenza vaccine. This is recommended every year.  Tetanus, diphtheria, and acellular pertussis (Tdap, Td) vaccine. You may need a Td booster every 10 years.  Varicella vaccine. You may need this if you have not been vaccinated.  Zoster vaccine. You may need this after age 5.  Measles, mumps, and rubella (MMR) vaccine. You may need at least one dose of MMR if you were born in  1955 or later. You may also need a second dose.  Pneumococcal 13-valent conjugate (PCV13) vaccine. You may need this if you have certain conditions and were not previously vaccinated.  Pneumococcal polysaccharide (PPSV23) vaccine. You may need one or two doses if you smoke cigarettes or if you have certain conditions.  Meningococcal vaccine. You may need this if you have certain  conditions.  Hepatitis A vaccine. You may need this if you have certain conditions or if you travel or work in places where you may be exposed to hepatitis A.  Hepatitis B vaccine. You may need this if you have certain conditions or if you travel or work in places where you may be exposed to hepatitis B.  Haemophilus influenzae type b (Hib) vaccine. You may need this if you have certain conditions.  Talk to your health care provider about which screenings and vaccines you need and how often you need them. This information is not intended to replace advice given to you by your health care provider. Make sure you discuss any questions you have with your health care provider. Document Released: 03/06/2015 Document Revised: 10/28/2015 Document Reviewed: 12/09/2014 Elsevier Interactive Patient Education  2017 [employer].

## 2016-10-19 ENCOUNTER — Telehealth: Payer: Self-pay | Admitting: Family Medicine

## 2016-10-19 LAB — LIPID PANEL
CHOL/HDL RATIO: 3
Cholesterol: 180 mg/dL (ref 0–200)
HDL: 68.2 mg/dL (ref >39.00)
LDL Cholesterol: 83 mg/dL (ref 0–99)
NONHDL: 111.59
Triglycerides: 142 mg/dL (ref 0.0–149.0)
VLDL: 28.4 mg/dL (ref 0.0–40.0)

## 2016-10-19 LAB — COMPREHENSIVE METABOLIC PANEL
ALK PHOS: 40 U/L (ref 39–117)
ALT: 18 U/L (ref 0–35)
AST: 25 U/L (ref 0–37)
Albumin: 4.3 g/dL (ref 3.5–5.2)
BILIRUBIN TOTAL: 0.5 mg/dL (ref 0.2–1.2)
BUN: 19 mg/dL (ref 6–23)
CALCIUM: 9.6 mg/dL (ref 8.4–10.5)
CO2: 31 meq/L (ref 19–32)
CREATININE: 0.6 mg/dL (ref 0.40–1.20)
Chloride: 102 mEq/L (ref 96–112)
GFR: 108.82 mL/min (ref >60.00)
GLUCOSE: 82 mg/dL (ref 70–99)
Potassium: 4.4 mEq/L (ref 3.5–5.1)
Sodium: 138 mEq/L (ref 135–145)
TOTAL PROTEIN: 6.8 g/dL (ref 6.0–8.3)

## 2016-10-19 LAB — CBC WITH DIFFERENTIAL/PLATELET
BASOS ABS: 0.1 10*3/uL (ref 0.0–0.1)
Basophils Relative: 0.8 % (ref 0.0–3.0)
Eosinophils Absolute: 0.3 10*3/uL (ref 0.0–0.7)
Eosinophils Relative: 4 % (ref 0.0–5.0)
HCT: 38.8 % (ref 36.0–46.0)
HEMOGLOBIN: 13 g/dL (ref 12.0–15.0)
LYMPHS PCT: 22.9 % (ref 12.0–46.0)
Lymphs Abs: 1.5 10*3/uL (ref 0.7–4.0)
MCHC: 33.6 g/dL (ref 30.0–36.0)
MCV: 97.2 fl (ref 78.0–100.0)
MONOS PCT: 6.9 % (ref 3.0–12.0)
Monocytes Absolute: 0.4 10*3/uL (ref 0.1–1.0)
Neutro Abs: 4.1 10*3/uL (ref 1.4–7.7)
Neutrophils Relative %: 65.4 % (ref 43.0–77.0)
Platelets: 194 10*3/uL (ref 150.0–400.0)
RBC: 3.99 Mil/uL (ref 3.87–5.11)
RDW: 12.3 % (ref 11.5–15.5)
WBC: 6.3 10*3/uL (ref 4.0–10.5)

## 2016-10-19 LAB — HEPATITIS C ANTIBODY: HCV Ab: NONREACTIVE

## 2016-10-19 LAB — VITAMIN D 25 HYDROXY (VIT D DEFICIENCY, FRACTURES): VITD: 46.71 ng/mL (ref 30.00–100.00)

## 2016-10-19 LAB — TSH: TSH: 3.01 u[IU]/mL (ref 0.35–4.50)

## 2016-10-19 NOTE — Telephone Encounter (Signed)
Pt returning call for lab results  

## 2016-10-19 NOTE — Telephone Encounter (Signed)
Patient aware of lab results/She is wanting to be able to see the rest of the labs on MyChart/thx dmf

## 2016-10-20 ENCOUNTER — Encounter: Payer: Self-pay | Admitting: Family Medicine

## 2016-10-26 ENCOUNTER — Ambulatory Visit (INDEPENDENT_AMBULATORY_CARE_PROVIDER_SITE_OTHER)
Admission: RE | Admit: 2016-10-26 | Discharge: 2016-10-26 | Disposition: A | Discharge status: Home / Self Care | Payer: BLUE CROSS/BLUE SHIELD | Source: Ambulatory Visit | Attending: Family Medicine | Admitting: Family Medicine

## 2016-10-26 DIAGNOSIS — M545 Low back pain: Secondary | ICD-10-CM | POA: Diagnosis not present

## 2016-10-26 DIAGNOSIS — G8929 Other chronic pain: Secondary | ICD-10-CM

## 2016-10-30 DIAGNOSIS — H33001 Unspecified retinal detachment with retinal break, right eye: Secondary | ICD-10-CM | POA: Diagnosis not present

## 2016-10-31 DIAGNOSIS — H33321 Round hole, right eye: Secondary | ICD-10-CM | POA: Diagnosis not present

## 2016-10-31 DIAGNOSIS — H33011 Retinal detachment with single break, right eye: Secondary | ICD-10-CM | POA: Diagnosis not present

## 2016-10-31 DIAGNOSIS — H43813 Vitreous degeneration, bilateral: Secondary | ICD-10-CM | POA: Diagnosis not present

## 2016-10-31 DIAGNOSIS — H33311 Horseshoe tear of retina without detachment, right eye: Secondary | ICD-10-CM | POA: Diagnosis not present

## 2016-11-21 ENCOUNTER — Other Ambulatory Visit: Payer: Self-pay | Admitting: Family Medicine

## 2016-11-21 DIAGNOSIS — E038 Other specified hypothyroidism: Secondary | ICD-10-CM

## 2017-01-10 ENCOUNTER — Ambulatory Visit (INDEPENDENT_AMBULATORY_CARE_PROVIDER_SITE_OTHER): Payer: BLUE CROSS/BLUE SHIELD | Admitting: Family Medicine

## 2017-01-10 ENCOUNTER — Other Ambulatory Visit: Payer: Self-pay | Admitting: Family Medicine

## 2017-01-10 ENCOUNTER — Encounter: Payer: Self-pay | Admitting: Family Medicine

## 2017-01-10 ENCOUNTER — Ambulatory Visit (HOSPITAL_BASED_OUTPATIENT_CLINIC_OR_DEPARTMENT_OTHER)
Admission: RE | Admit: 2017-01-10 | Discharge: 2017-01-10 | Disposition: A | Discharge status: Home / Self Care | Payer: BLUE CROSS/BLUE SHIELD | Source: Ambulatory Visit | Attending: Family Medicine | Admitting: Family Medicine

## 2017-01-10 VITALS — BP 98/60 | HR 87 | Temp 101.7°F | Resp 16 | Ht 67.0 in | Wt 118.6 lb

## 2017-01-10 DIAGNOSIS — J111 Influenza due to unidentified influenza virus with other respiratory manifestations: Secondary | ICD-10-CM | POA: Diagnosis not present

## 2017-01-10 DIAGNOSIS — R509 Fever, unspecified: Secondary | ICD-10-CM | POA: Diagnosis not present

## 2017-01-10 DIAGNOSIS — R05 Cough: Secondary | ICD-10-CM | POA: Insufficient documentation

## 2017-01-10 DIAGNOSIS — R918 Other nonspecific abnormal finding of lung field: Secondary | ICD-10-CM | POA: Diagnosis not present

## 2017-01-10 LAB — CBC WITH DIFFERENTIAL/PLATELET
BASOS ABS: 0 10*3/uL (ref 0.0–0.1)
Basophils Relative: 0.3 % (ref 0.0–3.0)
EOS PCT: 0.3 % (ref 0.0–5.0)
Eosinophils Absolute: 0 10*3/uL (ref 0.0–0.7)
HEMATOCRIT: 31.7 % — AB (ref 36.0–46.0)
HEMOGLOBIN: 11 g/dL — AB (ref 12.0–15.0)
Lymphocytes Relative: 20.8 % (ref 12.0–46.0)
Lymphs Abs: 0.9 10*3/uL (ref 0.7–4.0)
MCHC: 34.7 g/dL (ref 30.0–36.0)
MCV: 95.9 fl (ref 78.0–100.0)
MONO ABS: 0.5 10*3/uL (ref 0.1–1.0)
Monocytes Relative: 10.1 % (ref 3.0–12.0)
NEUTROS ABS: 3.1 10*3/uL (ref 1.4–7.7)
Neutrophils Relative %: 68.5 % (ref 43.0–77.0)
Platelets: 143 10*3/uL — ABNORMAL LOW (ref 150.0–400.0)
RBC: 3.3 Mil/uL — ABNORMAL LOW (ref 3.87–5.11)
RDW: 12.4 % (ref 11.5–15.5)
WBC: 4.6 10*3/uL (ref 4.0–10.5)

## 2017-01-10 LAB — POCT INFLUENZA A/B
INFLUENZA A, POC: NEGATIVE
INFLUENZA B, POC: NEGATIVE

## 2017-01-10 MED ORDER — OSELTAMIVIR PHOSPHATE 75 MG PO CAPS: 75.0000 mg | ORAL_CAPSULE | Freq: Two times a day (BID) | ORAL | 0 refills | Status: DC

## 2017-01-10 MED ORDER — LEVOFLOXACIN 500 MG PO TABS: 500.0000 mg | ORAL_TABLET | Freq: Every day | ORAL | 0 refills | Status: DC

## 2017-01-10 NOTE — Patient Instructions (Signed)

## 2017-01-10 NOTE — Progress Notes (Signed)
Patient ID: Kathryn Mcdaniel, female   DOB: 1955-07-20, 61 y.o.   MRN: [id number]     Subjective:  I acted as a Education administrator for Dr. Carollee Herter.  Guerry Bruin, [place]   Patient ID: Kathryn Mcdaniel, female    DOB: Dec 25, 1955, 61 y.o.   MRN: [id number]  Chief Complaint  Patient presents with  . Fever  . Chills  . bodyaches    HPI  Patient is in today for fever, bodyaches, and chills.  Symptoms started Friday.  She has taken ibuprofen for fever.  No congestion.    Patient Care Team: Carollee Herter, Alferd Apa, DO as PCP - General (Family Medicine) Brien Few, MD as Consulting Physician (Obstetrics and Gynecology) Annice Pih, [place], OD as Consulting Physician (Optometry)   Past Medical History:  Diagnosis Date  . Abnormal breast biopsy   . Basal cell carcinoma of skin    left temple  . Breast cancer ([place])   . Depression   . 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 Personal history of cervical dysplasia   . Thyroid disease   . Unspecified personal history presenting hazards to health    Radiation Therapy    Past Surgical History:  Procedure Laterality Date  . BREAST BIOPSY    . BREAST SURGERY     right mastectomy  . MASTECTOMY Right   . MINOR EXCISION OF MASS CHEST WALL Right 08/09/2013   Performed by Fanny Skates, MD at [facility]  . NASAL SINUS SURGERY     sinus stripping    Family History  Problem Relation Age of Onset  . Breast cancer Mother   . Squamous cell carcinoma Mother        sinus  . Transient ischemic attack Mother   . Cancer Father        prostate  . Hypertension Father   . Diabetes Father   . Cancer Brother   . Cancer Other        grandmother, breast cancer  . Breast cancer Paternal Grandmother   . Stroke Maternal Grandmother   . Colon cancer Neg Hx   . Coronary artery disease Neg Hx     Social History     Socioeconomic History  . Marital status: Single    Spouse name: Not on file  . Number of children: Not on file  . Years of education: Not on file  . Highest education level: Not on file  Social Needs  . Financial resource strain: Not on file  . Food insecurity - worry: Not on file  . Food insecurity - inability: Not on file  . Transportation needs - medical: Not on file  . Transportation needs - non-medical: Not on file  Occupational History  . Occupation: self employed  Tobacco Use  . Smoking status: Former Smoker    Last attempt to quit: 09/15/1984    Years since quitting: 32.3  . Smokeless tobacco: Never Used  Substance and Sexual Activity  . Alcohol use: Yes    Alcohol/week: 8.4 oz    Types: 14 Cans of beer per week    Comment: daily  . Drug use: No  . Sexual activity: Not on file  Other Topics Concern  . Not on file  Social History Narrative   Single   Work; Therapist, occupational, has a dog walking business   Exercise-- swim, walking dogs and bike  Outpatient Medications Prior to Visit  Medication Sig Dispense Refill  . ALPRAZolam (XANAX) 0.5 MG tablet Take 1 tablet (0.5 mg total) by mouth at bedtime as needed. 30 tablet 0  . Ascorbic Acid (VITAMIN C) 1000 MG tablet Take 1,000 mg by mouth daily.    . Calcium Carbonate-Vitamin D (CALTRATE 600+D) 600-400 MG-UNIT per tablet Take 1 tablet by mouth 2 (two) times daily.    . cholecalciferol (VITAMIN D) 1000 UNITS tablet Take 1,000 Units by mouth daily.    Marland Kitchen FLUoxetine (PROZAC) 20 MG capsule Take 3 capsules (60 mg total) by mouth daily. 90 capsule 5  . levothyroxine (SYNTHROID, LEVOTHROID) 88 MCG tablet TAKE 1 TABLET (88 MCG TOTAL) BY MOUTH DAILY. 90 tablet 1  . Multiple Vitamin (MULTIVITAMIN) tablet Take 1 tablet by mouth daily.    . vitamin E 400 UNIT capsule Take 400 Units by mouth daily.    Marland Kitchen levothyroxine (SYNTHROID, LEVOTHROID) 88 MCG tablet TAKE 1 TABLET (88 MCG TOTAL) BY MOUTH DAILY. 90 tablet 1   No  facility-administered medications prior to visit.     Allergies  Allergen Reactions  . Compazine [Prochlorperazine Edisylate]     "bizarre, dystonic"  . Metoclopramide Hcl     Review of Systems  Constitutional: Positive for chills, fever and malaise/fatigue.  HENT: Negative for congestion and sore throat.   Eyes: Negative for blurred vision.  Respiratory: Positive for cough (dry). Negative for shortness of breath.   Cardiovascular: Negative for chest pain, palpitations and leg swelling.  Gastrointestinal: Negative for vomiting.  Musculoskeletal: Negative for back pain.  Skin: Negative for rash.  Neurological: Negative for loss of consciousness and headaches.       Objective:    Physical Exam  BP 98/60 (BP Location: Left Arm, Cuff Size: Normal)   Pulse 87   Temp (!) 101.7 F (38.7 C) (Oral)   Resp 16   Ht 5\' 7"  (1.702 m)   Wt 118 lb 9.6 oz (53.8 kg)   SpO2 96%   BMI 18.58 kg/m  Wt Readings from Last 3 Encounters:  01/10/17 118 lb 9.6 oz (53.8 kg)  10/18/16 116 lb 6 oz (52.8 kg)  11/09/15 115 lb (52.2 kg)   BP Readings from Last 3 Encounters:  01/10/17 98/60  10/18/16 108/70  11/09/15 113/78     Immunization History  Administered Date(s) Administered  . Td 04/23/2006    Health Maintenance  Topic Date Due  . HIV Screening  12/22/1972  . TETANUS/TDAP  04/22/2016  . MAMMOGRAM  06/02/2018  . PAP SMEAR  09/20/2019  . COLONOSCOPY  01/22/2020  . Hepatitis C Screening  Completed    Lab Results  Component Value Date   WBC 6.3 10/18/2016   HGB 13.0 10/18/2016   HCT 38.8 10/18/2016   PLT 194.0 10/18/2016   GLUCOSE 82 10/18/2016   CHOL 180 10/18/2016   TRIG 142.0 10/18/2016   HDL 68.20 10/18/2016   LDLCALC 83 10/18/2016   ALT 18 10/18/2016   AST 25 10/18/2016   NA 138 10/18/2016   K 4.4 10/18/2016   CL 102 10/18/2016   CREATININE 0.60 10/18/2016   BUN 19 10/18/2016   CO2 31 10/18/2016   TSH 3.01 10/18/2016   INR 1.2 01/02/2006    Lab Results    Component Value Date   TSH 3.01 10/18/2016   Lab Results  Component Value Date   WBC 6.3 10/18/2016   HGB 13.0 10/18/2016   HCT 38.8 10/18/2016   MCV 97.2 10/18/2016  PLT 194.0 10/18/2016   Lab Results  Component Value Date   NA 138 10/18/2016   K 4.4 10/18/2016   CO2 31 10/18/2016   GLUCOSE 82 10/18/2016   BUN 19 10/18/2016   CREATININE 0.60 10/18/2016   BILITOT 0.5 10/18/2016   ALKPHOS 40 10/18/2016   AST 25 10/18/2016   ALT 18 10/18/2016   PROT 6.8 10/18/2016   ALBUMIN 4.3 10/18/2016   CALCIUM 9.6 10/18/2016   ANIONGAP 6 03/08/2014   GFR 108.82 10/18/2016   Lab Results  Component Value Date   CHOL 180 10/18/2016   Lab Results  Component Value Date   HDL 68.20 10/18/2016   Lab Results  Component Value Date   LDLCALC 83 10/18/2016   Lab Results  Component Value Date   TRIG 142.0 10/18/2016   Lab Results  Component Value Date   CHOLHDL 3 10/18/2016   No results found for: HGBA1C       Assessment & Plan:   Problem List Items Addressed This Visit    None    Visit Diagnoses    Fever and chills    -  Primary   Relevant Orders   POCT Influenza A/B (Completed)   DG Chest 2 View   CBC with Differential/Platelet   Influenza       Relevant Medications   oseltamivir (TAMIFLU) 75 MG capsule   Other Relevant Orders   DG Chest 2 View   CBC with Differential/Platelet      I am having [name] start on oseltamivir. I am also having her maintain her multivitamin, vitamin C, vitamin E, Calcium Carbonate-Vitamin D, ALPRAZolam, FLUoxetine, cholecalciferol, and levothyroxine.  Meds ordered this encounter  Medications  . oseltamivir (TAMIFLU) 75 MG capsule    Sig: Take 1 capsule (75 mg total) by mouth 2 (two) times daily.    Dispense:  10 capsule    Refill:  0    CMA served as scribe during this visit. History, Physical and Plan performed by medical provider. Documentation and orders reviewed and attested to.  [name], DO

## 2017-01-10 NOTE — Assessment & Plan Note (Signed)
Rest Drink plenty of fluids  rto prn

## 2017-01-11 ENCOUNTER — Encounter: Payer: Self-pay | Admitting: Family Medicine

## 2017-01-16 NOTE — Telephone Encounter (Signed)
Yes--  otc iron 1 a day

## 2017-01-17 ENCOUNTER — Other Ambulatory Visit: Payer: Self-pay

## 2017-01-17 DIAGNOSIS — R799 Abnormal finding of blood chemistry, unspecified: Secondary | ICD-10-CM

## 2017-01-30 ENCOUNTER — Ambulatory Visit: Payer: BLUE CROSS/BLUE SHIELD | Admitting: Family Medicine

## 2017-02-10 ENCOUNTER — Ambulatory Visit (HOSPITAL_BASED_OUTPATIENT_CLINIC_OR_DEPARTMENT_OTHER)
Admission: RE | Admit: 2017-02-10 | Discharge: 2017-02-10 | Disposition: A | Discharge status: Home / Self Care | Payer: BLUE CROSS/BLUE SHIELD | Source: Ambulatory Visit | Attending: Family Medicine | Admitting: Family Medicine

## 2017-02-10 ENCOUNTER — Encounter: Payer: Self-pay | Admitting: Family Medicine

## 2017-02-10 ENCOUNTER — Ambulatory Visit (INDEPENDENT_AMBULATORY_CARE_PROVIDER_SITE_OTHER): Payer: BLUE CROSS/BLUE SHIELD | Admitting: Family Medicine

## 2017-02-10 VITALS — BP 100/60 | HR 71 | Temp 97.9°F | Resp 16 | Ht 67.0 in | Wt 116.2 lb

## 2017-02-10 DIAGNOSIS — J189 Pneumonia, unspecified organism: Secondary | ICD-10-CM

## 2017-02-10 DIAGNOSIS — D509 Iron deficiency anemia, unspecified: Secondary | ICD-10-CM

## 2017-02-10 DIAGNOSIS — J181 Lobar pneumonia, unspecified organism: Secondary | ICD-10-CM | POA: Diagnosis not present

## 2017-02-10 LAB — CBC WITH DIFFERENTIAL/PLATELET
BASOS ABS: 0 10*3/uL (ref 0.0–0.1)
BASOS PCT: 0.7 % (ref 0.0–3.0)
EOS ABS: 0.3 10*3/uL (ref 0.0–0.7)
Eosinophils Relative: 5.6 % — ABNORMAL HIGH (ref 0.0–5.0)
HEMATOCRIT: 38.2 % (ref 36.0–46.0)
Hemoglobin: 12.5 g/dL (ref 12.0–15.0)
LYMPHS ABS: 1.2 10*3/uL (ref 0.7–4.0)
LYMPHS PCT: 24.6 % (ref 12.0–46.0)
MCHC: 32.8 g/dL (ref 30.0–36.0)
MCV: 97.1 fl (ref 78.0–100.0)
MONO ABS: 0.4 10*3/uL (ref 0.1–1.0)
Monocytes Relative: 7.4 % (ref 3.0–12.0)
NEUTROS ABS: 3 10*3/uL (ref 1.4–7.7)
NEUTROS PCT: 61.7 % (ref 43.0–77.0)
PLATELETS: 201 10*3/uL (ref 150.0–400.0)
RBC: 3.93 Mil/uL (ref 3.87–5.11)
RDW: 13.2 % (ref 11.5–15.5)
WBC: 4.8 10*3/uL (ref 4.0–10.5)

## 2017-02-10 NOTE — Patient Instructions (Signed)

## 2017-02-10 NOTE — Progress Notes (Signed)
Patient ID: Kathryn Mcdaniel, female   DOB: Aug 31, 1955, 61 y.o.   MRN: [id number]     Subjective:  I acted as a Education administrator for Dr. Carollee Herter.  Guerry Bruin, [place]   Patient ID: Kathryn Mcdaniel, female    DOB: 24-Sep-1955, 61 y.o.   MRN: [id number]  Chief Complaint  Patient presents with  . Follow-up    pneumonia needs chest xray    HPI  Patient is in today for follow up pneumonia.  Patient states she is doing much better.  No cough, fevers  We will recheck cbcd as well  Patient Care Team: Carollee Herter, Alferd Apa, DO as PCP - General (Family Medicine) Brien Few, MD as Consulting Physician (Obstetrics and Gynecology) Annice Pih, [place], OD as Consulting Physician (Optometry)   Past Medical History:  Diagnosis Date  . Abnormal breast biopsy   . Basal cell carcinoma of skin    left temple  . Breast cancer ([place])   . Depression   . 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 Personal history of cervical dysplasia   . Thyroid disease   . Unspecified personal history presenting hazards to health    Radiation Therapy    Past Surgical History:  Procedure Laterality Date  . BREAST BIOPSY    . BREAST SURGERY     right mastectomy  . MASS EXCISION Right 08/09/2013   Procedure: MINOR EXCISION OF MASS CHEST WALL;  Surgeon: [name], MD;  Location: [facility];  Service: General;  Laterality: Right;  . MASTECTOMY Right   . NASAL SINUS SURGERY     sinus stripping    Family History  Problem Relation Age of Onset  . Breast cancer Mother   . Squamous cell carcinoma Mother        sinus  . Transient ischemic attack Mother   . Cancer Father        prostate  . Hypertension Father   . Diabetes Father   . Cancer Brother   . Cancer Other        grandmother, breast cancer  . Breast cancer Paternal Grandmother   . Stroke Maternal  Grandmother   . Colon cancer Neg Hx   . Coronary artery disease Neg Hx     Social History   Socioeconomic History  . Marital status: Single    Spouse name: Not on file  . Number of children: Not on file  . Years of education: Not on file  . Highest education level: Not on file  Social Needs  . Financial resource strain: Not on file  . Food insecurity - worry: Not on file  . Food insecurity - inability: Not on file  . Transportation needs - medical: Not on file  . Transportation needs - non-medical: Not on file  Occupational History  . Occupation: self employed  Tobacco Use  . Smoking status: Former Smoker    Last attempt to quit: 09/15/1984    Years since quitting: 32.4  . Smokeless tobacco: Never Used  Substance and Sexual Activity  . Alcohol use: Yes    Alcohol/week: 8.4 oz    Types: 14 Cans of beer per week    Comment: daily  . Drug use: No  . Sexual activity: Not on file  Other Topics Concern  . Not on file  Social History Narrative   Single   Work; Therapist, occupational,  has a dog walking business   Exercise-- swim, walking dogs and bike    Outpatient Medications Prior to Visit  Medication Sig Dispense Refill  . ALPRAZolam (XANAX) 0.5 MG tablet Take 1 tablet (0.5 mg total) by mouth at bedtime as needed. 30 tablet 0  . Ascorbic Acid (VITAMIN C) 1000 MG tablet Take 1,000 mg by mouth daily.    . Calcium Carbonate-Vitamin D (CALTRATE 600+D) 600-400 MG-UNIT per tablet Take 1 tablet by mouth 2 (two) times daily.    . cholecalciferol (VITAMIN D) 1000 UNITS tablet Take 1,000 Units by mouth daily.    Marland Kitchen FLUoxetine (PROZAC) 20 MG capsule Take 3 capsules (60 mg total) by mouth daily. 90 capsule 5  . levothyroxine (SYNTHROID, LEVOTHROID) 88 MCG tablet TAKE 1 TABLET (88 MCG TOTAL) BY MOUTH DAILY. 90 tablet 1  . Multiple Vitamin (MULTIVITAMIN) tablet Take 1 tablet by mouth daily.    . vitamin E 400 UNIT capsule Take 400 Units by mouth daily.    Marland Kitchen levofloxacin (LEVAQUIN) 500 MG  tablet Take 1 tablet (500 mg total) by mouth daily. Take for 10 days 10 tablet 0  . oseltamivir (TAMIFLU) 75 MG capsule Take 1 capsule (75 mg total) by mouth 2 (two) times daily. 10 capsule 0   No facility-administered medications prior to visit.     Allergies  Allergen Reactions  . Compazine [Prochlorperazine Edisylate]     "bizarre, dystonic"  . Metoclopramide Hcl     Review of Systems  Constitutional: Negative for fever and malaise/fatigue.  HENT: Negative for congestion.   Eyes: Negative for blurred vision.  Respiratory: Negative for cough and shortness of breath.   Cardiovascular: Negative for chest pain, palpitations and leg swelling.  Gastrointestinal: Negative for vomiting.  Musculoskeletal: Negative for back pain.  Skin: Negative for rash.  Neurological: Negative for loss of consciousness and headaches.       Objective:    Physical Exam  Constitutional: She is oriented to person, place, and time. She appears well-developed and well-nourished.  HENT:  Head: Normocephalic and atraumatic.  Eyes: Conjunctivae and EOM are normal.  Neck: Normal range of motion. Neck supple. No JVD present. Carotid bruit is not present. No thyromegaly present.  Cardiovascular: Normal rate, regular rhythm and normal heart sounds.  No murmur heard. Pulmonary/Chest: Effort normal and breath sounds normal. No respiratory distress. She has no wheezes. She has no rales. She exhibits no tenderness.  Musculoskeletal: She exhibits no edema.  Neurological: She is alert and oriented to person, place, and time.  Psychiatric: She has a normal mood and affect.  Nursing note and vitals reviewed.   BP 100/60 (BP Location: Left Arm, Cuff Size: Normal)   Pulse 71   Temp 97.9 F (36.6 C) (Oral)   Resp 16   Ht 5\' 7"  (1.702 m)   Wt 116 lb 3.2 oz (52.7 kg)   SpO2 98%   BMI 18.20 kg/m  Wt Readings from Last 3 Encounters:  02/10/17 116 lb 3.2 oz (52.7 kg)  01/10/17 118 lb 9.6 oz (53.8 kg)  10/18/16  116 lb 6 oz (52.8 kg)   BP Readings from Last 3 Encounters:  02/10/17 100/60  01/10/17 98/60  10/18/16 108/70     Immunization History  Administered Date(s) Administered  . Td 04/23/2006    Health Maintenance  Topic Date Due  . HIV Screening  12/22/1972  . TETANUS/TDAP  04/22/2016  . MAMMOGRAM  06/02/2018  . PAP SMEAR  09/20/2019  . COLONOSCOPY  01/22/2020  .  Hepatitis C Screening  Completed    Lab Results  Component Value Date   WBC 4.6 01/10/2017   HGB 11.0 (L) 01/10/2017   HCT 31.7 (L) 01/10/2017   PLT 143.0 (L) 01/10/2017   GLUCOSE 82 10/18/2016   CHOL 180 10/18/2016   TRIG 142.0 10/18/2016   HDL 68.20 10/18/2016   LDLCALC 83 10/18/2016   ALT 18 10/18/2016   AST 25 10/18/2016   NA 138 10/18/2016   K 4.4 10/18/2016   CL 102 10/18/2016   CREATININE 0.60 10/18/2016   BUN 19 10/18/2016   CO2 31 10/18/2016   TSH 3.01 10/18/2016   INR 1.2 01/02/2006    Lab Results  Component Value Date   TSH 3.01 10/18/2016   Lab Results  Component Value Date   WBC 4.6 01/10/2017   HGB 11.0 (L) 01/10/2017   HCT 31.7 (L) 01/10/2017   MCV 95.9 01/10/2017   PLT 143.0 (L) 01/10/2017   Lab Results  Component Value Date   NA 138 10/18/2016   K 4.4 10/18/2016   CO2 31 10/18/2016   GLUCOSE 82 10/18/2016   BUN 19 10/18/2016   CREATININE 0.60 10/18/2016   BILITOT 0.5 10/18/2016   ALKPHOS 40 10/18/2016   AST 25 10/18/2016   ALT 18 10/18/2016   PROT 6.8 10/18/2016   ALBUMIN 4.3 10/18/2016   CALCIUM 9.6 10/18/2016   ANIONGAP 6 03/08/2014   GFR 108.82 10/18/2016   Lab Results  Component Value Date   CHOL 180 10/18/2016   Lab Results  Component Value Date   HDL 68.20 10/18/2016   Lab Results  Component Value Date   LDLCALC 83 10/18/2016   Lab Results  Component Value Date   TRIG 142.0 10/18/2016   Lab Results  Component Value Date   CHOLHDL 3 10/18/2016   No results found for: HGBA1C       Assessment & Plan:   Problem List Items Addressed This Visit     None    Visit Diagnoses    Community acquired pneumonia, unspecified laterality    -  Primary   Relevant Orders   DG Chest 2 View   Iron deficiency anemia, unspecified iron deficiency anemia type       Relevant Orders   CBC with Differential/Platelet   Fecal occult blood, imunochemical      I have discontinued Mireya Eves's oseltamivir and levofloxacin. I am also having her maintain her multivitamin, vitamin C, vitamin E, Calcium Carbonate-Vitamin D, ALPRAZolam, FLUoxetine, cholecalciferol, and levothyroxine.  No orders of the defined types were placed in this encounter.   CMA served as Education administrator during this visit. History, Physical and Plan performed by medical provider. Documentation and orders reviewed and attested to.  [name], DO

## 2017-02-16 ENCOUNTER — Other Ambulatory Visit: Payer: BLUE CROSS/BLUE SHIELD

## 2017-02-16 ENCOUNTER — Ambulatory Visit: Payer: BLUE CROSS/BLUE SHIELD | Admitting: Hematology & Oncology

## 2017-02-24 ENCOUNTER — Other Ambulatory Visit (INDEPENDENT_AMBULATORY_CARE_PROVIDER_SITE_OTHER): Payer: BLUE CROSS/BLUE SHIELD

## 2017-02-24 DIAGNOSIS — D509 Iron deficiency anemia, unspecified: Secondary | ICD-10-CM

## 2017-02-24 LAB — FECAL OCCULT BLOOD, IMMUNOCHEMICAL: FECAL OCCULT BLD: NEGATIVE

## 2017-05-01 ENCOUNTER — Other Ambulatory Visit: Payer: Self-pay | Admitting: Family Medicine

## 2017-05-01 DIAGNOSIS — Z1231 Encounter for screening mammogram for malignant neoplasm of breast: Secondary | ICD-10-CM

## 2017-06-02 ENCOUNTER — Ambulatory Visit
Admission: RE | Admit: 2017-06-02 | Discharge: 2017-06-02 | Disposition: A | Discharge status: Home / Self Care | Payer: BLUE CROSS/BLUE SHIELD | Source: Ambulatory Visit | Attending: Family Medicine | Admitting: Family Medicine

## 2017-06-02 DIAGNOSIS — Z1231 Encounter for screening mammogram for malignant neoplasm of breast: Secondary | ICD-10-CM | POA: Diagnosis not present

## 2017-06-02 HISTORY — DX: Personal history of irradiation: Z92.3

## 2017-06-02 HISTORY — DX: Personal history of antineoplastic chemotherapy: Z92.21

## 2017-06-12 ENCOUNTER — Encounter: Payer: Self-pay | Admitting: Family Medicine

## 2017-06-12 ENCOUNTER — Other Ambulatory Visit: Payer: Self-pay | Admitting: Family Medicine

## 2017-06-12 DIAGNOSIS — R928 Other abnormal and inconclusive findings on diagnostic imaging of breast: Secondary | ICD-10-CM

## 2017-06-12 DIAGNOSIS — Z803 Family history of malignant neoplasm of breast: Secondary | ICD-10-CM

## 2017-06-12 NOTE — Telephone Encounter (Signed)
Can you please review her mammogram?

## 2017-06-12 NOTE — Progress Notes (Signed)
Mri br

## 2017-06-15 ENCOUNTER — Telehealth: Payer: Self-pay | Admitting: *Deleted

## 2017-06-15 DIAGNOSIS — R928 Other abnormal and inconclusive findings on diagnostic imaging of breast: Secondary | ICD-10-CM

## 2017-06-15 NOTE — Telephone Encounter (Signed)
Order placed    Copied from [place]. Topic: Referral - Request >> Jun 15, 2017  1:15 PM Lennox Solders wrote: Reason for CRM: toni from [facility] long radiologist is calling the breast mri needs to be with and without contrast

## 2017-06-21 ENCOUNTER — Ambulatory Visit (HOSPITAL_COMMUNITY)
Admission: RE | Admit: 2017-06-21 | Discharge: 2017-06-21 | Disposition: A | Discharge status: Home / Self Care | Payer: BLUE CROSS/BLUE SHIELD | Source: Ambulatory Visit | Attending: Family Medicine | Admitting: Family Medicine

## 2017-06-21 ENCOUNTER — Ambulatory Visit (HOSPITAL_COMMUNITY): Admission: RE | Admit: 2017-06-21 | Payer: BLUE CROSS/BLUE SHIELD | Source: Ambulatory Visit

## 2017-06-21 DIAGNOSIS — R928 Other abnormal and inconclusive findings on diagnostic imaging of breast: Secondary | ICD-10-CM | POA: Diagnosis not present

## 2017-06-21 DIAGNOSIS — Z9011 Acquired absence of right breast and nipple: Secondary | ICD-10-CM | POA: Diagnosis not present

## 2017-06-21 DIAGNOSIS — Z853 Personal history of malignant neoplasm of breast: Secondary | ICD-10-CM | POA: Diagnosis not present

## 2017-06-21 MED ORDER — GADOBENATE DIMEGLUMINE 529 MG/ML IV SOLN
10.0000 mL | Freq: Once | INTRAVENOUS | Status: AC | PRN
  Administered 2017-06-21: 10 mL via INTRAVENOUS

## 2017-06-22 ENCOUNTER — Other Ambulatory Visit: Payer: Self-pay | Admitting: Family Medicine

## 2017-06-22 DIAGNOSIS — N63 Unspecified lump in unspecified breast: Secondary | ICD-10-CM

## 2017-06-26 ENCOUNTER — Other Ambulatory Visit: Payer: Self-pay | Admitting: Family Medicine

## 2017-06-26 ENCOUNTER — Encounter: Payer: Self-pay | Admitting: Family Medicine

## 2017-06-26 DIAGNOSIS — N63 Unspecified lump in unspecified breast: Secondary | ICD-10-CM

## 2017-06-27 ENCOUNTER — Telehealth: Payer: Self-pay | Admitting: *Deleted

## 2017-06-27 NOTE — Telephone Encounter (Signed)
Copied from [place] [id number]. Topic: Inquiry >> Jun 26, 2017  4:41 PM Arletha Grippe wrote: Reason for CRM: pt would like to have call back with results of breast mri.  Please call [phone] Pt got a call to get a mri guided breast biopsy, so pt knows that something is going on. Also can call home number and leave message on machine

## 2017-06-27 NOTE — Telephone Encounter (Signed)
See my chart message

## 2017-06-27 NOTE — Telephone Encounter (Signed)
No MRI results have been commented on. Please advise. Patient also sent mychart message.

## 2017-07-05 ENCOUNTER — Ambulatory Visit
Admission: RE | Admit: 2017-07-05 | Discharge: 2017-07-05 | Disposition: A | Discharge status: Home / Self Care | Payer: BLUE CROSS/BLUE SHIELD | Source: Ambulatory Visit | Attending: Family Medicine | Admitting: Family Medicine

## 2017-07-05 DIAGNOSIS — N6012 Diffuse cystic mastopathy of left breast: Secondary | ICD-10-CM | POA: Diagnosis not present

## 2017-07-05 DIAGNOSIS — N63 Unspecified lump in unspecified breast: Secondary | ICD-10-CM

## 2017-07-05 DIAGNOSIS — N6489 Other specified disorders of breast: Secondary | ICD-10-CM | POA: Diagnosis not present

## 2017-07-05 MED ORDER — GADOBENATE DIMEGLUMINE 529 MG/ML IV SOLN
10.0000 mL | Freq: Once | INTRAVENOUS | Status: AC | PRN
  Administered 2017-07-05: 10 mL via INTRAVENOUS

## 2017-07-31 ENCOUNTER — Other Ambulatory Visit: Payer: Self-pay | Admitting: Family Medicine

## 2017-07-31 DIAGNOSIS — E038 Other specified hypothyroidism: Secondary | ICD-10-CM

## 2017-10-20 ENCOUNTER — Encounter: Payer: Self-pay | Admitting: Family Medicine

## 2017-10-20 ENCOUNTER — Ambulatory Visit (INDEPENDENT_AMBULATORY_CARE_PROVIDER_SITE_OTHER): Payer: BLUE CROSS/BLUE SHIELD | Admitting: Family Medicine

## 2017-10-20 VITALS — BP 103/62 | HR 77 | Temp 99.0°F | Resp 16 | Ht 67.0 in | Wt 116.2 lb

## 2017-10-20 DIAGNOSIS — J069 Acute upper respiratory infection, unspecified: Secondary | ICD-10-CM | POA: Diagnosis not present

## 2017-10-20 MED ORDER — FLUTICASONE PROPIONATE 50 MCG/ACT NA SUSP: 2.0000 | Freq: Every day | NASAL | 6 refills | Status: DC

## 2017-10-20 NOTE — Progress Notes (Signed)
Patient ID: Kathryn Mcdaniel, female   DOB: 04-Feb-1956, 62 y.o.   MRN: [id number]     Subjective:  I acted as a Education administrator for Dr. Carollee Herter.  Guerry Bruin, [place]   Patient ID: Kathryn Mcdaniel, female    DOB: 08/17/1955, 62 y.o.   MRN: [id number]  Chief Complaint  Patient presents with  . Nasal Congestion  . Cough  . Sore Throat    HPI  Patient is in today for nasal congestion, cough, and sore throat.  Low grade fever.  Drainage clear.  Taking benadryl which helped.     Patient Care Team: Carollee Herter, Alferd Apa, DO as PCP - General (Family Medicine) Brien Few, MD as Consulting Physician (Obstetrics and Gynecology) Annice Pih, [place], OD as Consulting Physician (Optometry)   Past Medical History:  Diagnosis Date  . Abnormal breast biopsy   . Basal cell carcinoma of skin    left temple  . Breast cancer ([place])   . Depression   . 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 Personal history of cervical dysplasia   . Personal history of chemotherapy   . Personal history of radiation therapy   . Thyroid disease   . Unspecified personal history presenting hazards to health    Radiation Therapy    Past Surgical History:  Procedure Laterality Date  . BREAST BIOPSY    . BREAST SURGERY     right mastectomy  . MASS EXCISION Right 08/09/2013   Procedure: MINOR EXCISION OF MASS CHEST WALL;  Surgeon: [name], MD;  Location: [facility];  Service: General;  Laterality: Right;  . MASTECTOMY Right   . NASAL SINUS SURGERY     sinus stripping    Family History  Problem Relation Age of Onset  . Breast cancer Mother   . Squamous cell carcinoma Mother        sinus  . Transient ischemic attack Mother   . Cancer Father        prostate  . Hypertension Father   . Diabetes Father   . Cancer Brother   . Cancer Other        grandmother,  breast cancer  . Breast cancer Paternal Grandmother   . Stroke Maternal Grandmother   . Colon cancer Neg Hx   . Coronary artery disease Neg Hx     Social History   Socioeconomic History  . Marital status: Single    Spouse name: Not on file  . Number of children: Not on file  . Years of education: Not on file  . Highest education level: Not on file  Occupational History  . Occupation: self employed  Social Needs  . Financial resource strain: Not on file  . Food insecurity:    Worry: Not on file    Inability: Not on file  . Transportation needs:    Medical: Not on file    Non-medical: Not on file  Tobacco Use  . Smoking status: Former Smoker    Last attempt to quit: 09/15/1984    Years since quitting: 33.1  . Smokeless tobacco: Never Used  Substance and Sexual Activity  . Alcohol use: Yes    Alcohol/week: 14.0 standard drinks    Types: 14 Cans of beer per week    Comment: daily  . Drug use: No  . Sexual activity: Not on file  Lifestyle  . Physical  activity:    Days per week: Not on file    Minutes per session: Not on file  . Stress: Not on file  Relationships  . Social connections:    Talks on phone: Not on file    Gets together: Not on file    Attends religious service: Not on file    Active member of club or organization: Not on file    Attends meetings of clubs or organizations: Not on file    Relationship status: Not on file  . Intimate partner violence:    Fear of current or ex partner: Not on file    Emotionally abused: Not on file    Physically abused: Not on file    Forced sexual activity: Not on file  Other Topics Concern  . Not on file  Social History Narrative   Single   Work; Therapist, occupational, has a dog walking business   Exercise-- swim, walking dogs and bike    Outpatient Medications Prior to Visit  Medication Sig Dispense Refill  . ALPRAZolam (XANAX) 0.5 MG tablet Take 1 tablet (0.5 mg total) by mouth at bedtime as needed. 30 tablet 0    . Ascorbic Acid (VITAMIN C) 1000 MG tablet Take 1,000 mg by mouth daily.    . Calcium Carbonate-Vitamin D (CALTRATE 600+D) 600-400 MG-UNIT per tablet Take 1 tablet by mouth 2 (two) times daily.    . cholecalciferol (VITAMIN D) 1000 UNITS tablet Take 1,000 Units by mouth daily.    Marland Kitchen FLUoxetine (PROZAC) 20 MG capsule Take 3 capsules (60 mg total) by mouth daily. 90 capsule 5  . levothyroxine (SYNTHROID, LEVOTHROID) 88 MCG tablet TAKE 1 TABLET (88 MCG TOTAL) BY MOUTH DAILY. 90 tablet 0  . Multiple Vitamin (MULTIVITAMIN) tablet Take 1 tablet by mouth daily.    . vitamin E 400 UNIT capsule Take 400 Units by mouth daily.     No facility-administered medications prior to visit.     Allergies  Allergen Reactions  . Compazine [Prochlorperazine Edisylate]     "bizarre, dystonic"  . Metoclopramide Hcl     Review of Systems  Constitutional: Negative for chills, fever and malaise/fatigue.  HENT: Positive for congestion (nasal), sinus pain and sore throat.   Eyes: Negative for blurred vision.  Respiratory: Positive for cough. Negative for shortness of breath.   Cardiovascular: Negative for chest pain, palpitations and leg swelling.  Gastrointestinal: Negative for vomiting.  Musculoskeletal: Negative for back pain.  Skin: Negative for rash.  Neurological: Negative for loss of consciousness and headaches.       Objective:    Physical Exam  Constitutional: She is oriented to person, place, and time. She appears well-developed and well-nourished.  HENT:  Right Ear: External ear normal.  Left Ear: External ear normal.  Nose: Mucosal edema and rhinorrhea present. No sinus tenderness.  + PND + errythema  Eyes: Conjunctivae are normal. Right eye exhibits no discharge. Left eye exhibits no discharge.  Cardiovascular: Normal rate, regular rhythm and normal heart sounds.  No murmur heard. Pulmonary/Chest: Effort normal and breath sounds normal. No respiratory distress. She has no wheezes. She  has no rales. She exhibits no tenderness.  Musculoskeletal: She exhibits no edema.  Lymphadenopathy:    She has cervical adenopathy.  Neurological: She is alert and oriented to person, place, and time.  Nursing note and vitals reviewed.   BP 103/62 (BP Location: Left Arm, Cuff Size: Normal)   Pulse 77   Temp 99 F (37.2 C) (Oral)  Resp 16   Ht 5\' 7"  (1.702 m)   Wt 116 lb 3.2 oz (52.7 kg)   SpO2 100%   BMI 18.20 kg/m  Wt Readings from Last 3 Encounters:  10/20/17 116 lb 3.2 oz (52.7 kg)  02/10/17 116 lb 3.2 oz (52.7 kg)  01/10/17 118 lb 9.6 oz (53.8 kg)   BP Readings from Last 3 Encounters:  10/20/17 103/62  02/10/17 100/60  01/10/17 98/60     Immunization History  Administered Date(s) Administered  . Td 04/23/2006    Health Maintenance  Topic Date Due  . HIV Screening  12/22/1972  . TETANUS/TDAP  04/22/2016  . MAMMOGRAM  06/03/2019  . PAP SMEAR  09/20/2019  . COLONOSCOPY  01/22/2020  . Hepatitis C Screening  Completed    Lab Results  Component Value Date   WBC 4.8 02/10/2017   HGB 12.5 02/10/2017   HCT 38.2 02/10/2017   PLT 201.0 02/10/2017   GLUCOSE 82 10/18/2016   CHOL 180 10/18/2016   TRIG 142.0 10/18/2016   HDL 68.20 10/18/2016   LDLCALC 83 10/18/2016   ALT 18 10/18/2016   AST 25 10/18/2016   NA 138 10/18/2016   K 4.4 10/18/2016   CL 102 10/18/2016   CREATININE 0.60 10/18/2016   BUN 19 10/18/2016   CO2 31 10/18/2016   TSH 3.01 10/18/2016   INR 1.2 01/02/2006    Lab Results  Component Value Date   TSH 3.01 10/18/2016   Lab Results  Component Value Date   WBC 4.8 02/10/2017   HGB 12.5 02/10/2017   HCT 38.2 02/10/2017   MCV 97.1 02/10/2017   PLT 201.0 02/10/2017   Lab Results  Component Value Date   NA 138 10/18/2016   K 4.4 10/18/2016   CO2 31 10/18/2016   GLUCOSE 82 10/18/2016   BUN 19 10/18/2016   CREATININE 0.60 10/18/2016   BILITOT 0.5 10/18/2016   ALKPHOS 40 10/18/2016   AST 25 10/18/2016   ALT 18 10/18/2016   PROT 6.8  10/18/2016   ALBUMIN 4.3 10/18/2016   CALCIUM 9.6 10/18/2016   ANIONGAP 6 03/08/2014   GFR 108.82 10/18/2016   Lab Results  Component Value Date   CHOL 180 10/18/2016   Lab Results  Component Value Date   HDL 68.20 10/18/2016   Lab Results  Component Value Date   LDLCALC 83 10/18/2016   Lab Results  Component Value Date   TRIG 142.0 10/18/2016   Lab Results  Component Value Date   CHOLHDL 3 10/18/2016   No results found for: HGBA1C       Assessment & Plan:   Problem List Items Addressed This Visit    None    Visit Diagnoses    Upper respiratory tract infection, unspecified type    -  Primary   Relevant Medications   fluticasone (FLONASE) 50 MCG/ACT nasal spray    claritin / flonase  Ibuprofen/ tylenol  Call or rto next week if no better  Sat clinic available if needed tomorrow   I am having [name] start on fluticasone. I am also having her maintain her multivitamin, vitamin C, vitamin E, Calcium Carbonate-Vitamin D, ALPRAZolam, FLUoxetine, cholecalciferol, and levothyroxine.  Meds ordered this encounter  Medications  . fluticasone (FLONASE) 50 MCG/ACT nasal spray    Sig: Place 2 sprays into both nostrils daily.    Dispense:  16 g    Refill:  6    CMA served as scribe during this visit. History, Physical and Plan  performed by medical provider. Documentation and orders reviewed and attested to.  [name], DO

## 2017-10-20 NOTE — Patient Instructions (Signed)
Upper Respiratory Infection, Adult Most upper respiratory infections (URIs) are caused by a virus. A URI affects the nose, throat, and upper air passages. The most common type of URI is often called "the common cold." Follow these instructions at home:  Take medicines only as told by your doctor.  Gargle warm saltwater or take cough drops to comfort your throat as told by your doctor.  Use a warm mist humidifier or inhale steam from a shower to increase air moisture. This may make it easier to breathe.  Drink enough fluid to keep your pee (urine) clear or pale yellow.  Eat soups and other clear broths.  Have a healthy diet.  Rest as needed.  Go back to work when your fever is gone or your doctor says it is okay. ? You may need to stay home longer to avoid giving your URI to others. ? You can also wear a face mask and wash your hands often to prevent spread of the virus.  Use your inhaler more if you have asthma.  Do not use any tobacco products, including cigarettes, chewing tobacco, or electronic cigarettes. If you need help quitting, ask your doctor. Contact a doctor if:  You are getting worse, not better.  Your symptoms are not helped by medicine.  You have chills.  You are getting more short of breath.  You have brown or red mucus.  You have yellow or brown discharge from your nose.  You have pain in your face, especially when you bend forward.  You have a fever.  You have puffy (swollen) neck glands.  You have pain while swallowing.  You have white areas in the back of your throat. Get help right away if:  You have very bad or constant: ? Headache. ? Ear pain. ? Pain in your forehead, behind your eyes, and over your cheekbones (sinus pain). ? Chest pain.  You have long-lasting (chronic) lung disease and any of the following: ? Wheezing. ? Long-lasting cough. ? Coughing up blood. ? A change in your usual mucus.  You have a stiff neck.  You have  changes in your: ? Vision. ? Hearing. ? Thinking. ? Mood. This information is not intended to replace advice given to you by your health care provider. Make sure you discuss any questions you have with your health care provider. Document Released: 07/27/2007 Document Revised: 10/11/2015 Document Reviewed: 05/15/2013 Elsevier Interactive Patient Education  2018 Elsevier Inc.  

## 2017-10-25 ENCOUNTER — Other Ambulatory Visit: Payer: Self-pay | Admitting: Family Medicine

## 2017-10-25 DIAGNOSIS — E038 Other specified hypothyroidism: Secondary | ICD-10-CM

## 2017-11-09 ENCOUNTER — Encounter: Payer: Self-pay | Admitting: Family Medicine

## 2017-11-09 ENCOUNTER — Ambulatory Visit (INDEPENDENT_AMBULATORY_CARE_PROVIDER_SITE_OTHER): Payer: BLUE CROSS/BLUE SHIELD | Admitting: Family Medicine

## 2017-11-09 VITALS — BP 103/61 | HR 75 | Temp 98.9°F | Resp 16 | Ht 67.0 in | Wt 113.2 lb

## 2017-11-09 DIAGNOSIS — Z23 Encounter for immunization: Secondary | ICD-10-CM | POA: Diagnosis not present

## 2017-11-09 DIAGNOSIS — R5383 Other fatigue: Secondary | ICD-10-CM | POA: Diagnosis not present

## 2017-11-09 DIAGNOSIS — E039 Hypothyroidism, unspecified: Secondary | ICD-10-CM

## 2017-11-09 DIAGNOSIS — F418 Other specified anxiety disorders: Secondary | ICD-10-CM | POA: Diagnosis not present

## 2017-11-09 DIAGNOSIS — R946 Abnormal results of thyroid function studies: Secondary | ICD-10-CM | POA: Diagnosis not present

## 2017-11-09 NOTE — Assessment & Plan Note (Signed)
Check labs  If all normal she may want to discuss with her psych

## 2017-11-09 NOTE — Assessment & Plan Note (Signed)
Check labs 

## 2017-11-09 NOTE — Patient Instructions (Signed)
Hypothyroidism Hypothyroidism is a disorder of the thyroid. The thyroid is a large gland that is located in the lower front of the neck. The thyroid releases hormones that control how the body works. With hypothyroidism, the thyroid does not make enough of these hormones. What are the causes? Causes of hypothyroidism may include:  Viral infections.  Pregnancy.  Your own defense system (immune system) attacking your thyroid.  Certain medicines.  Birth defects.  Past radiation treatments to your head or neck.  Past treatment with radioactive iodine.  Past surgical removal of part or all of your thyroid.  Problems with the gland that is located in the center of your brain (pituitary).  What are the signs or symptoms? Signs and symptoms of hypothyroidism may include:  Feeling as though you have no energy (lethargy).  Inability to tolerate cold.  Weight gain that is not explained by a change in diet or exercise habits.  Dry skin.  Coarse hair.  Menstrual irregularity.  Slowing of thought processes.  Constipation.  Sadness or depression.  How is this diagnosed? Your health care provider may diagnose hypothyroidism with blood tests and ultrasound tests. How is this treated? Hypothyroidism is treated with medicine that replaces the hormones that your body does not make. After you begin treatment, it may take several weeks for symptoms to go away. Follow these instructions at home:  Take medicines only as directed by your health care provider.  If you start taking any new medicines, tell your health care provider.  Keep all follow-up visits as directed by your health care provider. This is important. As your condition improves, your dosage needs may change. You will need to have blood tests regularly so that your health care provider can watch your condition. Contact a health care provider if:  Your symptoms do not get better with treatment.  You are taking thyroid  replacement medicine and: ? You sweat excessively. ? You have tremors. ? You feel anxious. ? You lose weight rapidly. ? You cannot tolerate heat. ? You have emotional swings. ? You have diarrhea. ? You feel weak. Get help right away if:  You develop chest pain.  You develop an irregular heartbeat.  You develop a rapid heartbeat. This information is not intended to replace advice given to you by your health care provider. Make sure you discuss any questions you have with your health care provider. Document Released: 02/07/2005 Document Revised: 07/16/2015 Document Reviewed: 06/25/2013 Elsevier Interactive Patient Education  2018 Elsevier Inc.  

## 2017-11-09 NOTE — Progress Notes (Signed)
Patient ID: Kathryn Mcdaniel, female    DOB: 1955/11/07  Age: 62 y.o. MRN: [id number]    Subjective:  Subjective  HPI Kathryn Mcdaniel presents for f/u thyroid .  She has been extremely tired and would like labs done.  No other complaints.    Review of Systems  Constitutional: Negative for fever.  HENT: Negative for congestion.   Respiratory: Negative for shortness of breath.   Cardiovascular: Negative for chest pain, palpitations and leg swelling.  Gastrointestinal: Negative for abdominal pain, blood in stool and nausea.  Genitourinary: Negative for dysuria and frequency.  Skin: Negative for rash.  Allergic/Immunologic: Negative for environmental allergies.  Neurological: Negative for dizziness and headaches.  Psychiatric/Behavioral: The patient is not nervous/anxious.     History Past Medical History:  Diagnosis Date  . Abnormal breast biopsy   . Basal cell carcinoma of skin    left temple  . Breast cancer ([place])   . Depression   . 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 Personal history of cervical dysplasia   . Personal history of chemotherapy   . Personal history of radiation therapy   . Thyroid disease   . Unspecified personal history presenting hazards to health    Radiation Therapy    She has a past surgical history that includes Breast surgery; Breast biopsy; Nasal sinus surgery; Mass excision (Right, 08/09/2013); and Mastectomy (Right).   Her family history includes Breast cancer in her mother and paternal grandmother; Cancer in her brother, father, and other; Diabetes in her father; Hypertension in her father; Squamous cell carcinoma in her mother; Stroke in her maternal grandmother; Transient ischemic attack in her mother.She reports that she quit smoking about 33 years ago. She has never used smokeless tobacco. She reports that she  drinks about 14.0 standard drinks of alcohol per week. She reports that she does not use drugs.  Current Outpatient Medications on File Prior to Visit  Medication Sig Dispense Refill  . ALPRAZolam (XANAX) 0.5 MG tablet Take 1 tablet (0.5 mg total) by mouth at bedtime as needed. 30 tablet 0  . Ascorbic Acid (VITAMIN C) 1000 MG tablet Take 1,000 mg by mouth daily.    . Calcium Carbonate-Vitamin D (CALTRATE 600+D) 600-400 MG-UNIT per tablet Take 1 tablet by mouth 2 (two) times daily.    . cholecalciferol (VITAMIN D) 1000 UNITS tablet Take 1,000 Units by mouth daily.    Marland Kitchen FLUoxetine (PROZAC) 20 MG capsule Take 3 capsules (60 mg total) by mouth daily. 90 capsule 5  . levothyroxine (SYNTHROID, LEVOTHROID) 88 MCG tablet TAKE 1 TABLET BY MOUTH EVERY DAY 90 tablet 0  . Multiple Vitamin (MULTIVITAMIN) tablet Take 1 tablet by mouth daily.    . vitamin E 400 UNIT capsule Take 400 Units by mouth daily.     No current facility-administered medications on file prior to visit.      Objective:  Objective  Physical Exam  Constitutional: She is oriented to person, place, and time. She appears well-developed and well-nourished.  HENT:  Head: Normocephalic and atraumatic.  Eyes: Conjunctivae and EOM are normal.  Neck: Normal range of motion. Neck supple. No JVD present. Carotid bruit is not present. No thyromegaly present.  Cardiovascular: Normal rate, regular rhythm and normal heart sounds.  No murmur heard. Pulmonary/Chest: Effort normal and breath sounds normal. No respiratory distress. She has no wheezes. She has no rales.  She exhibits no tenderness.  Musculoskeletal: She exhibits no edema.  Neurological: She is alert and oriented to person, place, and time.  Psychiatric: She has a normal mood and affect.  Nursing note and vitals reviewed.  BP 103/61 (BP Location: Left Arm, Cuff Size: Normal)   Pulse 75   Temp 98.9 F (37.2 C) (Oral)   Resp 16   Ht '5\' 7"'  (1.702 m)   Wt 113 lb 3.2 oz (51.3 kg)    SpO2 100%   BMI 17.73 kg/m  Wt Readings from Last 3 Encounters:  11/09/17 113 lb 3.2 oz (51.3 kg)  10/20/17 116 lb 3.2 oz (52.7 kg)  02/10/17 116 lb 3.2 oz (52.7 kg)     Lab Results  Component Value Date   WBC 4.8 02/10/2017   HGB 12.5 02/10/2017   HCT 38.2 02/10/2017   PLT 201.0 02/10/2017   GLUCOSE 82 10/18/2016   CHOL 180 10/18/2016   TRIG 142.0 10/18/2016   HDL 68.20 10/18/2016   LDLCALC 83 10/18/2016   ALT 18 10/18/2016   AST 25 10/18/2016   NA 138 10/18/2016   K 4.4 10/18/2016   CL 102 10/18/2016   CREATININE 0.60 10/18/2016   BUN 19 10/18/2016   CO2 31 10/18/2016   TSH 3.01 10/18/2016   INR 1.2 01/02/2006    Mm Clip Placement Left  Result Date: 07/05/2017 CLINICAL DATA:  Evaluate clip placement EXAM: DIAGNOSTIC LEFT MAMMOGRAM POST MRI BIOPSY COMPARISON:  Previous exam(s). FINDINGS: Mammographic images were obtained following MRI guided biopsy of a left breast mass. The hour glass shaped clip is located approximately 12 o'clock in the left breast at a posterior depth in good position. IMPRESSION: Clip placement as above. Final Assessment: Post Procedure Mammograms for Marker Placement Electronically Signed   By: Dorise Bullion III M.D   On: 07/05/2017 09:11   Mr Lt Breast Bx Johnella Moloney Dev 1st Lesion Image Bx Spec Mr Guide  Addendum Date: 07/06/2017   ADDENDUM REPORT: 07/06/2017 14:25 ADDENDUM: Pathology revealed FIBROCYSTIC CHANGES WITH CALCIFICATIONS of the Left breast, upper central, (barbell clip). This was found to be concordant by Dr. Nolon Nations. Pathology results were discussed with the patient by telephone. The patient reported doing well after the biopsy with tenderness at the site. Post biopsy instructions and care were reviewed and questions were answered. The patient was encouraged to call [facility] for any additional concerns. The patient was instructed to return for a bilateral breast MRI in 6 months and informed a reminder  notice would be sent regarding this appointment. Pathology results reported by Terie Purser, RN on 07/06/2017. Electronically Signed   By: Nolon Nations M.D.   On: 07/06/2017 14:25   Result Date: 07/06/2017 CLINICAL DATA:  History of RIGHT breast cancer and mastectomy. Patient has non mass enhancement in the UPPER central portion of the LEFT. EXAM: MRI GUIDED CORE NEEDLE BIOPSY OF THE LEFT BREAST TECHNIQUE: Multiplanar, multisequence MR imaging of the LEFT breast was performed both before and after administration of intravenous contrast. CONTRAST:  48m MULTIHANCE GADOBENATE DIMEGLUMINE 529 MG/ML IV SOLN COMPARISON:  MRI on 06/21/2017 FINDINGS: I met with the patient, and we discussed the procedure of MRI guided biopsy, including risks, benefits, and alternatives. Specifically, we discussed the risks of infection, bleeding, tissue injury, clip migration, and inadequate sampling. Informed, written consent was given. The usual time out protocol was performed immediately prior to the procedure. Using sterile technique, 1% Lidocaine, MRI guidance, and a 9 gauge  vacuum assisted device, biopsy was performed of non mass enhancement in the UPPER central portion of the LEFT breast using a LATERAL to MEDIAL approach. At the conclusion of the procedure, a barbell shaped tissue marker clip was deployed into the biopsy cavity. Follow-up 2-view mammogram was performed and dictated separately. IMPRESSION: MRI guided biopsy of LEFT breast non mass enhancement. No apparent complications. Electronically Signed: By: Nolon Nations M.D. On: 07/05/2017 10:05     Assessment & Plan:  Plan  I have discontinued Kathryn Mcdaniel's fluticasone. I am also having her maintain her multivitamin, vitamin C, vitamin E, Calcium Carbonate-Vitamin D, ALPRAZolam, FLUoxetine, cholecalciferol, and levothyroxine.  No orders of the defined types were placed in this encounter.   Problem List Items Addressed This Visit      Unprioritized    Depression with anxiety    Check labs  If all normal she may want to discuss with her psych      Relevant Orders   Thyroid Panel With TSH   CBC with Differential/Platelet   Lipid panel   Comprehensive metabolic panel   Fatigue   Relevant Orders   Vitamin D 1,25 dihydroxy   Vitamin B12   Hypothyroid - Primary    Check labs       Relevant Orders   Thyroid Panel With TSH   Influenza vaccine administered   Relevant Orders   Flu Vaccine QUAD 36+ mos IM (Fluarix & Fluzone Quad PF (Completed)      Follow-up: Return in about 3 months (around 02/08/2018), or if symptoms worsen or fail to improve, for annual exam.  [name], DO

## 2017-11-10 LAB — COMPREHENSIVE METABOLIC PANEL
ALBUMIN: 4.2 g/dL (ref 3.5–5.2)
ALK PHOS: 53 U/L (ref 39–117)
ALT: 17 U/L (ref 0–35)
AST: 21 U/L (ref 0–37)
BUN: 23 mg/dL (ref 6–23)
CO2: 30 mEq/L (ref 19–32)
CREATININE: 0.55 mg/dL (ref 0.40–1.20)
Calcium: 9.2 mg/dL (ref 8.4–10.5)
Chloride: 103 mEq/L (ref 96–112)
GFR: 119.88 mL/min (ref >60.00)
Glucose, Bld: 74 mg/dL (ref 70–99)
Potassium: 4.5 mEq/L (ref 3.5–5.1)
SODIUM: 139 meq/L (ref 135–145)
TOTAL PROTEIN: 6.4 g/dL (ref 6.0–8.3)
Total Bilirubin: 0.5 mg/dL (ref 0.2–1.2)

## 2017-11-10 LAB — LIPID PANEL
Cholesterol: 162 mg/dL (ref 0–200)
HDL: 68.9 mg/dL (ref >39.00)
LDL Cholesterol: 76 mg/dL (ref 0–99)
NonHDL: 93.48
Total CHOL/HDL Ratio: 2
Triglycerides: 89 mg/dL (ref 0.0–149.0)
VLDL: 17.8 mg/dL (ref 0.0–40.0)

## 2017-11-10 LAB — CBC WITH DIFFERENTIAL/PLATELET
Basophils Absolute: 0 10*3/uL (ref 0.0–0.1)
Basophils Relative: 0.9 % (ref 0.0–3.0)
EOS PCT: 3.1 % (ref 0.0–5.0)
Eosinophils Absolute: 0.1 10*3/uL (ref 0.0–0.7)
HCT: 37.9 % (ref 36.0–46.0)
Hemoglobin: 13 g/dL (ref 12.0–15.0)
LYMPHS ABS: 1.4 10*3/uL (ref 0.7–4.0)
Lymphocytes Relative: 28.4 % (ref 12.0–46.0)
MCHC: 34.4 g/dL (ref 30.0–36.0)
MCV: 94.6 fl (ref 78.0–100.0)
MONO ABS: 0.4 10*3/uL (ref 0.1–1.0)
Monocytes Relative: 8 % (ref 3.0–12.0)
NEUTROS PCT: 59.6 % (ref 43.0–77.0)
Neutro Abs: 2.8 10*3/uL (ref 1.4–7.7)
Platelets: 216 10*3/uL (ref 150.0–400.0)
RBC: 4.01 Mil/uL (ref 3.87–5.11)
RDW: 12.3 % (ref 11.5–15.5)
WBC: 4.8 10*3/uL (ref 4.0–10.5)

## 2017-11-10 LAB — VITAMIN B12: Vitamin B-12: 523 pg/mL (ref 211–911)

## 2017-11-12 LAB — THYROID PANEL WITH TSH
Free Thyroxine Index: 2.4 (ref 1.4–3.8)
T3 UPTAKE: 34 % (ref 22–35)
T4 TOTAL: 7.1 ug/dL (ref 5.1–11.9)
TSH: 3.59 m[IU]/L (ref 0.40–4.50)

## 2017-11-12 LAB — VITAMIN D 1,25 DIHYDROXY
VITAMIN D 1, 25 (OH) TOTAL: 36 pg/mL (ref 18–72)
Vitamin D2 1, 25 (OH)2: <8 pg/mL
Vitamin D3 1, 25 (OH)2: 36 pg/mL

## 2017-11-13 ENCOUNTER — Encounter: Payer: Self-pay | Admitting: Family Medicine

## 2017-11-15 ENCOUNTER — Encounter: Payer: Self-pay | Admitting: Family Medicine

## 2017-11-15 NOTE — Telephone Encounter (Signed)
Thyroid Panel With TSH (Order [id number])  Result Notes for Thyroid Panel With TSH   Notes recorded by Damita Dunnings, CMA on 11/10/2017 at 10:41 AM EDT Viewed by [name] on 11/10/2017 8:44 AM ------  Notes recorded by [name], DO on 11/10/2017 at 8:32 AM EDT Thyroid normal

## 2017-11-16 ENCOUNTER — Telehealth: Payer: Self-pay | Admitting: Family Medicine

## 2017-11-16 NOTE — Telephone Encounter (Signed)
Copied from [place] [id number]. Topic: Quick Communication - Lab Results >> Nov 16, 2017 12:33 PM Cecelia Byars, NT wrote: Patient is calling requesting lab results ,please advise

## 2017-11-16 NOTE — Telephone Encounter (Signed)
Result Notes for Thyroid Panel With TSH   Notes recorded by Damita Dunnings, CMA on 11/10/2017 at 10:41 AM EDT Viewed by [name] on 11/10/2017 8:44 AM ------  Notes recorded by [name], DO on 11/10/2017 at 8:32 AM EDT Thyroid normal

## 2018-01-12 ENCOUNTER — Other Ambulatory Visit: Payer: Self-pay | Admitting: Family Medicine

## 2018-01-12 DIAGNOSIS — E038 Other specified hypothyroidism: Secondary | ICD-10-CM

## 2018-01-16 ENCOUNTER — Encounter: Payer: Self-pay | Admitting: Family Medicine

## 2018-01-16 ENCOUNTER — Other Ambulatory Visit: Payer: Self-pay | Admitting: Family Medicine

## 2018-01-16 DIAGNOSIS — Z853 Personal history of malignant neoplasm of breast: Secondary | ICD-10-CM

## 2018-01-16 DIAGNOSIS — Z1231 Encounter for screening mammogram for malignant neoplasm of breast: Secondary | ICD-10-CM

## 2018-01-29 ENCOUNTER — Other Ambulatory Visit: Payer: Self-pay | Admitting: Family Medicine

## 2018-01-31 ENCOUNTER — Ambulatory Visit
Admission: RE | Admit: 2018-01-31 | Discharge: 2018-01-31 | Disposition: A | Discharge status: Home / Self Care | Payer: BLUE CROSS/BLUE SHIELD | Source: Ambulatory Visit | Attending: Family Medicine | Admitting: Family Medicine

## 2018-01-31 ENCOUNTER — Telehealth: Payer: Self-pay | Admitting: Family Medicine

## 2018-01-31 ENCOUNTER — Other Ambulatory Visit: Payer: Self-pay | Admitting: Family Medicine

## 2018-01-31 DIAGNOSIS — Z853 Personal history of malignant neoplasm of breast: Secondary | ICD-10-CM

## 2018-01-31 DIAGNOSIS — Z1231 Encounter for screening mammogram for malignant neoplasm of breast: Secondary | ICD-10-CM

## 2018-01-31 DIAGNOSIS — N6489 Other specified disorders of breast: Secondary | ICD-10-CM | POA: Diagnosis not present

## 2018-01-31 MED ORDER — GADOBUTROL 1 MMOL/ML IV SOLN
5.0000 mL | Freq: Once | INTRAVENOUS | Status: AC | PRN
Start: 2018-01-31 — End: 2018-01-31
  Administered 2018-01-31: 5 mL via INTRAVENOUS

## 2018-01-31 NOTE — Telephone Encounter (Signed)
I am asked to do an urgent peer to peer review for his patient who is at the breast imaging center now   Member ID is [id number] Trying to do breast MRI which was recommended by radiologist back in May following her bx.   Direct number for tech  [place] and was able to get pre-cert number Called noelle  [id number] 03/01/18

## 2018-04-11 DIAGNOSIS — H2513 Age-related nuclear cataract, bilateral: Secondary | ICD-10-CM | POA: Diagnosis not present

## 2018-04-12 ENCOUNTER — Other Ambulatory Visit: Payer: Self-pay | Admitting: Family Medicine

## 2018-04-12 DIAGNOSIS — E038 Other specified hypothyroidism: Secondary | ICD-10-CM

## 2018-04-13 MED ORDER — LEVOTHYROXINE SODIUM 88 MCG PO TABS
88.0000 ug | ORAL_TABLET | Freq: Every day | ORAL | 0 refills | Status: DC
Start: 2018-04-13 — End: 2018-05-07

## 2018-04-24 DIAGNOSIS — H2511 Age-related nuclear cataract, right eye: Secondary | ICD-10-CM | POA: Diagnosis not present

## 2018-04-24 DIAGNOSIS — H40013 Open angle with borderline findings, low risk, bilateral: Secondary | ICD-10-CM | POA: Diagnosis not present

## 2018-04-24 DIAGNOSIS — H25043 Posterior subcapsular polar age-related cataract, bilateral: Secondary | ICD-10-CM | POA: Diagnosis not present

## 2018-04-24 DIAGNOSIS — H18413 Arcus senilis, bilateral: Secondary | ICD-10-CM | POA: Diagnosis not present

## 2018-04-24 DIAGNOSIS — H2513 Age-related nuclear cataract, bilateral: Secondary | ICD-10-CM | POA: Diagnosis not present

## 2018-05-01 ENCOUNTER — Ambulatory Visit (INDEPENDENT_AMBULATORY_CARE_PROVIDER_SITE_OTHER): Payer: BLUE CROSS/BLUE SHIELD | Admitting: Family Medicine

## 2018-05-01 ENCOUNTER — Encounter: Payer: Self-pay | Admitting: Family Medicine

## 2018-05-01 VITALS — BP 99/62 | HR 81 | Temp 98.4°F | Ht 67.0 in | Wt 116.0 lb

## 2018-05-01 DIAGNOSIS — I7300 Raynaud's syndrome without gangrene: Secondary | ICD-10-CM | POA: Diagnosis not present

## 2018-05-01 NOTE — Progress Notes (Signed)
Patient ID: Kathryn Mcdaniel, female    DOB: [birth year]  Age: 63 y.o. MRN: [id number]    Subjective:  Subjective  HPI Kathryn Mcdaniel presents for pt is having numbness and tingling in her feet that has worsened.  She also c/o blisters forming at times on her toes.  She has a hx of raynauds  Review of Systems  Constitutional: Negative for appetite change, diaphoresis, fatigue and unexpected weight change.  Eyes: Negative for pain, redness and visual disturbance.  Respiratory: Negative for cough, chest tightness, shortness of breath and wheezing.   Cardiovascular: Negative for chest pain, palpitations and leg swelling.  Endocrine: Negative for cold intolerance, heat intolerance, polydipsia, polyphagia and polyuria.  Genitourinary: Negative for difficulty urinating, dysuria and frequency.  Neurological: Positive for numbness. Negative for dizziness, light-headedness and headaches.  Hematological: Negative for adenopathy. Does not bruise/bleed easily.    History Past Medical History:  Diagnosis Date  . Abnormal breast biopsy   . Basal cell carcinoma of skin    left temple  . Breast cancer ([place])   . Depression   . 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 Personal history of cervical dysplasia   . Personal history of chemotherapy   . Personal history of radiation therapy   . Thyroid disease   . Unspecified personal history presenting hazards to health    Radiation Therapy    She has a past surgical history that includes Breast surgery; Breast biopsy; Nasal sinus surgery; Mass excision (Right, 08/09/2013); and Mastectomy (Right).   Her family history includes Breast cancer in her mother and paternal grandmother; Cancer in her brother, father, and another family member; Diabetes in her father; Hypertension in her father; Squamous cell carcinoma in her  mother; Stroke in her maternal grandmother; Transient ischemic attack in her mother.She reports that she quit smoking about 33 years ago. She has never used smokeless tobacco. She reports current alcohol use of about 14.0 standard drinks of alcohol per week. She reports that she does not use drugs.  Current Outpatient Medications on File Prior to Visit  Medication Sig Dispense Refill  . ALPRAZolam (XANAX) 0.5 MG tablet Take 1 tablet (0.5 mg total) by mouth at bedtime as needed. 30 tablet 0  . Ascorbic Acid (VITAMIN C) 1000 MG tablet Take 1,000 mg by mouth daily.    . Calcium Carbonate-Vitamin D (CALTRATE 600+D) 600-400 MG-UNIT per tablet Take 1 tablet by mouth 2 (two) times daily.    . cholecalciferol (VITAMIN D) 1000 UNITS tablet Take 1,000 Units by mouth daily.    Marland Kitchen FLUoxetine (PROZAC) 40 MG capsule Take 40 mg by mouth 2 (two) times daily.    Marland Kitchen levothyroxine (SYNTHROID, LEVOTHROID) 88 MCG tablet Take 1 tablet (88 mcg total) by mouth daily before breakfast. 30 tablet 0  . Multiple Vitamin (MULTIVITAMIN) tablet Take 1 tablet by mouth daily.    . vitamin E 400 UNIT capsule Take 400 Units by mouth daily.     No current facility-administered medications on file prior to visit.      Objective:  Objective  Physical Exam Vitals signs and nursing note reviewed.  Constitutional:      Appearance: She is well-developed.  HENT:     Head: Normocephalic and atraumatic.  Eyes:     Conjunctiva/sclera: Conjunctivae normal.  Neck:     Musculoskeletal: Normal range of motion and neck supple.  Thyroid: No thyromegaly.     Vascular: No carotid bruit or JVD.  Cardiovascular:     Rate and Rhythm: Normal rate and regular rhythm.     Heart sounds: Normal heart sounds. No murmur.  Pulmonary:     Effort: Pulmonary effort is normal. No respiratory distress.     Breath sounds: Normal breath sounds. No wheezing or rales.  Chest:     Chest wall: No tenderness.  Skin:    General: Skin is warm and dry.      Findings: No erythema or rash.  Neurological:     General: No focal deficit present.     Mental Status: She is alert and oriented to person, place, and time.     Sensory: No sensory deficit.     Motor: No weakness.     Coordination: Coordination normal.     Gait: Gait normal.     Deep Tendon Reflexes: Reflexes normal.    BP 99/62 (BP Location: Left Arm, Patient Position: Sitting, Cuff Size: Normal)   Pulse 81   Temp 98.4 F (36.9 C)   Ht 5\' 7"  (1.702 m)   Wt 116 lb (52.6 kg)   SpO2 99%   BMI 18.17 kg/m  Wt Readings from Last 3 Encounters:  05/01/18 116 lb (52.6 kg)  11/09/17 113 lb 3.2 oz (51.3 kg)  10/20/17 116 lb 3.2 oz (52.7 kg)     Lab Results  Component Value Date   WBC 4.8 11/09/2017   HGB 13.0 11/09/2017   HCT 37.9 11/09/2017   PLT 216.0 11/09/2017   GLUCOSE 74 11/09/2017   CHOL 162 11/09/2017   TRIG 89.0 11/09/2017   HDL 68.90 11/09/2017   LDLCALC 76 11/09/2017   ALT 17 11/09/2017   AST 21 11/09/2017   NA 139 11/09/2017   K 4.5 11/09/2017   CL 103 11/09/2017   CREATININE 0.55 11/09/2017   BUN 23 11/09/2017   CO2 30 11/09/2017   TSH 3.59 11/09/2017   INR 1.2 01/02/2006    Mr Breast Bilateral W Wo Contrast Inc Cad  Result Date: 01/31/2018 CLINICAL DATA:  63 year old female with increase lifetime risk of breast cancer status post right mastectomy. The patient had a benign MRI guided left breast biopsy in May 2019 demonstrating fibrocystic changes. LABS:  Creatinine was obtained on site at [facility] at [address] Results: Creatinine 0.6 mg/dL. EXAM: BILATERAL BREAST MRI WITH AND WITHOUT CONTRAST TECHNIQUE: Multiplanar, multisequence MR images of both breasts were obtained prior to and following the intravenous administration of 5 ml of Gadavist Three-dimensional MR images were rendered by post-processing of the original MR data on an independent workstation. The three-dimensional MR images were interpreted, and findings are reported in the  following complete MRI report for this study. Three dimensional images were evaluated at the independent DynaCad workstation COMPARISON:  Previous exam(s). FINDINGS: Breast composition: d. Extreme fibroglandular tissue. Background parenchymal enhancement: Minimal. Right breast: Patient is status post right mastectomy. No abnormal enhancement is identified within the mastectomy bed or along the right chest wall. Left breast: No mass or abnormal enhancement. Signal void from post biopsy clip is demonstrated in the upper outer right breast. There's no associated enhancement on today's study. Lymph nodes: No abnormal appearing lymph nodes. Ancillary findings:  None. IMPRESSION: 1. Post biopsy changes of the left breast without MRI evidence of malignancy. 2. Status post right mastectomy. RECOMMENDATION: Patient is due for annual left breast mammographic screening in April 2020. BI-RADS CATEGORY  2: Benign.  Electronically Signed   By: Kristopher Oppenheim M.D.   On: 01/31/2018 16:08     Assessment & Plan:  Plan  I am having [name] maintain her multivitamin, vitamin C, vitamin E, Calcium Carbonate-Vitamin D, ALPRAZolam, cholecalciferol, levothyroxine, and FLUoxetine.  No orders of the defined types were placed in this encounter.   Problem List Items Addressed This Visit    None    Visit Diagnoses    Raynaud's phenomenon without gangrene    -  Primary   Relevant Orders   Ambulatory referral to Rheumatology   Antinuclear Antib (ANA)   CBC with Differential/Platelet   Comprehensive metabolic panel   Sedimentation rate      Follow-up: Return if symptoms worsen or fail to improve.  [name], DO

## 2018-05-01 NOTE — Patient Instructions (Signed)
Raynaud Phenomenon    Raynaud phenomenon is a condition that affects the blood vessels (arteries) that carry blood to your fingers and toes. The arteries that supply blood to your ears, lips, nipples, or the tip of your nose might also be affected. Raynaud phenomenon causes the arteries to become narrow temporarily (spasm). As a result, the flow of blood to the affected areas is temporarily decreased. This usually occurs in response to cold temperatures or stress. During an attack, the skin in the affected areas turns white, then blue, and finally red. You may also feel tingling or numbness in those areas.  Attacks usually last for only a brief period, and then the blood flow to the area returns to normal. In most cases, Raynaud phenomenon does not cause serious health problems.  What are the causes?  In many cases, the cause of this condition is not known. The condition may occur on its own (primary Raynaud phenomenon) or may be associated with other diseases or factors (secondary Raynaud phenomenon).  Possible causes may include:  · Diseases or medical conditions that damage the arteries.  · Injuries and repetitive actions that hurt the hands or feet.  · Being exposed to certain chemicals.  · Taking medicines that narrow the arteries.  · Other medical conditions, such as lupus, scleroderma, rheumatoid arthritis, thyroid problems, blood disorders, Sjogren syndrome, or atherosclerosis.  What increases the risk?  The following factors may make you more likely to develop this condition:  · Being 20-40 years old.  · Being female.  · Having a family history of Raynaud phenomenon.  · Living in a cold climate.  · Smoking.  What are the signs or symptoms?  Symptoms of this condition usually occur when you are exposed to cold temperatures or when you have emotional stress. The symptoms may last for a few minutes or up to several hours. They usually affect your fingers but may also affect your toes, nipples, lips, ears, or  the tip of your nose. Symptoms may include:  · Changes in skin color. The skin in the affected areas will turn pale or white. The skin may then change from white to bluish to red as normal blood flow returns to the area.  · Numbness, tingling, or pain in the affected areas.  In severe cases, symptoms may include:  · Skin sores.  · Tissues decaying and dying (gangrene).  How is this diagnosed?  This condition may be diagnosed based on:  · Your symptoms and medical history.  · A physical exam. During the exam, you may be asked to put your hands in cold water to check for a reaction to cold temperature.  · Tests, such as:  ? Blood tests to check for other diseases or conditions.  ? A test to check the movement of blood through your arteries and veins (vascular ultrasound).  ? A test in which the skin at the base of your fingernail is examined under a microscope (nailfold capillaroscopy).  How is this treated?  Treatment for this condition often involves making lifestyle changes and taking steps to control your exposure to cold temperatures. For more severe cases, medicine (calcium channel blockers) may be used to improve blood flow. Surgery is sometimes done to block the nerves that control the affected arteries, but this is rare.  Follow these instructions at home:  Avoiding cold temperatures  Take these steps to avoid exposure to cold:  · If possible, stay indoors during cold weather.  · When you   go outside during cold weather, dress in layers and wear mittens, a hat, a scarf, and warm footwear.  · Wear mittens or gloves when handling ice or frozen food.  · Use holders for glasses or cans containing cold drinks.  · Let warm water run for a while before taking a shower or bath.  · Warm up the car before driving in cold weather.  Lifestyle    · If possible, avoid stressful and emotional situations. Try to find ways to manage your stress, such as:  ? Exercise.  ? Yoga.  ? Meditation.  ? Biofeedback.  · Do not use any  products that contain nicotine or tobacco, such as cigarettes and e-cigarettes. If you need help quitting, ask your health care provider.  · Avoid secondhand smoke.  · Limit your use of caffeine.  ? Switch to decaffeinated coffee, tea, and soda.  ? Avoid chocolate.  · Avoid vibrating tools and machinery.  General instructions  · Protect your hands and feet from injuries, cuts, or bruises.  · Avoid wearing tight rings or wristbands.  · Wear loose fitting socks and comfortable, roomy shoes.  · Take over-the-counter and prescription medicines only as told by your health care provider.  Contact a health care provider if:  · Your discomfort becomes worse despite lifestyle changes.  · You develop sores on your fingers or toes that do not heal.  · Your fingers or toes turn black.  · You have breaks in the skin on your fingers or toes.  · You have a fever.  · You have pain or swelling in your joints.  · You have a rash.  · Your symptoms occur on only one side of your body.  Summary  · Raynaud phenomenon is a condition that affects the arteries that carry blood to your fingers, toes, ears, lips, nipples, or the tip of your nose.  · In many cases, the cause of this condition is not known.  · Symptoms of this condition include changes in skin color, and numbness and tingling of the affected area.  · Treatment for this condition includes lifestyle changes, reducing exposure to cold temperatures, and using medicines for severe cases of the condition.  · Contact your health care provider if your condition worsens despite treatment.  This information is not intended to replace advice given to you by your health care provider. Make sure you discuss any questions you have with your health care provider.  Document Released: 02/05/2000 Document Revised: 08/23/2016 Document Reviewed: 03/21/2016  Elsevier Interactive Patient Education © 2019 [employer]

## 2018-05-02 ENCOUNTER — Encounter: Payer: Self-pay | Admitting: Family Medicine

## 2018-05-02 LAB — CBC WITH DIFFERENTIAL/PLATELET
BASOS ABS: 0 10*3/uL (ref 0.0–0.1)
BASOS PCT: 0.4 % (ref 0.0–3.0)
Eosinophils Absolute: 0.2 10*3/uL (ref 0.0–0.7)
Eosinophils Relative: 2.8 % (ref 0.0–5.0)
HEMATOCRIT: 39.3 % (ref 36.0–46.0)
Hemoglobin: 13.3 g/dL (ref 12.0–15.0)
LYMPHS PCT: 26.3 % (ref 12.0–46.0)
Lymphs Abs: 1.4 10*3/uL (ref 0.7–4.0)
MCHC: 33.8 g/dL (ref 30.0–36.0)
MCV: 96.3 fl (ref 78.0–100.0)
MONOS PCT: 7.7 % (ref 3.0–12.0)
Monocytes Absolute: 0.4 10*3/uL (ref 0.1–1.0)
NEUTROS ABS: 3.3 10*3/uL (ref 1.4–7.7)
Neutrophils Relative %: 62.8 % (ref 43.0–77.0)
PLATELETS: 203 10*3/uL (ref 150.0–400.0)
RBC: 4.08 Mil/uL (ref 3.87–5.11)
RDW: 12.6 % (ref 11.5–15.5)
WBC: 5.3 10*3/uL (ref 4.0–10.5)

## 2018-05-02 LAB — COMPREHENSIVE METABOLIC PANEL
ALBUMIN: 4.6 g/dL (ref 3.5–5.2)
ALK PHOS: 58 U/L (ref 39–117)
ALT: 20 U/L (ref 0–35)
AST: 22 U/L (ref 0–37)
BILIRUBIN TOTAL: 0.5 mg/dL (ref 0.2–1.2)
BUN: 20 mg/dL (ref 6–23)
CALCIUM: 9.5 mg/dL (ref 8.4–10.5)
CO2: 31 meq/L (ref 19–32)
Chloride: 101 mEq/L (ref 96–112)
Creatinine, Ser: 0.6 mg/dL (ref 0.40–1.20)
GFR: 101.85 mL/min (ref >60.00)
Glucose, Bld: 74 mg/dL (ref 70–99)
Potassium: 4.5 mEq/L (ref 3.5–5.1)
Sodium: 139 mEq/L (ref 135–145)
TOTAL PROTEIN: 6.7 g/dL (ref 6.0–8.3)

## 2018-05-02 LAB — SEDIMENTATION RATE: SED RATE: 1 mm/h (ref 0–30)

## 2018-05-03 LAB — ANA: ANA: NEGATIVE

## 2018-05-04 NOTE — Telephone Encounter (Signed)
Notes recorded by Damita Dunnings, [place] on 05/04/2018 at 9:52 AM EDT Viewed by [name] on 05/03/2018 7:45 PM ------  Notes recorded by [name], DO on 05/03/2018 at 11:40 AM EDT Normal labs

## 2018-05-06 ENCOUNTER — Other Ambulatory Visit: Payer: Self-pay | Admitting: Family Medicine

## 2018-05-06 DIAGNOSIS — E038 Other specified hypothyroidism: Secondary | ICD-10-CM

## 2018-05-08 ENCOUNTER — Encounter: Payer: Self-pay | Admitting: Family Medicine

## 2018-06-01 ENCOUNTER — Other Ambulatory Visit: Payer: Self-pay | Admitting: Family Medicine

## 2018-06-01 DIAGNOSIS — Z1231 Encounter for screening mammogram for malignant neoplasm of breast: Secondary | ICD-10-CM

## 2018-06-29 ENCOUNTER — Other Ambulatory Visit: Payer: Self-pay | Admitting: Family Medicine

## 2018-06-29 DIAGNOSIS — E038 Other specified hypothyroidism: Secondary | ICD-10-CM

## 2018-08-01 ENCOUNTER — Ambulatory Visit
Admission: RE | Admit: 2018-08-01 | Discharge: 2018-08-01 | Disposition: A | Discharge status: Home / Self Care | Payer: BC Managed Care – PPO | Source: Ambulatory Visit | Attending: Family Medicine | Admitting: Family Medicine

## 2018-08-01 ENCOUNTER — Other Ambulatory Visit: Payer: Self-pay

## 2018-08-01 DIAGNOSIS — Z1231 Encounter for screening mammogram for malignant neoplasm of breast: Secondary | ICD-10-CM | POA: Diagnosis not present

## 2018-08-22 ENCOUNTER — Other Ambulatory Visit: Payer: Self-pay | Admitting: Family Medicine

## 2018-08-22 DIAGNOSIS — E038 Other specified hypothyroidism: Secondary | ICD-10-CM

## 2018-08-23 IMAGING — MR MR BREAST BIOPSY
9 of 12 series · 35 of 48 positions shown · IV contrast (10ml Multihance)
Comparison: MRI on 06/21/2017

ADDENDUM:
Pathology revealed FIBROCYSTIC CHANGES WITH CALCIFICATIONS of the
Left breast, upper central, (barbell clip). This was found to be
concordant by Dr. Nana Hemaa Bianca.

Pathology results were discussed with the patient by telephone. The
patient reported doing well after the biopsy with tenderness at the
site. Post biopsy instructions and care were reviewed and questions
were answered. The patient was encouraged to call The [REDACTED]
The patient was instructed to return for a bilateral breast MRI in 6
months and informed a reminder notice would be sent regarding this
appointment.
Pathology results reported by [place], RN on 07/06/2017.
CLINICAL DATA: History of RIGHT breast cancer and mastectomy.
Patient has non mass enhancement in the UPPER central portion of the
LEFT.
EXAM:
MRI GUIDED CORE NEEDLE BIOPSY OF THE LEFT BREAST
TECHNIQUE: Multiplanar, multisequence MR imaging of the LEFT breast was
performed both before and after administration of intravenous
contrast.
CONTRAST:  10mL MULTIHANCE GADOBENATE DIMEGLUMINE 529 MG/ML IV SOLN

[Series 4: fiducial unilateral · sagittal · 2.0mm · 1.33mm/px · 3 of 52 slices shown]
[im 1/52]
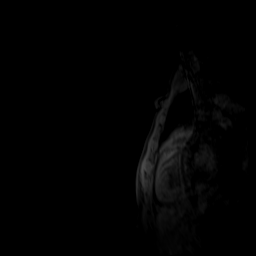
[im 26/52]
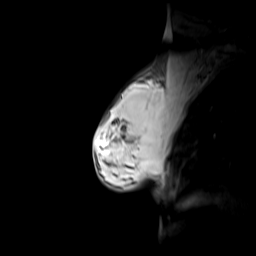
[im 52/52]
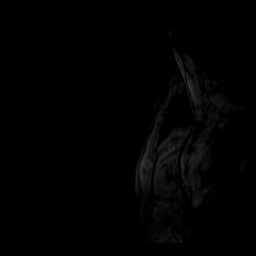

[Series 5: dynamic pre · axial · non-contrast · 1.3mm · 0.73mm/px · z∈[-97,+89]mm · 5 of 144 slices shown]
[im 1/144]
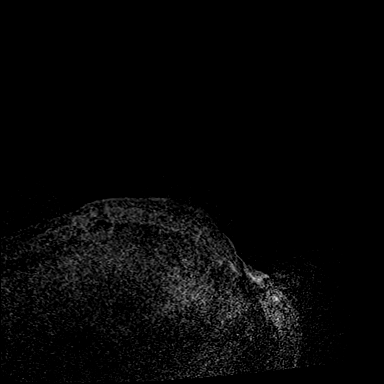
[im 36/144]
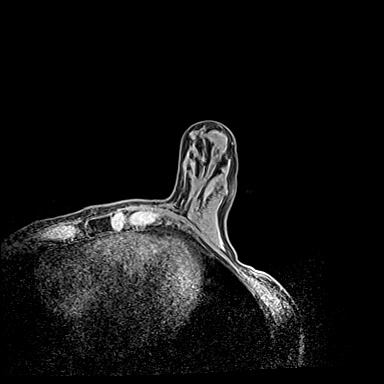
[im 72/144]
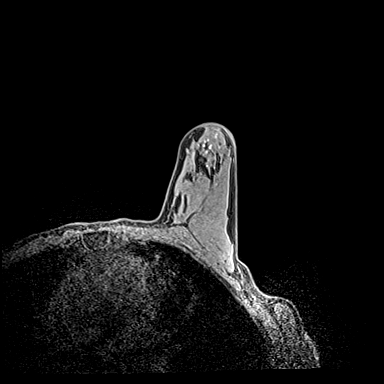
[im 108/144]
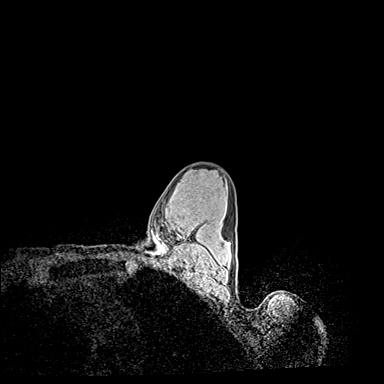
[im 144/144]
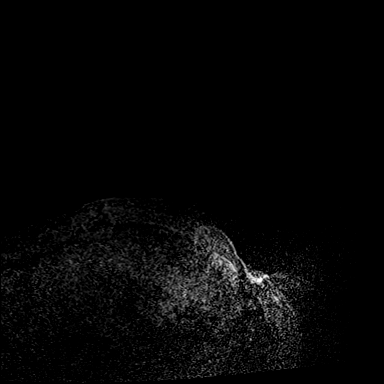

[Series 6: dynamic post 20 · axial · 1.3mm · 0.73mm/px · z∈[-97,+89]mm · 4 of 144 slices shown (1 of 2)]
[im 1/144]
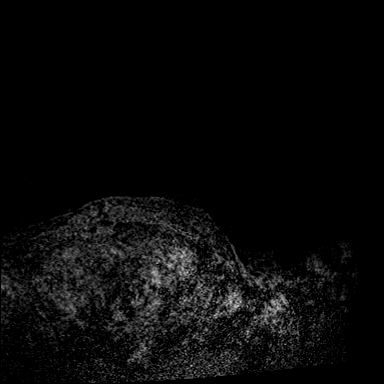
[im 48/144]
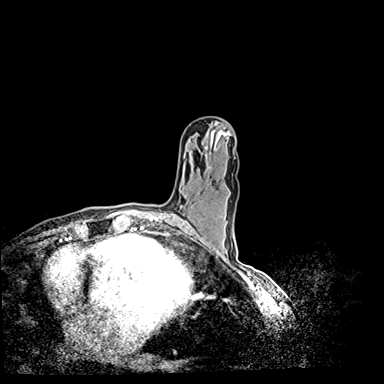
[im 96/144]
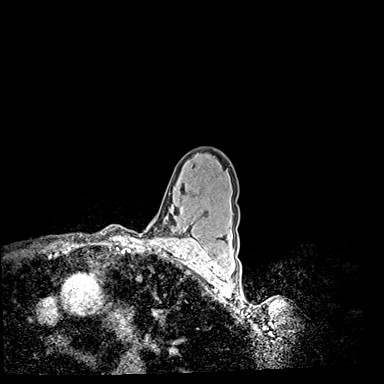
[im 144/144]
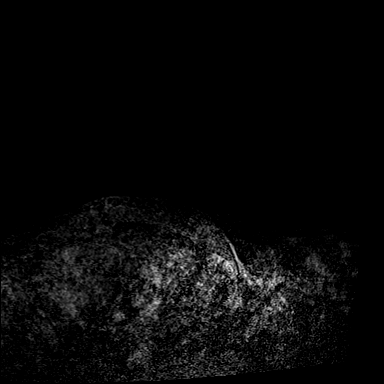

[Series 7: dynamic post 20 · axial · 1.3mm · 0.73mm/px · z∈[-97,+89]mm · 4 of 144 slices shown (2 of 2)]
[im 1/144]
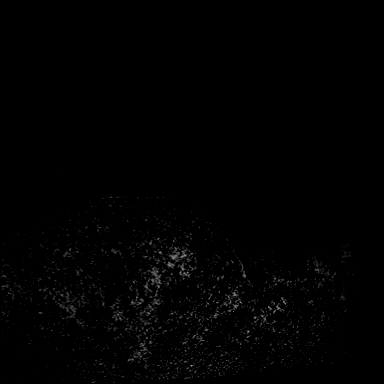
[im 48/144]
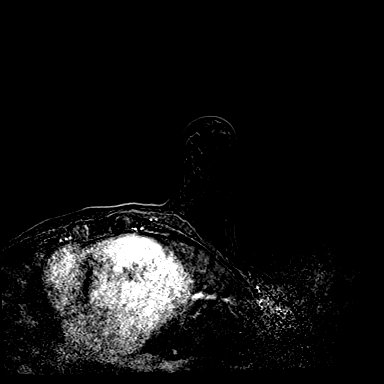
[im 96/144]
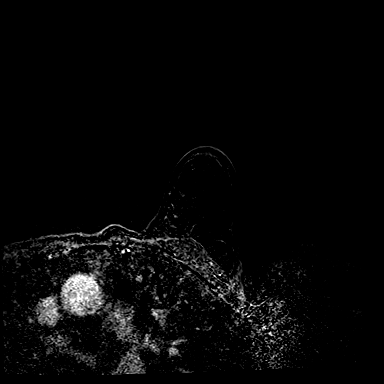
[im 144/144]
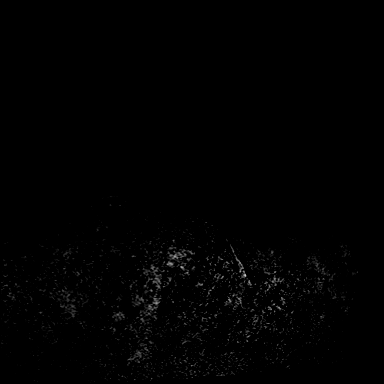

[Series 8: dynamic post 3 · axial · 1.3mm · 0.73mm/px · z∈[-97,+89]mm · 4 of 144 slices shown (1 of 2)]
[im 1/144]
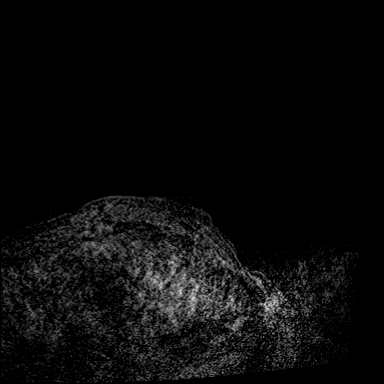
[im 48/144]
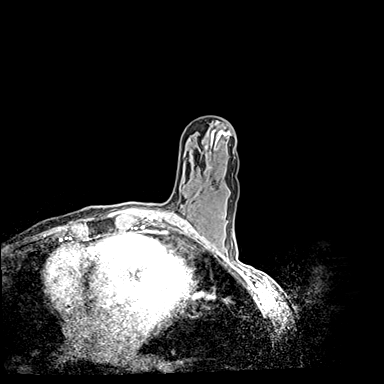
[im 96/144]
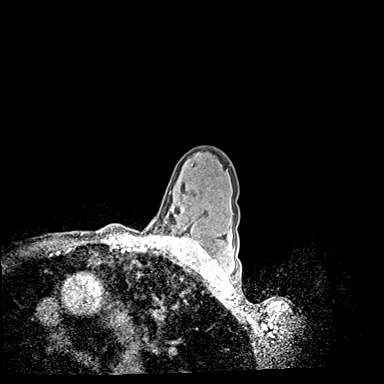
[im 144/144]
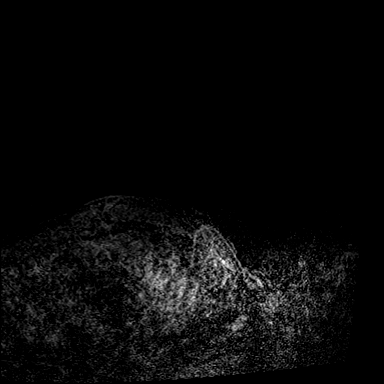

[Series 9: dynamic post 3 · axial · 1.3mm · 0.73mm/px · z∈[-97,+89]mm · 4 of 144 slices shown (2 of 2)]
[im 1/144]
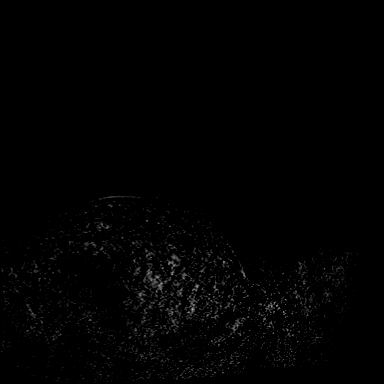
[im 48/144]
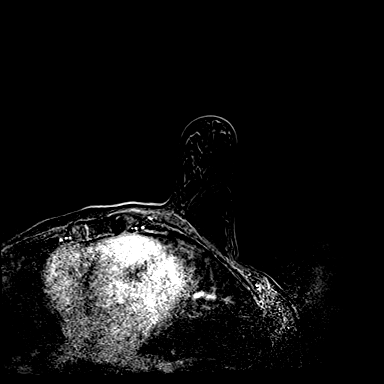
[im 96/144]
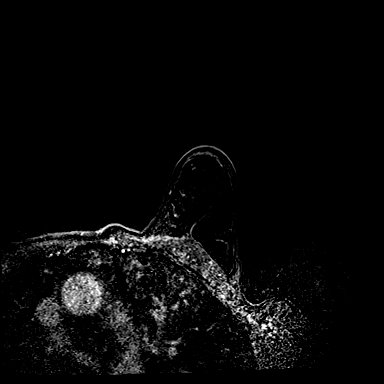
[im 144/144]
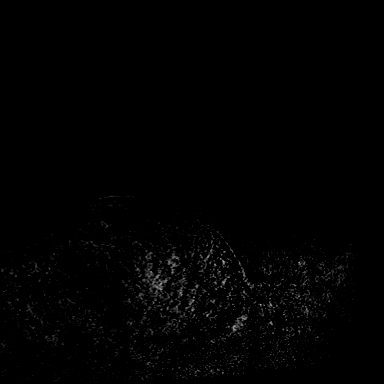

[Series 10: dynamic post 5 · axial · 1.3mm · 0.73mm/px · z∈[-97,+89]mm · 4 of 144 slices shown (1 of 2)]
[im 1/144]
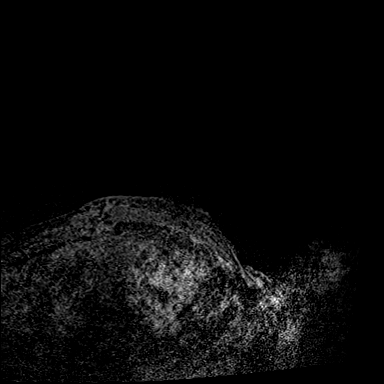
[im 48/144]
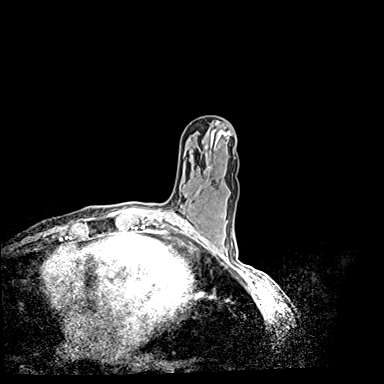
[im 96/144]
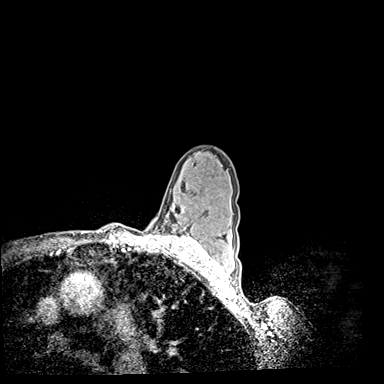
[im 144/144]
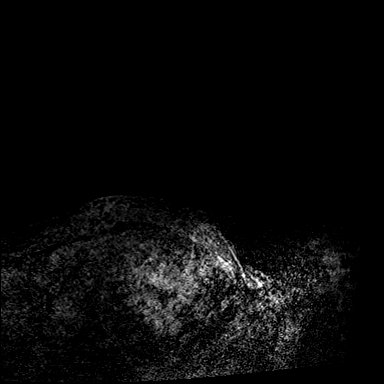

[Series 11: dynamic post 5 · axial · 1.3mm · 0.73mm/px · z∈[-97,+89]mm · 4 of 144 slices shown (2 of 2)]
[im 1/144]
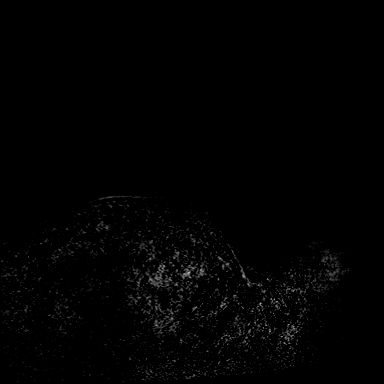
[im 48/144]
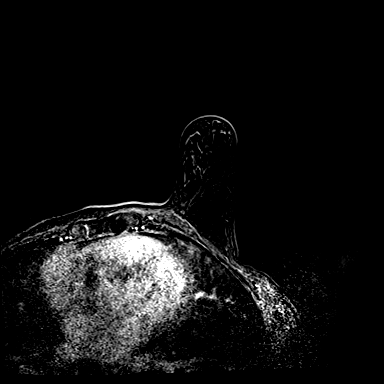
[im 96/144]
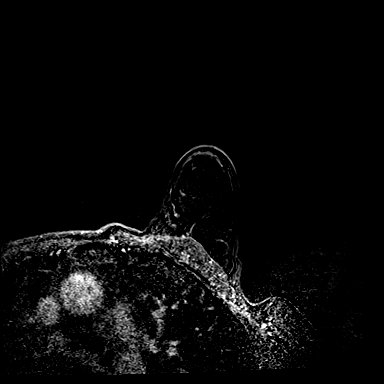
[im 144/144]
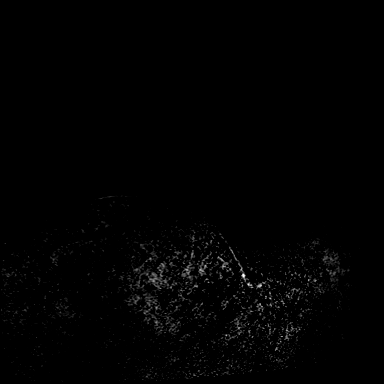

[Series 12: needle confirmation · axial · 1.3mm · 0.73mm/px · z∈[-97,+26]mm · 3 of 144 slices shown]
[im 1/144]
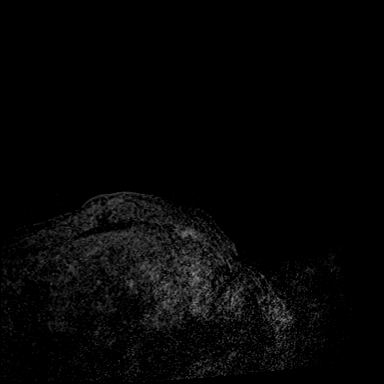
[im 48/144]
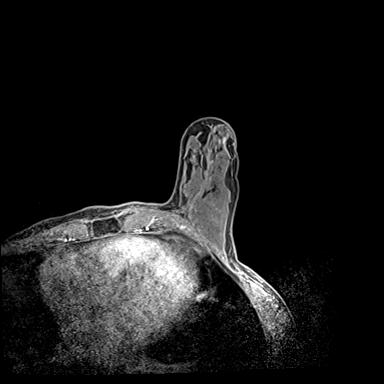
[im 96/144]
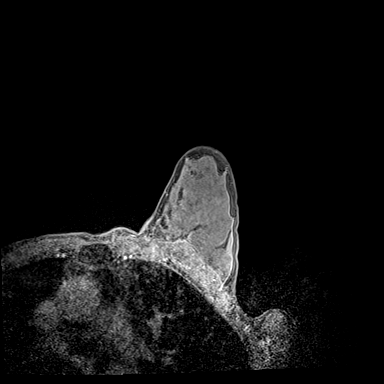

[35 of 48 positions shown; findings below may reference images not displayed]

FINDINGS: I met with the patient, and we discussed the procedure of MRI guided
biopsy, including risks, benefits, and alternatives. Specifically,
we discussed the risks of infection, bleeding, tissue injury, clip
migration, and inadequate sampling. Informed, written consent was
given. The usual time out protocol was performed immediately prior
to the procedure.

Using sterile technique, 1% Lidocaine, MRI guidance, and a 9 gauge
vacuum assisted device, biopsy was performed of non mass enhancement
in the UPPER central portion of the LEFT breast using a LATERAL to
MEDIAL approach. At the conclusion of the procedure, a barbell
shaped tissue marker clip was deployed into the biopsy cavity.
Follow-up 2-view mammogram was performed and dictated separately.
IMPRESSION: MRI guided biopsy of LEFT breast non mass enhancement. No apparent
complications.

## 2018-08-23 IMAGING — MG MM CLIP PLACEMENT
3 series · 3 of 3 positions shown · non-contrast
Comparison: Previous exam(s).

CLINICAL DATA: Evaluate clip placement

EXAM:
DIAGNOSTIC LEFT MAMMOGRAM POST MRI BIOPSY

[L ML]
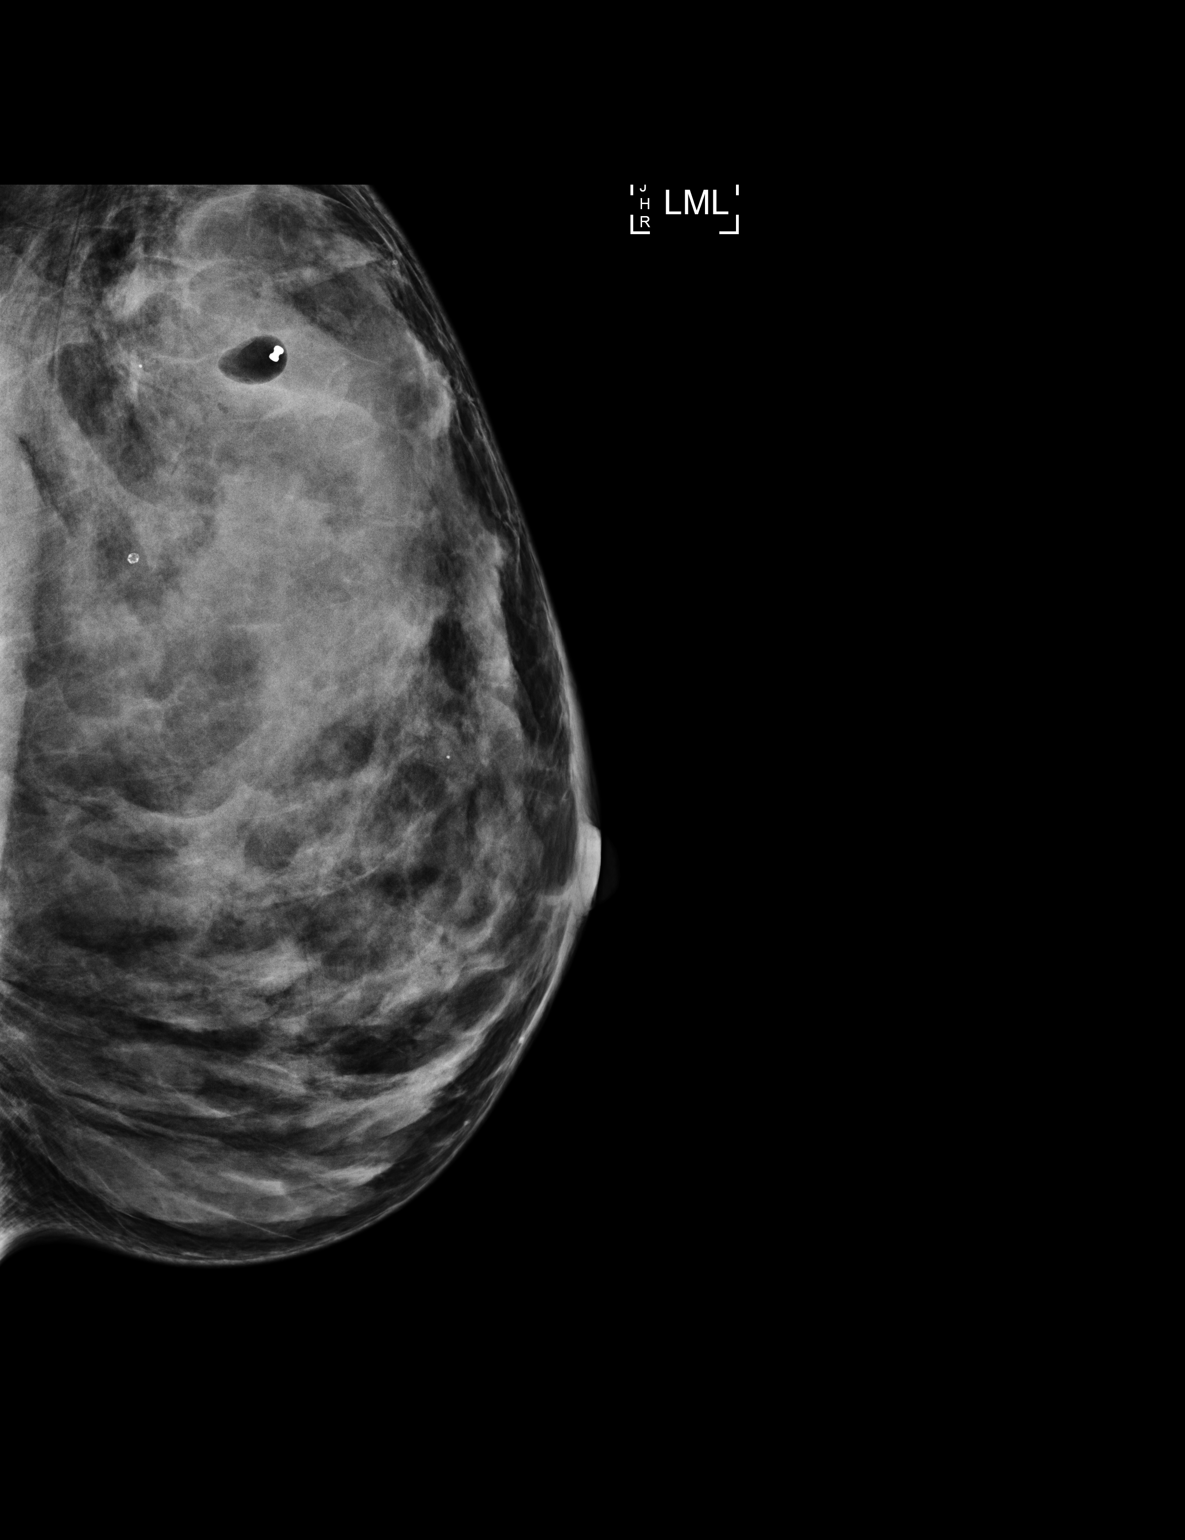

[L CC (1 of 2)]
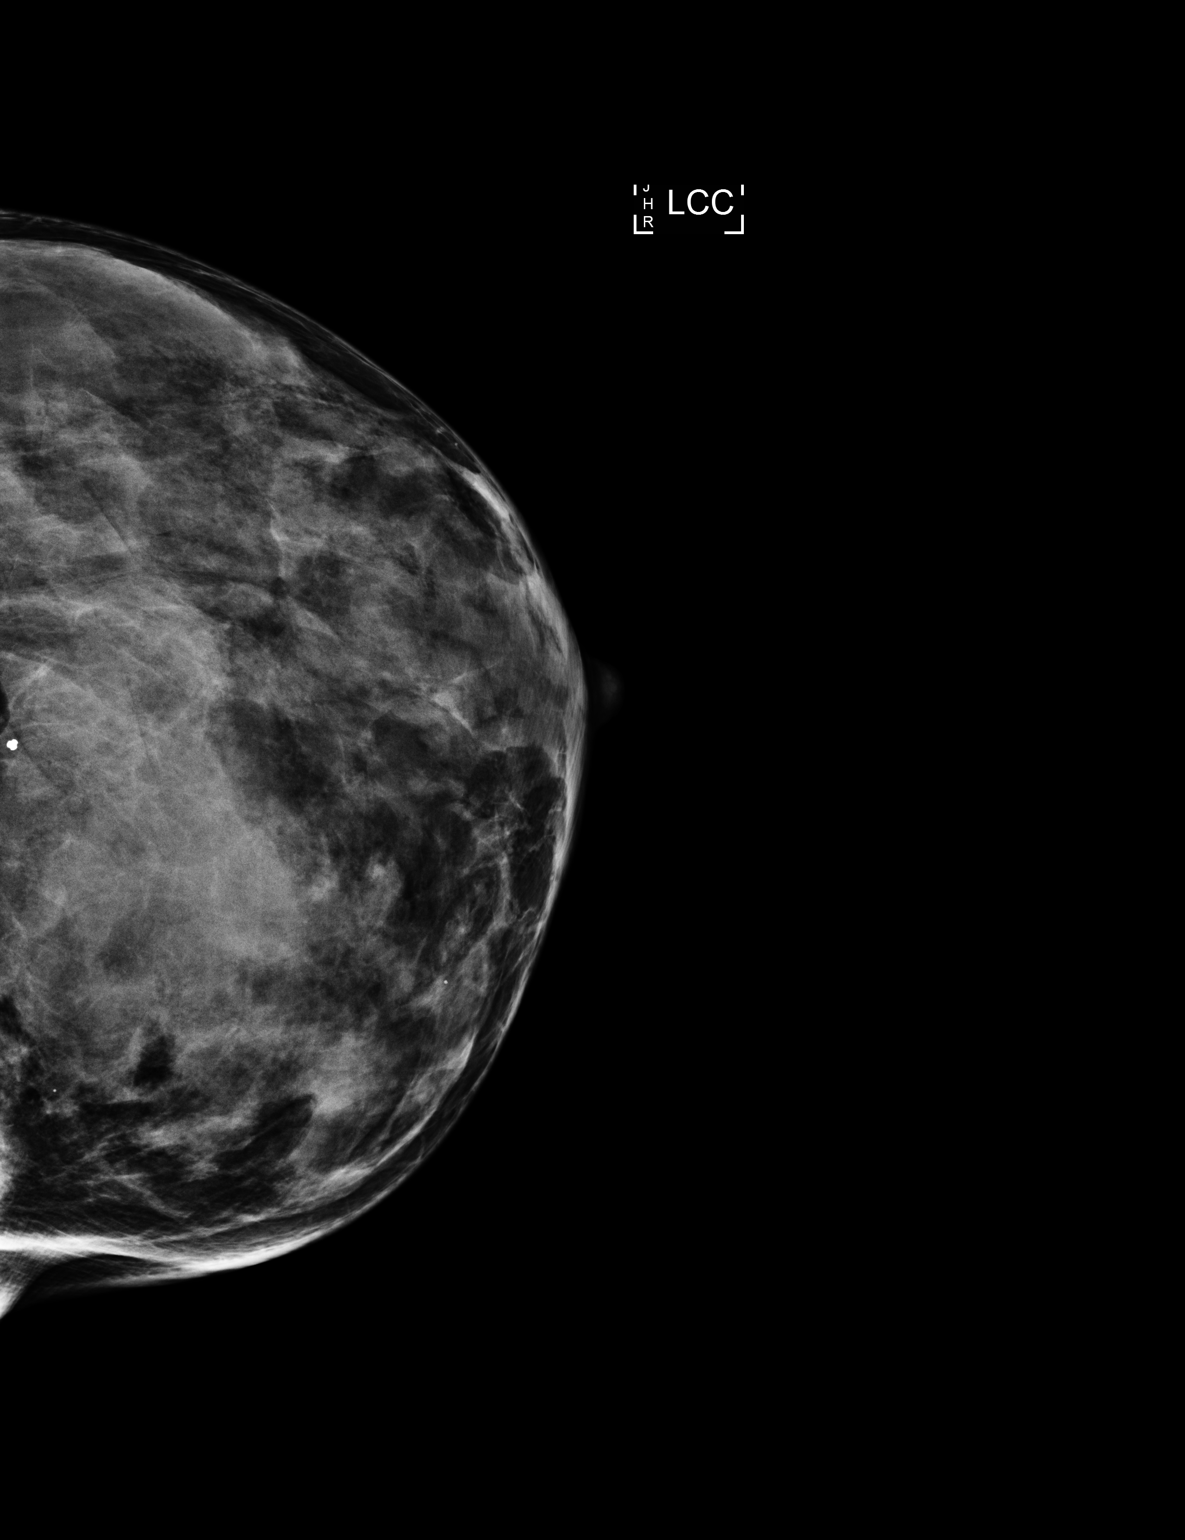

[L CC (2 of 2)]
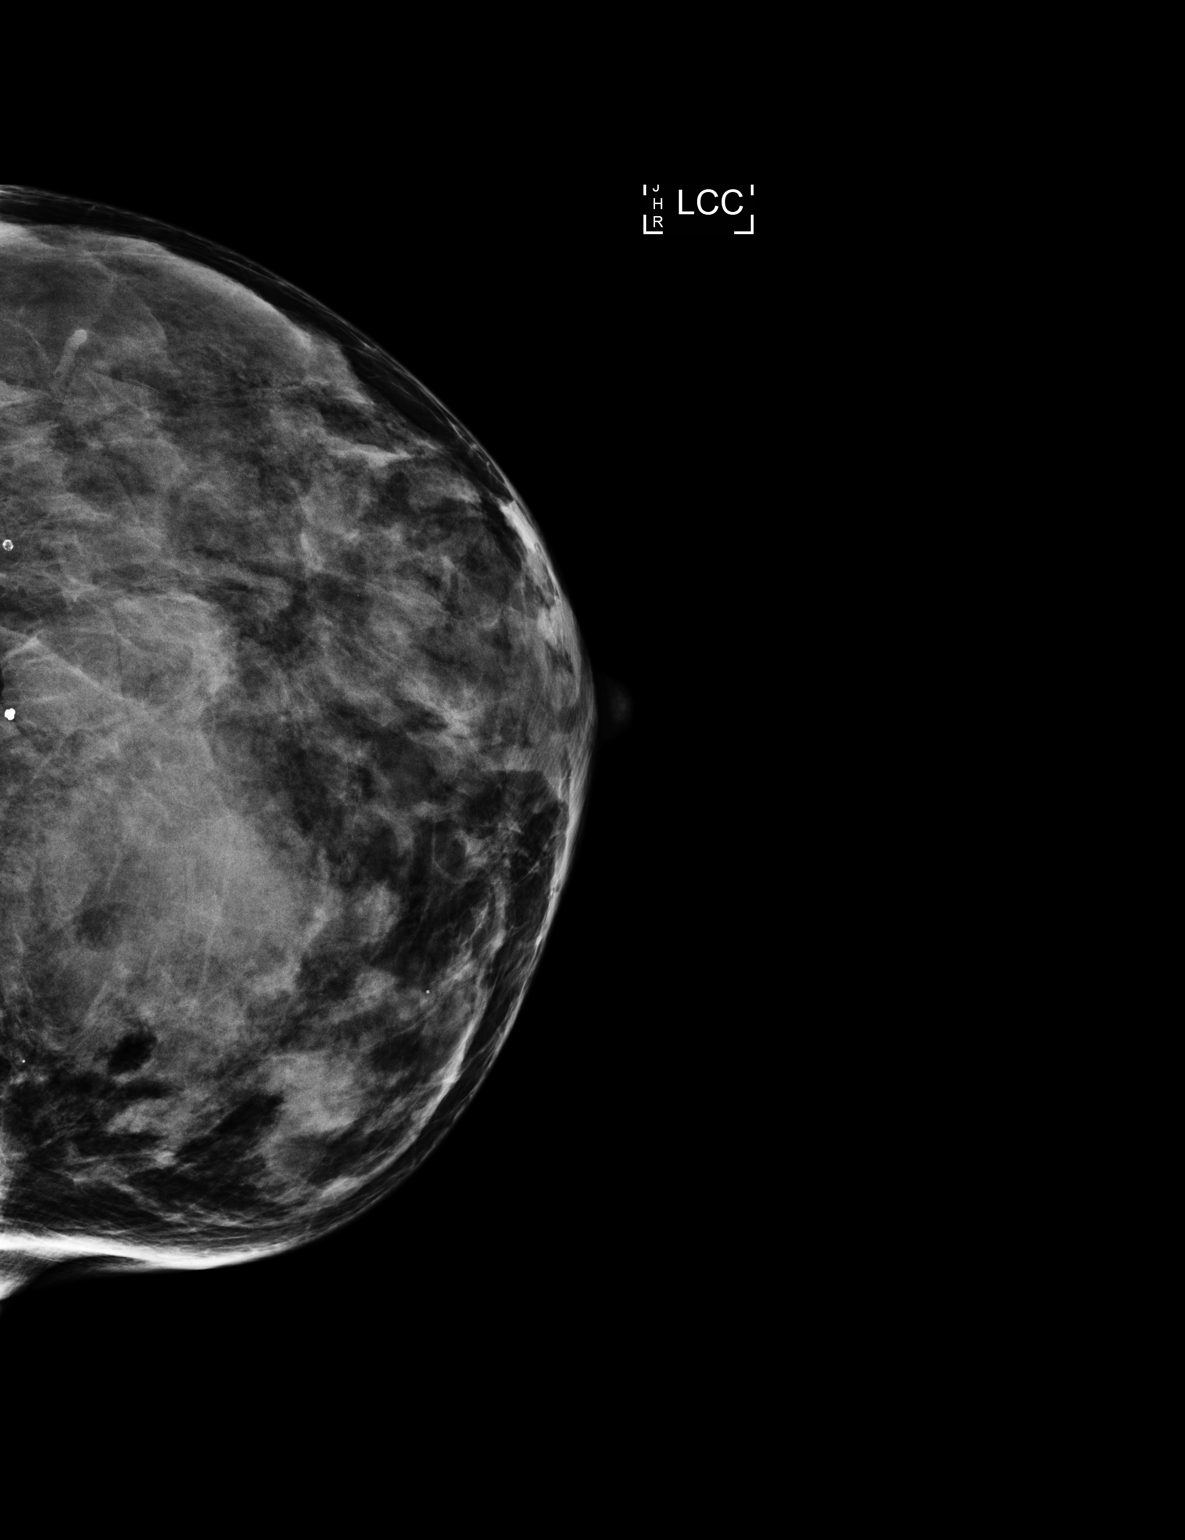

[3 of 3 positions shown; findings below may reference images not displayed]

FINDINGS: Mammographic images were obtained following MRI guided biopsy of a
left breast mass. The hour glass shaped clip is located
approximately 12 o'clock in the left breast at a posterior depth in
good position.
IMPRESSION: Clip placement as above.

Final Assessment: Post Procedure Mammograms for Marker Placement

## 2018-10-15 DIAGNOSIS — Z681 Body mass index (BMI) 19 or less, adult: Secondary | ICD-10-CM | POA: Diagnosis not present

## 2018-10-15 DIAGNOSIS — Z124 Encounter for screening for malignant neoplasm of cervix: Secondary | ICD-10-CM | POA: Diagnosis not present

## 2018-10-15 DIAGNOSIS — Z01419 Encounter for gynecological examination (general) (routine) without abnormal findings: Secondary | ICD-10-CM | POA: Diagnosis not present

## 2018-12-20 DIAGNOSIS — H2513 Age-related nuclear cataract, bilateral: Secondary | ICD-10-CM | POA: Diagnosis not present

## 2018-12-20 DIAGNOSIS — H2511 Age-related nuclear cataract, right eye: Secondary | ICD-10-CM | POA: Diagnosis not present

## 2018-12-20 DIAGNOSIS — H18413 Arcus senilis, bilateral: Secondary | ICD-10-CM | POA: Diagnosis not present

## 2018-12-20 DIAGNOSIS — H40013 Open angle with borderline findings, low risk, bilateral: Secondary | ICD-10-CM | POA: Diagnosis not present

## 2018-12-20 DIAGNOSIS — H25043 Posterior subcapsular polar age-related cataract, bilateral: Secondary | ICD-10-CM | POA: Diagnosis not present

## 2019-01-18 ENCOUNTER — Other Ambulatory Visit: Payer: Self-pay | Admitting: Family Medicine

## 2019-01-18 DIAGNOSIS — E038 Other specified hypothyroidism: Secondary | ICD-10-CM

## 2019-02-14 ENCOUNTER — Other Ambulatory Visit: Payer: Self-pay | Admitting: Family Medicine

## 2019-02-14 DIAGNOSIS — E038 Other specified hypothyroidism: Secondary | ICD-10-CM

## 2019-02-18 ENCOUNTER — Other Ambulatory Visit: Payer: Self-pay | Admitting: Family Medicine

## 2019-02-18 DIAGNOSIS — E038 Other specified hypothyroidism: Secondary | ICD-10-CM

## 2019-02-19 MED ORDER — LEVOTHYROXINE SODIUM 88 MCG PO TABS: 88.0000 ug | ORAL_TABLET | Freq: Every day | ORAL | 3 refills | Status: DC

## 2019-03-04 ENCOUNTER — Other Ambulatory Visit: Payer: Self-pay

## 2019-03-04 ENCOUNTER — Encounter: Payer: Self-pay | Admitting: Family Medicine

## 2019-03-04 ENCOUNTER — Ambulatory Visit (INDEPENDENT_AMBULATORY_CARE_PROVIDER_SITE_OTHER): Payer: BC Managed Care – PPO | Admitting: Family Medicine

## 2019-03-04 ENCOUNTER — Ambulatory Visit (HOSPITAL_BASED_OUTPATIENT_CLINIC_OR_DEPARTMENT_OTHER)
Admission: RE | Admit: 2019-03-04 | Discharge: 2019-03-04 | Disposition: A | Discharge status: Home / Self Care | Payer: BC Managed Care – PPO | Source: Ambulatory Visit | Attending: Family Medicine | Admitting: Family Medicine

## 2019-03-04 VITALS — BP 100/60 | HR 76 | Temp 97.9°F | Resp 18 | Ht 67.0 in | Wt 115.6 lb

## 2019-03-04 DIAGNOSIS — M25551 Pain in right hip: Secondary | ICD-10-CM | POA: Diagnosis not present

## 2019-03-04 DIAGNOSIS — M25559 Pain in unspecified hip: Secondary | ICD-10-CM | POA: Insufficient documentation

## 2019-03-04 DIAGNOSIS — I7300 Raynaud's syndrome without gangrene: Secondary | ICD-10-CM

## 2019-03-04 DIAGNOSIS — Z23 Encounter for immunization: Secondary | ICD-10-CM

## 2019-03-04 DIAGNOSIS — M5442 Lumbago with sciatica, left side: Secondary | ICD-10-CM | POA: Diagnosis not present

## 2019-03-04 DIAGNOSIS — Z0000 Encounter for general adult medical examination without abnormal findings: Secondary | ICD-10-CM

## 2019-03-04 DIAGNOSIS — Z0001 Encounter for general adult medical examination with abnormal findings: Secondary | ICD-10-CM | POA: Diagnosis not present

## 2019-03-04 DIAGNOSIS — G8929 Other chronic pain: Secondary | ICD-10-CM | POA: Diagnosis not present

## 2019-03-04 DIAGNOSIS — E039 Hypothyroidism, unspecified: Secondary | ICD-10-CM | POA: Diagnosis not present

## 2019-03-04 DIAGNOSIS — M25552 Pain in left hip: Secondary | ICD-10-CM | POA: Diagnosis not present

## 2019-03-04 DIAGNOSIS — M545 Low back pain: Secondary | ICD-10-CM | POA: Diagnosis not present

## 2019-03-04 LAB — CBC WITH DIFFERENTIAL/PLATELET
Basophils Absolute: 0 10*3/uL (ref 0.0–0.1)
Basophils Relative: 0.5 % (ref 0.0–3.0)
Eosinophils Absolute: 0.1 10*3/uL (ref 0.0–0.7)
Eosinophils Relative: 1.5 % (ref 0.0–5.0)
HCT: 37.9 % (ref 36.0–46.0)
Hemoglobin: 12.8 g/dL (ref 12.0–15.0)
Lymphocytes Relative: 27.5 % (ref 12.0–46.0)
Lymphs Abs: 1.4 10*3/uL (ref 0.7–4.0)
MCHC: 33.7 g/dL (ref 30.0–36.0)
MCV: 96.7 fl (ref 78.0–100.0)
Monocytes Absolute: 0.3 10*3/uL (ref 0.1–1.0)
Monocytes Relative: 6.9 % (ref 3.0–12.0)
Neutro Abs: 3.1 10*3/uL (ref 1.4–7.7)
Neutrophils Relative %: 63.6 % (ref 43.0–77.0)
Platelets: 206 10*3/uL (ref 150.0–400.0)
RBC: 3.92 Mil/uL (ref 3.87–5.11)
RDW: 12.4 % (ref 11.5–15.5)
WBC: 4.9 10*3/uL (ref 4.0–10.5)

## 2019-03-04 LAB — LIPID PANEL
Cholesterol: 166 mg/dL (ref 0–200)
HDL: 73.8 mg/dL (ref >39.00)
LDL Cholesterol: 77 mg/dL (ref 0–99)
NonHDL: 91.77
Total CHOL/HDL Ratio: 2
Triglycerides: 75 mg/dL (ref 0.0–149.0)
VLDL: 15 mg/dL (ref 0.0–40.0)

## 2019-03-04 LAB — COMPREHENSIVE METABOLIC PANEL
ALT: 42 U/L — ABNORMAL HIGH (ref 0–35)
AST: 29 U/L (ref 0–37)
Albumin: 4.2 g/dL (ref 3.5–5.2)
Alkaline Phosphatase: 57 U/L (ref 39–117)
BUN: 16 mg/dL (ref 6–23)
CO2: 30 mEq/L (ref 19–32)
Calcium: 9.7 mg/dL (ref 8.4–10.5)
Chloride: 104 mEq/L (ref 96–112)
Creatinine, Ser: 0.62 mg/dL (ref 0.40–1.20)
GFR: 97.8 mL/min (ref >60.00)
Glucose, Bld: 90 mg/dL (ref 70–99)
Potassium: 4.8 mEq/L (ref 3.5–5.1)
Sodium: 139 mEq/L (ref 135–145)
Total Bilirubin: 0.5 mg/dL (ref 0.2–1.2)
Total Protein: 6.5 g/dL (ref 6.0–8.3)

## 2019-03-04 LAB — TSH: TSH: 2.47 u[IU]/mL (ref 0.35–4.50)

## 2019-03-04 NOTE — Patient Instructions (Signed)

## 2019-03-04 NOTE — Progress Notes (Signed)
Subjective:     Kathryn Mcdaniel is a 64 y.o. female and is here for a comprehensive physical exam. The patient reports problems - low back pain across the entire low back -- radiates to L buttock --no known injury . Pt also needs new referral to rheum for her raynauds  Social History   Socioeconomic History  . Marital status: Single    Spouse name: Not on file  . Number of children: Not on file  . Years of education: Not on file  . Highest education level: Not on file  Occupational History  . Occupation: self employed  Tobacco Use  . Smoking status: Former Smoker    Quit date: 09/15/1984    Years since quitting: 34.4  . Smokeless tobacco: Never Used  Substance and Sexual Activity  . Alcohol use: Yes    Alcohol/week: 14.0 standard drinks    Types: 14 Cans of beer per week    Comment: daily  . Drug use: No  . Sexual activity: Not on file  Other Topics Concern  . Not on file  Social History Narrative   Single   Work; Therapist, occupational, has a dog walking business   Exercise-- swim, walking dogs and bike   Social Determinants of Radio broadcast assistant Strain:   . Difficulty of Paying Living Expenses: Not on file  Food Insecurity:   . Worried About Charity fundraiser in the Last Year: Not on file  . Ran Out of Food in the Last Year: Not on file  Transportation Needs:   . Lack of Transportation (Medical): Not on file  . Lack of Transportation (Non-Medical): Not on file  Physical Activity:   . Days of Exercise per Week: Not on file  . Minutes of Exercise per Session: Not on file  Stress:   . Feeling of Stress : Not on file  Social Connections:   . Frequency of Communication with Friends and Family: Not on file  . Frequency of Social Gatherings with Friends and Family: Not on file  . Attends Religious Services: Not on file  . Active Member of Clubs or Organizations: Not on file  . Attends Archivist Meetings: Not on file  . Marital Status: Not on file   Intimate Partner Violence:   . Fear of Current or Ex-Partner: Not on file  . Emotionally Abused: Not on file  . Physically Abused: Not on file  . Sexually Abused: Not on file   Health Maintenance  Topic Date Due  . HIV Screening  12/22/1972  . TETANUS/TDAP  04/22/2016  . PAP SMEAR-Modifier  09/20/2019  . COLONOSCOPY  01/22/2020  . MAMMOGRAM  07/31/2020  . INFLUENZA VACCINE  Completed  . 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 She does not have any pertinent problems on file. She  has a past surgical history that includes Breast surgery; Breast biopsy; Nasal sinus surgery; Mass excision (Right, 08/09/2013); and Mastectomy (Right). Her family history includes Breast cancer in her mother and paternal grandmother; Cancer in her brother, father, and another  family member; Diabetes in her father; Hypertension in her father; Squamous cell carcinoma in her mother; Stroke in her maternal grandmother; Transient ischemic attack in her mother. She  reports that she quit smoking about 34 years ago. She has never used smokeless tobacco. She reports current alcohol use of about 14.0 standard drinks of alcohol per week. She reports that she does not use drugs. She has a current medication list which includes the following prescription(s): alprazolam, vitamin c, calcium carbonate-vitamin d, cholecalciferol, fluoxetine, levothyroxine, multivitamin, and vitamin e. Current Outpatient  Medications on File Prior to Visit  Medication Sig Dispense Refill  . ALPRAZolam (XANAX) 0.5 MG tablet Take 1 tablet (0.5 mg total) by mouth at bedtime as needed. 30 tablet 0  . Ascorbic Acid (VITAMIN C) 1000 MG tablet Take 1,000 mg by mouth daily.    . Calcium Carbonate-Vitamin D (CALTRATE 600+D) 600-400 MG-UNIT per tablet Take 1 tablet by mouth 2 (two) times daily.    . cholecalciferol (VITAMIN D) 1000 UNITS tablet Take 1,000 Units by mouth daily.    Marland Kitchen FLUoxetine (PROZAC) 40 MG capsule Take 40 mg by mouth 2 (two) times daily.    Marland Kitchen levothyroxine (SYNTHROID) 88 MCG tablet Take 1 tablet (88 mcg total) by mouth daily before breakfast. 30 tablet 3  . Multiple Vitamin (MULTIVITAMIN) tablet Take 1 tablet by mouth daily.    . vitamin E 400 UNIT capsule Take 400 Units by mouth daily.     No current facility-administered medications on file prior to visit.   She is allergic to compazine [prochlorperazine edisylate] and metoclopramide hcl..  Review of Systems Review of Systems  Constitutional: Negative for activity change, appetite change and fatigue.  HENT: Negative for hearing loss, congestion, tinnitus and ear discharge.  dentist q107m Eyes: Negative for visual disturbance (see optho q1y -- vision corrected to 20/20 with glasses).  Respiratory: Negative for cough, chest tightness and shortness of breath.   Cardiovascular: Negative for chest pain, palpitations and leg swelling.  Gastrointestinal: Negative for abdominal pain, diarrhea, constipation and abdominal distention.  Genitourinary: Negative for urgency, frequency, decreased urine volume and difficulty urinating.  Musculoskeletal: Negative for back pain, arthralgias and gait problem.  Skin: Negative for color change, pallor and rash.  Neurological: Negative for dizziness, light-headedness, numbness and headaches.  Hematological: Negative for adenopathy. Does not bruise/bleed easily.  Psychiatric/Behavioral: Negative for suicidal ideas,  confusion, sleep disturbance, self-injury, dysphoric mood, decreased concentration and agitation.       Objective:    BP 100/60 (BP Location: Left Arm, Patient Position: Sitting, Cuff Size: Normal)   Pulse 76   Temp 97.9 F (36.6 C) (Temporal)   Resp 18   Ht 5\' 7"  (1.702 m)   Wt 115 lb 9.6 oz (52.4 kg)   SpO2 97%   BMI 18.11 kg/m  General appearance: alert, cooperative, appears stated age and no distress Head: Normocephalic, without obvious abnormality, atraumatic Eyes: negative findings: lids and lashes normal, conjunctivae and sclerae normal and pupils equal, round, reactive to light and accomodation Ears: normal TM's and external ear canals both ears Back: symmetric, no curvature. ROM normal. No CVA tenderness. Lungs: clear to auscultation bilaterally  Heart: regular rate and rhythm, S1, S2 normal, no murmur, click, rub or gallop Abdomen: soft, non-tender; bowel sounds normal; no masses,  no organomegaly Pelvic: deferred--gyn Extremities: extremities normal, atraumatic, no cyanosis or edema Pulses: 2+ and symmetric Skin: Skin color, texture, turgor normal. No rashes or lesions Lymph nodes: Cervical, supraclavicular, and axillary nodes normal. Neurologic: Alert  and oriented X 3, normal strength and tone. Normal symmetric reflexes. Normal coordination and gait    Assessment:    Healthy female exam.      Plan:     ghmutd Check labs  See After Visit Summary for Counseling Recommendations    1. Preventative health care See above  - TSH - Lipid panel - CBC with Differential - Comprehensive metabolic panel  2. Hypothyroidism, unspecified type Check labs  con't meds  - TSH  3. Need for Tdap vaccination  - Tdap vaccine  4. Chronic bilateral low back pain with left-sided sciatica Check xray -- consider pt vs ortho  - DG Lumbar Spine Complete; Future  5. Hip pain Check xray  Tylenol arthritis - DG HIPS BILAT W OR W/O PELVIS 2V; Future - DG Lumbar Spine  Complete; Future  6. Raynaud's disease without gangrene  - Ambulatory referral to Rheumatology

## 2019-03-12 DIAGNOSIS — I7300 Raynaud's syndrome without gangrene: Secondary | ICD-10-CM | POA: Diagnosis not present

## 2019-04-03 DIAGNOSIS — H4312 Vitreous hemorrhage, left eye: Secondary | ICD-10-CM | POA: Diagnosis not present

## 2019-04-03 DIAGNOSIS — H35373 Puckering of macula, bilateral: Secondary | ICD-10-CM | POA: Diagnosis not present

## 2019-04-03 DIAGNOSIS — H43813 Vitreous degeneration, bilateral: Secondary | ICD-10-CM | POA: Diagnosis not present

## 2019-04-03 DIAGNOSIS — H33312 Horseshoe tear of retina without detachment, left eye: Secondary | ICD-10-CM | POA: Diagnosis not present

## 2019-04-24 DIAGNOSIS — H33312 Horseshoe tear of retina without detachment, left eye: Secondary | ICD-10-CM | POA: Diagnosis not present

## 2019-05-30 ENCOUNTER — Other Ambulatory Visit: Payer: Self-pay

## 2019-06-03 ENCOUNTER — Other Ambulatory Visit: Payer: Self-pay

## 2019-06-03 ENCOUNTER — Encounter: Payer: Self-pay | Admitting: Family Medicine

## 2019-06-03 ENCOUNTER — Ambulatory Visit (INDEPENDENT_AMBULATORY_CARE_PROVIDER_SITE_OTHER): Payer: BC Managed Care – PPO | Admitting: Family Medicine

## 2019-06-03 VITALS — BP 98/60 | HR 64 | Temp 97.4°F | Resp 18 | Ht 67.0 in | Wt 114.0 lb

## 2019-06-03 DIAGNOSIS — M79604 Pain in right leg: Secondary | ICD-10-CM

## 2019-06-03 DIAGNOSIS — D691 Qualitative platelet defects: Secondary | ICD-10-CM | POA: Diagnosis not present

## 2019-06-03 DIAGNOSIS — R0989 Other specified symptoms and signs involving the circulatory and respiratory systems: Secondary | ICD-10-CM | POA: Diagnosis not present

## 2019-06-03 DIAGNOSIS — M79605 Pain in left leg: Secondary | ICD-10-CM

## 2019-06-03 DIAGNOSIS — I83813 Varicose veins of bilateral lower extremities with pain: Secondary | ICD-10-CM | POA: Diagnosis not present

## 2019-06-03 MED ORDER — NONFORMULARY OR COMPOUNDED ITEM: 1 refills | Status: DC

## 2019-06-03 NOTE — Progress Notes (Signed)
Patient ID: Kathryn Mcdaniel, female    DOB: Sep 24, 1955  Age: 64 y.o. MRN: [id number]    Subjective:  Subjective  HPI Kathryn Mcdaniel presents for 1 month hx aching legs knee down b/L   Nothing makes it worse  Swimming helps Feet turn red when she is hot  She does have varicose veins in her legs-- -L > R that cause pain Pt also con't to c/o hearing her pulse in her ears----no headaches or other complaints  Review of Systems  Constitutional: Negative for appetite change, diaphoresis, fatigue and unexpected weight change.  Eyes: Negative for pain, redness and visual disturbance.  Respiratory: Negative for cough, chest tightness, shortness of breath and wheezing.   Cardiovascular: Negative for chest pain, palpitations and leg swelling.  Endocrine: Negative for cold intolerance, heat intolerance, polydipsia, polyphagia and polyuria.  Genitourinary: Negative for difficulty urinating, dysuria and frequency.  Musculoskeletal: Positive for gait problem and myalgias.  Neurological: Negative for dizziness, light-headedness, numbness and headaches.    History Past Medical History:  Diagnosis Date  . Abnormal breast biopsy   . Basal cell carcinoma of skin    left temple  . Breast cancer ([place])   . Depression   . 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 Personal history of cervical dysplasia   . Personal history of chemotherapy   . Personal history of radiation therapy   . Thyroid disease   . Unspecified personal history presenting hazards to health    Radiation Therapy    She has a past surgical history that includes Breast surgery; Breast biopsy; Nasal sinus surgery; Mass excision (Right, 08/09/2013); and Mastectomy (Right).   Her family history includes Breast cancer in her mother and paternal grandmother; Cancer in her brother, father, and another family  member; Diabetes in her father; Hypertension in her father; Leukemia in her sister; Squamous cell carcinoma in her mother; Stroke in her maternal grandmother; Transient ischemic attack in her mother.She reports that she quit smoking about 34 years ago. She has never used smokeless tobacco. She reports current alcohol use of about 14.0 standard drinks of alcohol per week. She reports that she does not use drugs.  Current Outpatient Medications on File Prior to Visit  Medication Sig Dispense Refill  . ALPRAZolam (XANAX) 0.5 MG tablet Take 1 tablet (0.5 mg total) by mouth at bedtime as needed. 30 tablet 0  . Ascorbic Acid (VITAMIN C) 1000 MG tablet Take 1,000 mg by mouth daily.    . Calcium Carbonate-Vitamin D (CALTRATE 600+D) 600-400 MG-UNIT per tablet Take 1 tablet by mouth 2 (two) times daily.    . cholecalciferol (VITAMIN D) 1000 UNITS tablet Take 1,000 Units by mouth daily.    Marland Kitchen FLUoxetine (PROZAC) 40 MG capsule Take 40 mg by mouth 2 (two) times daily.    Marland Kitchen levothyroxine (SYNTHROID) 88 MCG tablet Take 1 tablet (88 mcg total) by mouth daily before breakfast. 30 tablet 3  . Multiple Vitamin (MULTIVITAMIN) tablet Take 1 tablet by mouth daily.    . vitamin E 400 UNIT capsule Take 400 Units by mouth daily.     No current facility-administered medications on file prior to visit.     Objective:  Objective  Physical Exam Vitals and nursing note reviewed.  Constitutional:      Appearance: She is well-developed.  HENT:     Head: Normocephalic and atraumatic.  Eyes:  Conjunctiva/sclera: Conjunctivae normal.  Neck:     Thyroid: No thyromegaly.     Vascular: No carotid bruit or JVD.  Cardiovascular:     Rate and Rhythm: Normal rate and regular rhythm.     Heart sounds: Normal heart sounds. No murmur.  Pulmonary:     Effort: Pulmonary effort is normal. No respiratory distress.     Breath sounds: Normal breath sounds. No wheezing or rales.  Chest:     Chest wall: No tenderness.    Musculoskeletal:        General: Tenderness present.     Cervical back: Normal range of motion and neck supple.       Legs:  Neurological:     Mental Status: She is alert and oriented to person, place, and time.    BP 98/60 (BP Location: Left Arm, Patient Position: Sitting, Cuff Size: Normal)   Pulse 64   Temp (!) 97.4 F (36.3 C) (Temporal)   Resp 18   Ht 5\' 7"  (1.702 m)   Wt 114 lb (51.7 kg)   BMI 17.85 kg/m  Wt Readings from Last 3 Encounters:  06/03/19 114 lb (51.7 kg)  03/04/19 115 lb 9.6 oz (52.4 kg)  05/01/18 116 lb (52.6 kg)     Lab Results  Component Value Date   WBC 4.9 03/04/2019   HGB 12.8 03/04/2019   HCT 37.9 03/04/2019   PLT 206.0 03/04/2019   GLUCOSE 90 03/04/2019   CHOL 166 03/04/2019   TRIG 75.0 03/04/2019   HDL 73.80 03/04/2019   LDLCALC 77 03/04/2019   ALT 42 (H) 03/04/2019   AST 29 03/04/2019   NA 139 03/04/2019   K 4.8 03/04/2019   CL 104 03/04/2019   CREATININE 0.62 03/04/2019   BUN 16 03/04/2019   CO2 30 03/04/2019   TSH 2.47 03/04/2019   INR 1.2 01/02/2006    DG Lumbar Spine Complete  Result Date: 03/04/2019 CLINICAL DATA:  64 year old female with back pain. No known injury pain EXAM: LUMBAR SPINE - COMPLETE 4+ VIEW COMPARISON:  Lumbar spine radiograph dated 10/26/2016 FINDINGS: Five lumbar type vertebra. There is no acute fracture or subluxation of the lumbar spine. Mild levoscoliosis similar to prior radiograph and center at L3. The vertebral body heights are maintained. Minimal degenerative changes at L4-L5 and L5-S1. Mild lower lumbar facet arthropathy. The neural foramina appear patent as visualized. The visualized posterior elements are intact. The soft tissues are unremarkable. IMPRESSION: 1. No acute/traumatic lumbar spine pathology. 2. Mild lower lumbar degenerative changes. Electronically Signed   By: Anner Crete M.D.   On: 03/04/2019 16:30   DG HIPS BILAT W OR W/O PELVIS 2V  Result Date: 03/04/2019 CLINICAL DATA:   Bilateral hip pain EXAM: DG HIP (WITH OR WITHOUT PELVIS) 2V BILAT COMPARISON:  None. FINDINGS: There is no evidence of hip fracture or dislocation. There is no evidence of arthropathy or other focal bone abnormality. Hip joints and SI joints are symmetric and unremarkable. IMPRESSION: Negative. Electronically Signed   By: Rolm Baptise M.D.   On: 03/04/2019 16:39     Assessment & Plan:  Plan  I am having [name] start on NONFORMULARY OR COMPOUNDED ITEM. I am also having her maintain her multivitamin, vitamin C, vitamin E, Calcium Carbonate-Vitamin D, ALPRAZolam, cholecalciferol, FLUoxetine, and levothyroxine.  Meds ordered this encounter  Medications  . NONFORMULARY OR COMPOUNDED ITEM    Sig: Compression socks #1  As directed    Dispense:  1 each    Refill:  1    Problem List Items Addressed This Visit      Unprioritized   Abnormal carotid pulse    Pt able to hear pulse in her ears Check carotid US No headaches Consider MRI brain      Relevant Orders   VAS US CAROTID   Bilateral leg pain    ? Due to varicosities---- compression socks Elevate leg s Check art doppler  Consider vascular referral      Relevant Orders   VAS [place] ABI WITH/WO TBI   Varicose veins of bilateral lower extremities with pain - Primary   Relevant Medications   NONFORMULARY OR COMPOUNDED ITEM    Other Visit Diagnoses    Abnormal platelets ([place])          Follow-up: Return if symptoms worsen or fail to improve.  [name], DO

## 2019-06-03 NOTE — Patient Instructions (Signed)
Varicose Veins Varicose veins are veins that have become enlarged, bulged, and twisted. They most often appear in the legs. What are the causes? This condition is caused by damage to the valves in the vein. These valves help blood return to your heart. When they are damaged and they stop working properly, blood may flow backward and back up in the veins near the skin, causing the veins to get larger and appear twisted. The condition can result from any issue that causes blood to back up, like pregnancy, prolonged standing, or obesity. What increases the risk? This condition is more likely to develop in people who are:  On their feet a lot.  Pregnant.  Overweight. What are the signs or symptoms? Symptoms of this condition include:  Bulging, twisted, and bluish veins.  A feeling of heaviness. This may be worse at the end of the day.  Leg pain. This may be worse at the end of the day.  Swelling in the leg.  Changes in skin color over the veins. How is this diagnosed? This condition may be diagnosed based on your symptoms, a physical exam, and an ultrasound test. How is this treated? Treatment for this condition may involve:  Avoiding sitting or standing in one position for long periods of time.  Wearing compression stockings. These stockings help to prevent blood clots and reduce swelling in the legs.  Raising (elevating) the legs when resting.  Losing weight.  Exercising regularly. If you have persistent symptoms or want to improve the way your varicose veins look, you may choose to have a procedure to close the varicose veins off or to remove them. Treatments to close off the veins include:  Sclerotherapy. In this treatment, a solution is injected into a vein to close it off.  Laser treatment. In this treatment, the vein is heated with a laser to close it off.  Radiofrequency vein ablation. In this treatment, an electrical current produced by radio waves is used to close  off the vein. Treatments to remove the veins include:  Phlebectomy. In this treatment, the veins are removed through small incisions made over the veins.  Vein ligation and stripping. In this treatment, incisions are made over the veins. The veins are then removed after being tied (ligated) with stitches (sutures). Follow these instructions at home: Activity  Walk as much as possible. Walking increases blood flow. This helps blood return to the heart and takes pressure off your veins. It also increases your cardiovascular strength.  Follow your health care provider's instructions about exercising.  Do not stand or sit in one position for a long period of time.  Do not sit with your legs crossed.  Rest with your legs raised during the day. General instructions   Follow any diet instructions given to you by your health care provider.  Wear compression stockings as directed by your health care provider. Do not wear other kinds of tight clothing around your legs, pelvis, or waist.  Elevate your legs at night to above the level of your heart.  If you get a cut in the skin over the varicose vein and the vein bleeds: ? Lie down with your leg raised. ? Apply firm pressure to the cut with a clean cloth until the bleeding stops. ? Place a bandage (dressing) on the cut. Contact a health care provider if:  The skin around your varicose veins starts to break down.  You have pain, redness, tenderness, or hard swelling over a vein.  You   are uncomfortable because of pain.  You get a cut in the skin over a varicose vein and it will not stop bleeding. Summary  Varicose veins are veins that have become enlarged, bulged, and twisted. They most often appear in the legs.  This condition is caused by damage to the valves in the vein. These valves help blood return to your heart.  Treatment for this condition includes frequent movements, wearing compression stockings, losing weight, and  exercising regularly. In some cases, procedures are done to close off or remove the veins.  Treatment for this condition may include wearing compression stockings, elevating the legs, losing weight, and engaging in regular activity. In some cases, procedures are done to close off or remove the veins. This information is not intended to replace advice given to you by your health care provider. Make sure you discuss any questions you have with your health care provider. Document Revised: 04/05/2018 Document Reviewed: 03/02/2016 Elsevier Patient Education  2020 [employer]  

## 2019-06-04 DIAGNOSIS — M79604 Pain in right leg: Secondary | ICD-10-CM | POA: Insufficient documentation

## 2019-06-04 DIAGNOSIS — I83813 Varicose veins of bilateral lower extremities with pain: Secondary | ICD-10-CM | POA: Insufficient documentation

## 2019-06-04 DIAGNOSIS — R0989 Other specified symptoms and signs involving the circulatory and respiratory systems: Secondary | ICD-10-CM | POA: Insufficient documentation

## 2019-06-04 NOTE — Assessment & Plan Note (Signed)
Pt able to hear pulse in her ears Check carotid US No headaches Consider MRI brain

## 2019-06-04 NOTE — Assessment & Plan Note (Signed)
?   Due to varicosities---- compression socks Elevate leg s Check art doppler  Consider vascular referral

## 2019-06-05 ENCOUNTER — Encounter (HOSPITAL_BASED_OUTPATIENT_CLINIC_OR_DEPARTMENT_OTHER): Payer: BC Managed Care – PPO

## 2019-06-06 ENCOUNTER — Ambulatory Visit (HOSPITAL_BASED_OUTPATIENT_CLINIC_OR_DEPARTMENT_OTHER)
Admission: RE | Admit: 2019-06-06 | Discharge: 2019-06-06 | Disposition: A | Discharge status: Home / Self Care | Payer: BC Managed Care – PPO | Source: Ambulatory Visit | Attending: Family Medicine | Admitting: Family Medicine

## 2019-06-06 ENCOUNTER — Other Ambulatory Visit: Payer: Self-pay

## 2019-06-06 DIAGNOSIS — R0989 Other specified symptoms and signs involving the circulatory and respiratory systems: Secondary | ICD-10-CM | POA: Insufficient documentation

## 2019-06-06 DIAGNOSIS — M79605 Pain in left leg: Secondary | ICD-10-CM | POA: Insufficient documentation

## 2019-06-06 DIAGNOSIS — M79604 Pain in right leg: Secondary | ICD-10-CM

## 2019-06-06 NOTE — Progress Notes (Signed)
  Echocardiogram 2D Echocardiogram has been performed.  Kathryn Mcdaniel 06/06/2019, 2:45 PM

## 2019-06-06 NOTE — Progress Notes (Signed)
ABI Doppler Performed  Cardell Peach 06/06/19

## 2019-06-10 ENCOUNTER — Telehealth: Payer: Self-pay | Admitting: Family Medicine

## 2019-06-10 NOTE — Telephone Encounter (Signed)
Pt advised imaging results

## 2019-06-10 NOTE — Telephone Encounter (Signed)
Patient is calling in regards to results form testing.

## 2019-06-12 ENCOUNTER — Encounter: Payer: Self-pay | Admitting: Family Medicine

## 2019-06-12 DIAGNOSIS — R0989 Other specified symptoms and signs involving the circulatory and respiratory systems: Secondary | ICD-10-CM

## 2019-06-12 DIAGNOSIS — I83813 Varicose veins of bilateral lower extremities with pain: Secondary | ICD-10-CM

## 2019-06-12 DIAGNOSIS — I6523 Occlusion and stenosis of bilateral carotid arteries: Secondary | ICD-10-CM

## 2019-06-13 ENCOUNTER — Other Ambulatory Visit: Payer: Self-pay | Admitting: Family Medicine

## 2019-06-13 DIAGNOSIS — E038 Other specified hypothyroidism: Secondary | ICD-10-CM

## 2019-06-13 DIAGNOSIS — I6529 Occlusion and stenosis of unspecified carotid artery: Secondary | ICD-10-CM

## 2019-06-13 NOTE — Telephone Encounter (Signed)
Referral in

## 2019-06-17 ENCOUNTER — Other Ambulatory Visit: Payer: Self-pay

## 2019-06-17 ENCOUNTER — Encounter: Payer: Self-pay | Admitting: Cardiology

## 2019-06-17 ENCOUNTER — Ambulatory Visit (INDEPENDENT_AMBULATORY_CARE_PROVIDER_SITE_OTHER): Payer: BC Managed Care – PPO | Admitting: Cardiology

## 2019-06-17 VITALS — BP 110/70 | HR 71 | Ht 67.0 in | Wt 114.0 lb

## 2019-06-17 DIAGNOSIS — Z9221 Personal history of antineoplastic chemotherapy: Secondary | ICD-10-CM | POA: Diagnosis not present

## 2019-06-17 DIAGNOSIS — R6889 Other general symptoms and signs: Secondary | ICD-10-CM | POA: Diagnosis not present

## 2019-06-17 DIAGNOSIS — Z923 Personal history of irradiation: Secondary | ICD-10-CM | POA: Insufficient documentation

## 2019-06-17 DIAGNOSIS — Z8679 Personal history of other diseases of the circulatory system: Secondary | ICD-10-CM | POA: Diagnosis not present

## 2019-06-17 DIAGNOSIS — Z87891 Personal history of nicotine dependence: Secondary | ICD-10-CM | POA: Insufficient documentation

## 2019-06-17 DIAGNOSIS — I7300 Raynaud's syndrome without gangrene: Secondary | ICD-10-CM | POA: Insufficient documentation

## 2019-06-17 DIAGNOSIS — R072 Precordial pain: Secondary | ICD-10-CM | POA: Diagnosis not present

## 2019-06-17 DIAGNOSIS — I6523 Occlusion and stenosis of bilateral carotid arteries: Secondary | ICD-10-CM | POA: Insufficient documentation

## 2019-06-17 MED ORDER — ROSUVASTATIN CALCIUM 10 MG PO TABS: 10.0000 mg | ORAL_TABLET | Freq: Every day | ORAL | 2 refills | Status: DC

## 2019-06-17 MED ORDER — METOPROLOL TARTRATE 100 MG PO TABS: 100.0000 mg | ORAL_TABLET | Freq: Once | ORAL | 0 refills | Status: DC

## 2019-06-17 NOTE — Patient Instructions (Addendum)
Medication Instructions:  Your physician has recommended you make the following change in your medication:   START: Crestor 10 mg daily   *If you need a refill on your cardiac medications before your next appointment, please call your pharmacy*   Lab Work: Your physician recommends that you return for lab work 3-7 days before scheduled ct: bmp   If you have labs (blood work) drawn today and your tests are completely normal, you will receive your results only by: Marland Kitchen MyChart Message (if you have MyChart) OR . A paper copy in the mail If you have any lab test that is abnormal or we need to change your treatment, we will call you to review the results.   Testing/Procedures: Your physician has requested that you have an echocardiogram. Echocardiography is a painless test that uses sound waves to create images of your heart. It provides your doctor with information about the size and shape of your heart and how well your heart's chambers and valves are working. This procedure takes approximately one hour. There are no restrictions for this procedure.  Your cardiac CT will be scheduled at one of the below locations:   [facility] [address] [place], [place] [address] [phone]  [facility] [address] [place], [place] [address] [phone]  If scheduled at [facility], please arrive at the [facility] main entrance of [facility] 30 minutes prior to test start time. Proceed to the [facility] Radiology Department (first floor) to check-in and test prep.  If scheduled at [facility], please arrive 15 mins early for check-in and test prep.  Please follow these instructions carefully (unless otherwise directed):    On the Night Before the Test: . Be sure to Drink plenty of water. . Do not consume any caffeinated/decaffeinated beverages or chocolate 12 hours prior to  your test. . Do not take any antihistamines 12 hours prior to your test. .    On the Day of the Test: . Drink plenty of water. Do not drink any water within one hour of the test. . Do not eat any food 4 hours prior to the test. . You may take your regular medications prior to the test.  . Take metoprolol (Lopressor) two hours prior to test. . HOLD Furosemide/Hydrochlorothiazide morning of the test. . FEMALES- please wear underwire-free bra if available   *For Clinical Staff only. Please instruct patient the following:*        -Drink plenty of water       -Hold Furosemide/hydrochlorothiazide morning of the test       -Take metoprolol (Lopressor) 100 mg 2 hours prior to test (if applicable).          After the Test: . Drink plenty of water. . After receiving IV contrast, you may experience a mild flushed feeling. This is normal. . On occasion, you may experience a mild rash up to 24 hours after the test. This is not dangerous. If this occurs, you can take Benadryl 25 mg and increase your fluid intake. . If you experience trouble breathing, this can be serious. If it is severe call 911 IMMEDIATELY. If it is mild, please call our office. . If you take any of these medications: Glipizide/Metformin, Avandament, Glucavance, please do not take 48 hours after completing test unless otherwise instructed.   Once we have confirmed authorization from your insurance company, we will call you to set  up a date and time for your test.   For non-scheduling related questions, please contact the cardiac imaging nurse navigator should you have any questions/concerns: Marchia Bond, RN Navigator Cardiac Imaging [facility] [phone] office  For scheduling needs, including cancellations and rescheduling, please call [phone].      Follow-Up: At [facility], you and your health needs are our priority.  As part of our continuing mission to provide you with  exceptional heart care, we have created designated Provider Care Teams.  These Care Teams include your primary Cardiologist (physician) and Advanced Practice Providers (APPs -  Physician Assistants and Nurse Practitioners) who all work together to provide you with the care you need, when you need it.  We recommend signing up for the patient portal called "MyChart".  Sign up information is provided on this After Visit Summary.  MyChart is used to connect with patients for Virtual Visits (Telemedicine).  Patients are able to view lab/test results, encounter notes, upcoming appointments, etc.  Non-urgent messages can be sent to your provider as well.   To learn more about what you can do with MyChart, go to [email or web address].    Your next appointment:   3 month(s)  The format for your next appointment:   In Person  Provider:   Berniece Salines, DO   Other Instructions  Dr. Harriet Masson has referred you to vascular surgery they should call you within 1 week for an appointment if not please call our office.   Rosuvastatin Tablets What is this medicine? ROSUVASTATIN (roe SOO va sta tin) is known as a HMG-CoA reductase inhibitor or 'statin'. It lowers cholesterol and triglycerides in the blood. This drug may also reduce the risk of heart attack, stroke, or other health problems in patients with risk factors for heart disease. Diet and lifestyle changes are often used with this drug. This medicine may be used for other purposes; ask your health care provider or pharmacist if you have questions. COMMON BRAND NAME(S): Crestor What should I tell my health care provider before I take this medicine? They need to know if you have any of these conditions:  diabetes  if you often drink alcohol  history of stroke  kidney disease  liver disease  muscle aches or weakness  thyroid disease  an unusual or allergic reaction to rosuvastatin, other medicines, foods, dyes, or preservatives  pregnant or  trying to get pregnant  breast-feeding How should I use this medicine? Take this medicine by mouth with a glass of water. Follow the directions on the prescription label. Do not cut, crush or chew this medicine. You can take this medicine with or without food. Take your doses at regular intervals. Do not take your medicine more often than directed. Talk to your pediatrician regarding the use of this medicine in children. While this drug may be prescribed for children as young as 57 years old for selected conditions, precautions do apply. Overdosage: If you think you have taken too much of this medicine contact a poison control center or emergency room at once. NOTE: This medicine is only for you. Do not share this medicine with others. What if I miss a dose? If you miss a dose, take it as soon as you can. If your next dose is to be taken in less than 12 hours, then do not take the missed dose. Take the next dose at your regular time. Do not take double or extra doses. What may interact with this medicine?  Do not take this medicine with any of the following medications:  herbal medicines like red yeast rice This medicine may also interact with the following medications:  alcohol  antacids containing aluminum hydroxide or magnesium hydroxide  cyclosporine  other medicines for high cholesterol  some medicines for HIV infection  warfarin This list may not describe all possible interactions. Give your health care provider a list of all the medicines, herbs, non-prescription drugs, or dietary supplements you use. Also tell them if you smoke, drink alcohol, or use illegal drugs. Some items may interact with your medicine. What should I watch for while using this medicine? Visit your doctor or health care professional for regular check-ups. You may need regular tests to make sure your liver is working properly. Your health care professional may tell you to stop taking this medicine if you  develop muscle problems. If your muscle problems do not go away after stopping this medicine, contact your health care professional. Do not become pregnant while taking this medicine. Women should inform their health care professional if they wish to become pregnant or think they might be pregnant. There is a potential for serious side effects to an unborn child. Talk to your health care professional or pharmacist for more information. Do not breast-feed an infant while taking this medicine. This medicine may increase blood sugar. Ask your healthcare provider if changes in diet or medicines are needed if you have diabetes. If you are going to need surgery or other procedure, tell your doctor that you are using this medicine. This drug is only part of a total heart-health program. Your doctor or a dietician can suggest a low-cholesterol and low-fat diet to help. Avoid alcohol and smoking, and keep a proper exercise schedule. This medicine may cause a decrease in Co-Enzyme Q-10. You should make sure that you get enough Co-Enzyme Q-10 while you are taking this medicine. Discuss the foods you eat and the vitamins you take with your health care professional. What side effects may I notice from receiving this medicine? Side effects that you should report to your doctor or health care professional as soon as possible:  allergic reactions like skin rash, itching or hives, swelling of the face, lips, or tongue  confusion  joint pain  loss of memory  redness, blistering, peeling or loosening of the skin, including inside the mouth  signs and symptoms of high blood sugar such as being more thirsty or hungry or having to urinate more than normal. You may also feel very tired or have blurry vision.  signs and symptoms of muscle injury like dark urine; trouble passing urine or change in the amount of urine; unusually weak or tired; muscle pain or side or back pain  yellowing of the eyes or skin Side  effects that usually do not require medical attention (report to your doctor or health care professional if they continue or are bothersome):  constipation  diarrhea  dizziness  gas  headache  nausea  stomach pain  trouble sleeping  upset stomach This list may not describe all possible side effects. Call your doctor for medical advice about side effects. You may report side effects to FDA at 1-800-FDA-1088. Where should I keep my medicine? Keep out of the reach of children. Store at room temperature between 20 and 25 degrees C (68 and 77 degrees F). Keep container tightly closed (protect from moisture). Throw away any unused medicine after the expiration date. NOTE: This sheet is a summary. It may not  cover all possible information. If you have questions about this medicine, talk to your doctor, pharmacist, or health care provider.  2020 Elsevier/Gold Standard (2017-11-30 08:25:08)   Cardiac CT Angiogram A cardiac CT angiogram is a procedure to look at the heart and the area around the heart. It may be done to help find the cause of chest pains or other symptoms of heart disease. During this procedure, a substance called contrast dye is injected into the blood vessels in the area to be checked. A large X-ray machine, called a CT scanner, then takes detailed pictures of the heart and the surrounding area. The procedure is also sometimes called a coronary CT angiogram, coronary artery scanning, or CTA. A cardiac CT angiogram allows the health care provider to see how well blood is flowing to and from the heart. The health care provider will be able to see if there are any problems, such as:  Blockage or narrowing of the coronary arteries in the heart.  Fluid around the heart.  Signs of weakness or disease in the muscles, valves, and tissues of the heart. Tell a health care provider about:  Any allergies you have. This is especially important if you have had a previous allergic  reaction to contrast dye.  All medicines you are taking, including vitamins, herbs, eye drops, creams, and over-the-counter medicines.  Any blood disorders you have.  Any surgeries you have had.  Any medical conditions you have.  Whether you are pregnant or may be pregnant.  Any anxiety disorders, chronic pain, or other conditions you have that may increase your stress or prevent you from lying still. What are the risks? Generally, this is a safe procedure. However, problems may occur, including:  Bleeding.  Infection.  Allergic reactions to medicines or dyes.  Damage to other structures or organs.  Kidney damage from the contrast dye that is used.  Increased risk of cancer from radiation exposure. This risk is low. Talk with your health care provider about: ? The risks and benefits of testing. ? How you can receive the lowest dose of radiation. What happens before the procedure?  Wear comfortable clothing and remove any jewelry, glasses, dentures, and hearing aids.  Follow instructions from your health care provider about eating and drinking. This may include: ? For 12 hours before the procedure -- avoid caffeine. This includes tea, coffee, soda, energy drinks, and diet pills. Drink plenty of water or other fluids that do not have caffeine in them. Being well hydrated can prevent complications. ? For 4-6 hours before the procedure -- stop eating and drinking. The contrast dye can cause nausea, but this is less likely if your stomach is empty.  Ask your health care provider about changing or stopping your regular medicines. This is especially important if you are taking diabetes medicines, blood thinners, or medicines to treat problems with erections (erectile dysfunction). What happens during the procedure?   Hair on your chest may need to be removed so that small sticky patches called electrodes can be placed on your chest. These will transmit information that helps to  monitor your heart during the procedure.  An IV will be inserted into one of your veins.  You might be given a medicine to control your heart rate during the procedure. This will help to ensure that good images are obtained.  You will be asked to lie on an exam table. This table will slide in and out of the CT machine during the procedure.  Contrast  dye will be injected into the IV. You might feel warm, or you may get a metallic taste in your mouth.  You will be given a medicine called nitroglycerin. This will relax or dilate the arteries in your heart.  The table that you are lying on will move into the CT machine tunnel for the scan.  The person running the machine will give you instructions while the scans are being done. You may be asked to: ? Keep your arms above your head. ? Hold your breath. ? Stay very still, even if the table is moving.  When the scanning is complete, you will be moved out of the machine.  The IV will be removed. The procedure may vary among health care providers and hospitals. What can I expect after the procedure? After your procedure, it is common to have:  A metallic taste in your mouth from the contrast dye.  A feeling of warmth.  A headache from the nitroglycerin. Follow these instructions at home:  Take over-the-counter and prescription medicines only as told by your health care provider.  If you are told, drink enough fluid to keep your urine pale yellow. This will help to flush the contrast dye out of your body.  Most people can return to their normal activities right after the procedure. Ask your health care provider what activities are safe for you.  It is up to you to get the results of your procedure. Ask your health care provider, or the department that is doing the procedure, when your results will be ready.  Keep all follow-up visits as told by your health care provider. This is important. Contact a health care provider if:  You  have any symptoms of allergy to the contrast dye. These include: ? Shortness of breath. ? Rash or hives. ? A racing heartbeat. Summary  A cardiac CT angiogram is a procedure to look at the heart and the area around the heart. It may be done to help find the cause of chest pains or other symptoms of heart disease.  During this procedure, a large X-ray machine, called a CT scanner, takes detailed pictures of the heart and the surrounding area after a contrast dye has been injected into blood vessels in the area.  Ask your health care provider about changing or stopping your regular medicines before the procedure. This is especially important if you are taking diabetes medicines, blood thinners, or medicines to treat erectile dysfunction.  If you are told, drink enough fluid to keep your urine pale yellow. This will help to flush the contrast dye out of your body. This information is not intended to replace advice given to you by your health care provider. Make sure you discuss any questions you have with your health care provider. Document Revised: 10/03/2018 Document Reviewed: 10/03/2018 Elsevier Patient Education  [place].   Echocardiogram An echocardiogram is a procedure that uses painless sound waves (ultrasound) to produce an image of the heart. Images from an echocardiogram can provide important information about:  Signs of coronary artery disease (CAD).  Aneurysm detection. An aneurysm is a weak or damaged part of an artery wall that bulges out from the normal force of blood pumping through the body.  Heart size and shape. Changes in the size or shape of the heart can be associated with certain conditions, including heart failure, aneurysm, and CAD.  Heart muscle function.  Heart valve function.  Signs of a past heart attack.  Fluid buildup around  the heart.  Thickening of the heart muscle.  A tumor or infectious growth around the heart valves. Tell a health care  provider about:  Any allergies you have.  All medicines you are taking, including vitamins, herbs, eye drops, creams, and over-the-counter medicines.  Any blood disorders you have.  Any surgeries you have had.  Any medical conditions you have.  Whether you are pregnant or may be pregnant. What are the risks? Generally, this is a safe procedure. However, problems may occur, including:  Allergic reaction to dye (contrast) that may be used during the procedure. What happens before the procedure? No specific preparation is needed. You may eat and drink normally. What happens during the procedure?   An IV tube may be inserted into one of your veins.  You may receive contrast through this tube. A contrast is an injection that improves the quality of the pictures from your heart.  A gel will be applied to your chest.  A wand-like tool (transducer) will be moved over your chest. The gel will help to transmit the sound waves from the transducer.  The sound waves will harmlessly bounce off of your heart to allow the heart images to be captured in real-time motion. The images will be recorded on a computer. The procedure may vary among health care providers and hospitals. What happens after the procedure?  You may return to your normal, everyday life, including diet, activities, and medicines, unless your health care provider tells you not to do that. Summary  An echocardiogram is a procedure that uses painless sound waves (ultrasound) to produce an image of the heart.  Images from an echocardiogram can provide important information about the size and shape of your heart, heart muscle function, heart valve function, and fluid buildup around your heart.  You do not need to do anything to prepare before this procedure. You may eat and drink normally.  After the echocardiogram is completed, you may return to your normal, everyday life, unless your health care provider tells you not to do  that. This information is not intended to replace advice given to you by your health care provider. Make sure you discuss any questions you have with your health care provider. Document Revised: 05/31/2018 Document Reviewed: 03/12/2016 Elsevier Patient Education  [place].

## 2019-06-17 NOTE — Progress Notes (Signed)
Cardiology Office Note:    Date:  06/17/2019   ID:  [name], DOB 1955/10/24, MRN [id number]  PCP:  Carollee Herter, Alferd Apa, DO  Cardiologist:  Berniece Salines, DO  Electrophysiologist:  None   Referring MD: Carollee Herter, Alferd Apa, *   Chief Complaint  Patient presents with  . New Patient (Initial Visit)    History of Present Illness:    Kathryn Mcdaniel is a 64 y.o. female with a hx of breast cancer status post chemo and radiation, basal cell carcinoma, hypothyroidism, former smoker, recently diagnosed carotid artery stenosis presents today to be evaluated for abnormal ABIs, leg discoloration/pain as well as chest pain.  The patient tells me that over the past year she has been experiencing left total mostly looking blue compared to her right toe as well as some leg pain on ambulation.  In addition she notes that she also has had some intermittent left-sided chest pain.  She describes the chest pain as a squeezing sensation which at times is midsternal but sometimes move around the back and to her shoulders.  Pain last for few minutes prior to resolution.  Sometimes she has some intermittent dizziness but not always.  She denies shortness of breath.  Her leg pain is most bothersome compared to her chest pain but she is experiencing both symptoms.  Follow-up  Past Medical History:  Diagnosis Date  . Abnormal breast biopsy   . Basal cell carcinoma of skin    left temple  . Breast cancer ([place])   . Depression   . 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 Personal history of cervical dysplasia   . Personal history of chemotherapy   . Personal history of radiation therapy   . Thyroid disease   . Unspecified personal history presenting hazards to health    Radiation Therapy    Past Surgical History:  Procedure Laterality Date  . BREAST BIOPSY    .  BREAST SURGERY     right mastectomy  . MASS EXCISION Right 08/09/2013   Procedure: MINOR EXCISION OF MASS CHEST WALL;  Surgeon: [name], MD;  Location: [facility];  Service: General;  Laterality: Right;  . MASTECTOMY Right   . NASAL SINUS SURGERY     sinus stripping    Current Medications: Current Meds  Medication Sig  . ALPRAZolam (XANAX) 0.5 MG tablet Take 1 tablet (0.5 mg total) by mouth at bedtime as needed.  . Ascorbic Acid (VITAMIN C) 1000 MG tablet Take 1,000 mg by mouth daily.  Marland Kitchen aspirin EC 81 MG tablet Take 81 mg by mouth daily.  . Calcium Carbonate-Vitamin D (CALTRATE 600+D) 600-400 MG-UNIT per tablet Take 1 tablet by mouth 2 (two) times daily.  . cholecalciferol (VITAMIN D) 1000 UNITS tablet Take 1,000 Units by mouth daily.  Marland Kitchen FLUoxetine (PROZAC) 40 MG capsule Take 40 mg by mouth 2 (two) times daily.  Marland Kitchen levothyroxine (SYNTHROID) 88 MCG tablet TAKE 1 TABLET (88 MCG TOTAL) BY MOUTH DAILY BEFORE BREAKFAST.  . Multiple Vitamin (MULTIVITAMIN) tablet Take 1 tablet by mouth daily.  . NONFORMULARY OR COMPOUNDED ITEM Compression socks #1  As directed  . vitamin E 400 UNIT capsule Take 400 Units by mouth daily.     Allergies:   Compazine [prochlorperazine edisylate] and Metoclopramide hcl   Social History   Socioeconomic History  . Marital status: Single  Spouse name: Not on file  . Number of children: Not on file  . Years of education: Not on file  . Highest education level: Not on file  Occupational History  . Occupation: self employed  Tobacco Use  . Smoking status: Former Smoker    Quit date: 09/15/1984    Years since quitting: 34.7  . Smokeless tobacco: Never Used  Substance and Sexual Activity  . Alcohol use: Yes    Alcohol/week: 14.0 standard drinks    Types: 14 Cans of beer per week    Comment: daily  . Drug use: No  . Sexual activity: Not on file  Other Topics Concern  . Not on file  Social History Narrative   Single   Work;  Therapist, occupational, has a dog walking business   Exercise-- swim, walking dogs and bike   Social Determinants of Radio broadcast assistant Strain:   . Difficulty of Paying Living Expenses:   Food Insecurity:   . Worried About Charity fundraiser in the Last Year:   . Arboriculturist in the Last Year:   Transportation Needs:   . Film/video editor (Medical):   Marland Kitchen Lack of Transportation (Non-Medical):   Physical Activity:   . Days of Exercise per Week:   . Minutes of Exercise per Session:   Stress:   . Feeling of Stress :   Social Connections:   . Frequency of Communication with Friends and Family:   . Frequency of Social Gatherings with Friends and Family:   . Attends Religious Services:   . Active Member of Clubs or Organizations:   . Attends Archivist Meetings:   Marland Kitchen Marital Status:      Family History: The patient's family history includes Breast cancer in her mother and paternal grandmother; Cancer in her brother, father, and another family member; Diabetes in her father; Hypertension in her father; Leukemia in her sister; Squamous cell carcinoma in her mother; Stroke in her maternal grandmother; Transient ischemic attack in her mother. There is no history of Colon cancer or Coronary artery disease.  ROS:   Review of Systems  Constitution: Negative for decreased appetite, fever and weight gain.  HENT: Negative for congestion, ear discharge, hoarse voice and sore throat.   Eyes: Negative for discharge, redness, vision loss in right eye and visual halos.  Cardiovascular: Negative for chest pain, dyspnea on exertion, leg swelling, orthopnea and palpitations.  Respiratory: Negative for cough, hemoptysis, shortness of breath and snoring.   Endocrine: Negative for heat intolerance and polyphagia.  Hematologic/Lymphatic: Negative for bleeding problem. Does not bruise/bleed easily.  Skin: Negative for flushing, nail changes, rash and suspicious lesions.    Musculoskeletal: Negative for arthritis, joint pain, muscle cramps, myalgias, neck pain and stiffness.  Gastrointestinal: Negative for abdominal pain, bowel incontinence, diarrhea and excessive appetite.  Genitourinary: Negative for decreased libido, genital sores and incomplete emptying.  Neurological: Negative for brief paralysis, focal weakness, headaches and loss of balance.  Psychiatric/Behavioral: Negative for altered mental status, depression and suicidal ideas.  Allergic/Immunologic: Negative for HIV exposure and persistent infections.    EKGs/Labs/Other Studies Reviewed:    The following studies were reviewed today:   EKG:  The ekg ordered today demonstrates sinus rhythm, HR 71 bpm with p wave morphology suggesting left atrial enlargement.   Carotid Ultrasound Summary: 06/06/2019 Right Carotid: Velocities in the right ICA are consistent with a 40-59%  stenosis.  Left Carotid: Velocities in the left ICA are consistent with  a 1-39% stenosis.  Vertebrals: Bilateral vertebral arteries demonstrate antegrade flow.  Subclavians: Normal flow hemodynamics were seen in bilateral subclavian arteries.   VAS ABI Summary:  Right: Resting right ankle-brachial index is within normal range. No  evidence of significant right lower extremity arterial disease. The right  toe-brachial index is normal.    Left: Resting left ankle-brachial index is within normal range. No  evidence of significant left lower extremity arterial disease. The left  toe-brachial index is abnormal.    Recent Labs: 03/04/2019: ALT 42; BUN 16; Creatinine, Ser 0.62; Hemoglobin 12.8; Platelets 206.0; Potassium 4.8; Sodium 139; TSH 2.47  Recent Lipid Panel    Component Value Date/Time   CHOL 166 03/04/2019 1343   TRIG 75.0 03/04/2019 1343   HDL 73.80 03/04/2019 1343   CHOLHDL 2 03/04/2019 1343   VLDL 15.0 03/04/2019 1343   LDLCALC 77 03/04/2019 1343    Physical Exam:    VS:  BP 110/70   Pulse 71   Ht 5\' 7"   (1.702 m)   Wt 114 lb (51.7 kg)   SpO2 99%   BMI 17.85 kg/m     Wt Readings from Last 3 Encounters:  06/17/19 114 lb (51.7 kg)  06/03/19 114 lb (51.7 kg)  03/04/19 115 lb 9.6 oz (52.4 kg)     GEN: Well nourished, well developed in no acute distress HEENT: Normal NECK: No JVD; No carotid bruits LYMPHATICS: No lymphadenopathy CARDIAC: S1S2 noted,RRR, no murmurs, rubs, gallops RESPIRATORY:  Clear to auscultation without rales, wheezing or rhonchi  ABDOMEN: Soft, non-tender, non-distended, +bowel sounds, no guarding. EXTREMITIES: No edema, left great toe cyanotic, +2 dp pulses in the right but left +1 faint pulse, mild lubbing MUSCULOSKELETAL:  No deformity  SKIN: Warm and dry NEUROLOGIC:  Alert and oriented x 3, non-focal PSYCHIATRIC:  Normal affect, good insight  ASSESSMENT:    1. Precordial pain   2. H/O intermittent claudication   3. Abnormal left toe brachial index   4. History of chemotherapy   5. History of radiation therapy   6. Bilateral carotid artery stenosis   7. Raynaud's disease without gangrene   8. Former smoker    PLAN:      Her chest is concerning given her risk factor (family history, former smoker, rheumatologic disease), therefore going to pursue an ischemic evaluation in this patient.  I have discussed with the patient and educated her about a coronary CTA.  She has no IV dye or contrast allergies and she is a great candidate for this testing.  In addition she reports a history which is concerning for intermittent claudication, or physical exam with her left toe appears mildly cyanotic and given her history of smoking as well as decreased dorsalis pedis pulses, despite a history of Raynaud's disease I am concern of peripheral artery disease in this patient.  Note that her left toe brachial index was reported to be abnormal.  Therefore I will be referring her to vascular surgery for further evaluation hopefully an exercise ABI as well.  I will order a  transthoracic echocardiogram in this patient to assess LV function and right-sided heart pressures.  She has a history of radiation therapy as well as chemotherapy with doxorubicin and has not had an echocardiogram since these therapies.  In addition with her Raynaud's disease right heart pressures will help me to understand any findings of pulmonary hypertension who she is susceptible to her due to her Raynaud's disease.  She has been started on aspirin 81 mg  daily in the setting of her coronary artery disease, I am going to add Crestor 10 mg daily.   The patient is in agreement with the above plan. The patient left the office in stable condition.  The patient will follow up in 3 months or sooner if needed.   Medication Adjustments/Labs and Tests Ordered: Current medicines are reviewed at length with the patient today.  Concerns regarding medicines are outlined above.  Orders Placed This Encounter  Procedures  . CT CORONARY MORPH W/CTA COR W/SCORE W/CA W/CM &/OR WO/CM  . CT CORONARY FRACTIONAL FLOW RESERVE DATA PREP  . CT CORONARY FRACTIONAL FLOW RESERVE FLUID ANALYSIS  . Basic metabolic panel  . Ambulatory referral to Vascular Surgery  . EKG 12-Lead  . ECHOCARDIOGRAM COMPLETE   Meds ordered this encounter  Medications  . rosuvastatin (CRESTOR) 10 MG tablet    Sig: Take 1 tablet (10 mg total) by mouth daily.    Dispense:  30 tablet    Refill:  2  . metoprolol tartrate (LOPRESSOR) 100 MG tablet    Sig: Take 1 tablet (100 mg total) by mouth once for 1 dose. 2 hour before ct    Dispense:  1 tablet    Refill:  0    Patient Instructions  Medication Instructions:  Your physician has recommended you make the following change in your medication:   START: Crestor 10 mg daily   *If you need a refill on your cardiac medications before your next appointment, please call your pharmacy*   Lab Work: Your physician recommends that you return for lab work 3-7 days before scheduled ct: bmp     If you have labs (blood work) drawn today and your tests are completely normal, you will receive your results only by: Marland Kitchen MyChart Message (if you have MyChart) OR . A paper copy in the mail If you have any lab test that is abnormal or we need to change your treatment, we will call you to review the results.   Testing/Procedures: Your physician has requested that you have an echocardiogram. Echocardiography is a painless test that uses sound waves to create images of your heart. It provides your doctor with information about the size and shape of your heart and how well your heart's chambers and valves are working. This procedure takes approximately one hour. There are no restrictions for this procedure.  Your cardiac CT will be scheduled at one of the below locations:   [facility] [address] [place], [place] [address] [phone]  [facility] [address] [place], [place] [address] [phone]  If scheduled at [facility], please arrive at the [facility] main entrance of [facility] 30 minutes prior to test start time. Proceed to the [facility] Radiology Department (first floor) to check-in and test prep.  If scheduled at [facility], please arrive 15 mins early for check-in and test prep.  Please follow these instructions carefully (unless otherwise directed):    On the Night Before the Test: . Be sure to Drink plenty of water. . Do not consume any caffeinated/decaffeinated beverages or chocolate 12 hours prior to your test. . Do not take any antihistamines 12 hours prior to your test. .    On the Day of the Test: . Drink plenty of water. Do not drink any water within one hour of the test. . Do not eat any food 4 hours prior to the  test. . You may take your regular medications prior to the test.  . Take metoprolol (Lopressor) two hours prior to  test. . HOLD Furosemide/Hydrochlorothiazide morning of the test. . FEMALES- please wear underwire-free bra if available   *For Clinical Staff only. Please instruct patient the following:*        -Drink plenty of water       -Hold Furosemide/hydrochlorothiazide morning of the test       -Take metoprolol (Lopressor) 100 mg 2 hours prior to test (if applicable).          After the Test: . Drink plenty of water. . After receiving IV contrast, you may experience a mild flushed feeling. This is normal. . On occasion, you may experience a mild rash up to 24 hours after the test. This is not dangerous. If this occurs, you can take Benadryl 25 mg and increase your fluid intake. . If you experience trouble breathing, this can be serious. If it is severe call 911 IMMEDIATELY. If it is mild, please call our office. . If you take any of these medications: Glipizide/Metformin, Avandament, Glucavance, please do not take 48 hours after completing test unless otherwise instructed.   Once we have confirmed authorization from your insurance company, we will call you to set up a date and time for your test.   For non-scheduling related questions, please contact the cardiac imaging nurse navigator should you have any questions/concerns: Marchia Bond, RN Navigator Cardiac Imaging [facility] [phone] office  For scheduling needs, including cancellations and rescheduling, please call [phone].      Follow-Up: At [facility], you and your health needs are our priority.  As part of our continuing mission to provide you with exceptional heart care, we have created designated Provider Care Teams.  These Care Teams include your primary Cardiologist (physician) and Advanced Practice Providers (APPs -  Physician Assistants and Nurse Practitioners) who all work together to provide you with the care you need, when you need it.  We recommend signing up for the patient portal  called "MyChart".  Sign up information is provided on this After Visit Summary.  MyChart is used to connect with patients for Virtual Visits (Telemedicine).  Patients are able to view lab/test results, encounter notes, upcoming appointments, etc.  Non-urgent messages can be sent to your provider as well.   To learn more about what you can do with MyChart, go to [email or web address].    Your next appointment:   3 month(s)  The format for your next appointment:   In Person  Provider:   Berniece Salines, DO   Other Instructions  Dr. Harriet Masson has referred you to vascular surgery they should call you within 1 week for an appointment if not please call our office.   Rosuvastatin Tablets What is this medicine? ROSUVASTATIN (roe SOO va sta tin) is known as a HMG-CoA reductase inhibitor or 'statin'. It lowers cholesterol and triglycerides in the blood. This drug may also reduce the risk of heart attack, stroke, or other health problems in patients with risk factors for heart disease. Diet and lifestyle changes are often used with this drug. This medicine may be used for other purposes; ask your health care provider or pharmacist if you have questions. COMMON BRAND NAME(S): Crestor What should I tell my health care provider before I take this medicine? They need to know if you have any of these conditions:  diabetes  if you often drink alcohol  history of  stroke  kidney disease  liver disease  muscle aches or weakness  thyroid disease  an unusual or allergic reaction to rosuvastatin, other medicines, foods, dyes, or preservatives  pregnant or trying to get pregnant  breast-feeding How should I use this medicine? Take this medicine by mouth with a glass of water. Follow the directions on the prescription label. Do not cut, crush or chew this medicine. You can take this medicine with or without food. Take your doses at regular intervals. Do not take your medicine more often than  directed. Talk to your pediatrician regarding the use of this medicine in children. While this drug may be prescribed for children as young as 43 years old for selected conditions, precautions do apply. Overdosage: If you think you have taken too much of this medicine contact a poison control center or emergency room at once. NOTE: This medicine is only for you. Do not share this medicine with others. What if I miss a dose? If you miss a dose, take it as soon as you can. If your next dose is to be taken in less than 12 hours, then do not take the missed dose. Take the next dose at your regular time. Do not take double or extra doses. What may interact with this medicine? Do not take this medicine with any of the following medications:  herbal medicines like red yeast rice This medicine may also interact with the following medications:  alcohol  antacids containing aluminum hydroxide or magnesium hydroxide  cyclosporine  other medicines for high cholesterol  some medicines for HIV infection  warfarin This list may not describe all possible interactions. Give your health care provider a list of all the medicines, herbs, non-prescription drugs, or dietary supplements you use. Also tell them if you smoke, drink alcohol, or use illegal drugs. Some items may interact with your medicine. What should I watch for while using this medicine? Visit your doctor or health care professional for regular check-ups. You may need regular tests to make sure your liver is working properly. Your health care professional may tell you to stop taking this medicine if you develop muscle problems. If your muscle problems do not go away after stopping this medicine, contact your health care professional. Do not become pregnant while taking this medicine. Women should inform their health care professional if they wish to become pregnant or think they might be pregnant. There is a potential for serious side effects to  an unborn child. Talk to your health care professional or pharmacist for more information. Do not breast-feed an infant while taking this medicine. This medicine may increase blood sugar. Ask your healthcare provider if changes in diet or medicines are needed if you have diabetes. If you are going to need surgery or other procedure, tell your doctor that you are using this medicine. This drug is only part of a total heart-health program. Your doctor or a dietician can suggest a low-cholesterol and low-fat diet to help. Avoid alcohol and smoking, and keep a proper exercise schedule. This medicine may cause a decrease in Co-Enzyme Q-10. You should make sure that you get enough Co-Enzyme Q-10 while you are taking this medicine. Discuss the foods you eat and the vitamins you take with your health care professional. What side effects may I notice from receiving this medicine? Side effects that you should report to your doctor or health care professional as soon as possible:  allergic reactions like skin rash, itching or hives, swelling of the  face, lips, or tongue  confusion  joint pain  loss of memory  redness, blistering, peeling or loosening of the skin, including inside the mouth  signs and symptoms of high blood sugar such as being more thirsty or hungry or having to urinate more than normal. You may also feel very tired or have blurry vision.  signs and symptoms of muscle injury like dark urine; trouble passing urine or change in the amount of urine; unusually weak or tired; muscle pain or side or back pain  yellowing of the eyes or skin Side effects that usually do not require medical attention (report to your doctor or health care professional if they continue or are bothersome):  constipation  diarrhea  dizziness  gas  headache  nausea  stomach pain  trouble sleeping  upset stomach This list may not describe all possible side effects. Call your doctor for medical advice  about side effects. You may report side effects to FDA at 1-800-FDA-1088. Where should I keep my medicine? Keep out of the reach of children. Store at room temperature between 20 and 25 degrees C (68 and 77 degrees F). Keep container tightly closed (protect from moisture). Throw away any unused medicine after the expiration date. NOTE: This sheet is a summary. It may not cover all possible information. If you have questions about this medicine, talk to your doctor, pharmacist, or health care provider.  2020 Elsevier/Gold Standard (2017-11-30 08:25:08)   Cardiac CT Angiogram A cardiac CT angiogram is a procedure to look at the heart and the area around the heart. It may be done to help find the cause of chest pains or other symptoms of heart disease. During this procedure, a substance called contrast dye is injected into the blood vessels in the area to be checked. A large X-ray machine, called a CT scanner, then takes detailed pictures of the heart and the surrounding area. The procedure is also sometimes called a coronary CT angiogram, coronary artery scanning, or CTA. A cardiac CT angiogram allows the health care provider to see how well blood is flowing to and from the heart. The health care provider will be able to see if there are any problems, such as:  Blockage or narrowing of the coronary arteries in the heart.  Fluid around the heart.  Signs of weakness or disease in the muscles, valves, and tissues of the heart. Tell a health care provider about:  Any allergies you have. This is especially important if you have had a previous allergic reaction to contrast dye.  All medicines you are taking, including vitamins, herbs, eye drops, creams, and over-the-counter medicines.  Any blood disorders you have.  Any surgeries you have had.  Any medical conditions you have.  Whether you are pregnant or may be pregnant.  Any anxiety disorders, chronic pain, or other conditions you have that  may increase your stress or prevent you from lying still. What are the risks? Generally, this is a safe procedure. However, problems may occur, including:  Bleeding.  Infection.  Allergic reactions to medicines or dyes.  Damage to other structures or organs.  Kidney damage from the contrast dye that is used.  Increased risk of cancer from radiation exposure. This risk is low. Talk with your health care provider about: ? The risks and benefits of testing. ? How you can receive the lowest dose of radiation. What happens before the procedure?  Wear comfortable clothing and remove any jewelry, glasses, dentures, and hearing aids.  Follow  instructions from your health care provider about eating and drinking. This may include: ? For 12 hours before the procedure -- avoid caffeine. This includes tea, coffee, soda, energy drinks, and diet pills. Drink plenty of water or other fluids that do not have caffeine in them. Being well hydrated can prevent complications. ? For 4-6 hours before the procedure -- stop eating and drinking. The contrast dye can cause nausea, but this is less likely if your stomach is empty.  Ask your health care provider about changing or stopping your regular medicines. This is especially important if you are taking diabetes medicines, blood thinners, or medicines to treat problems with erections (erectile dysfunction). What happens during the procedure?   Hair on your chest may need to be removed so that small sticky patches called electrodes can be placed on your chest. These will transmit information that helps to monitor your heart during the procedure.  An IV will be inserted into one of your veins.  You might be given a medicine to control your heart rate during the procedure. This will help to ensure that good images are obtained.  You will be asked to lie on an exam table. This table will slide in and out of the CT machine during the procedure.  Contrast dye  will be injected into the IV. You might feel warm, or you may get a metallic taste in your mouth.  You will be given a medicine called nitroglycerin. This will relax or dilate the arteries in your heart.  The table that you are lying on will move into the CT machine tunnel for the scan.  The person running the machine will give you instructions while the scans are being done. You may be asked to: ? Keep your arms above your head. ? Hold your breath. ? Stay very still, even if the table is moving.  When the scanning is complete, you will be moved out of the machine.  The IV will be removed. The procedure may vary among health care providers and hospitals. What can I expect after the procedure? After your procedure, it is common to have:  A metallic taste in your mouth from the contrast dye.  A feeling of warmth.  A headache from the nitroglycerin. Follow these instructions at home:  Take over-the-counter and prescription medicines only as told by your health care provider.  If you are told, drink enough fluid to keep your urine pale yellow. This will help to flush the contrast dye out of your body.  Most people can return to their normal activities right after the procedure. Ask your health care provider what activities are safe for you.  It is up to you to get the results of your procedure. Ask your health care provider, or the department that is doing the procedure, when your results will be ready.  Keep all follow-up visits as told by your health care provider. This is important. Contact a health care provider if:  You have any symptoms of allergy to the contrast dye. These include: ? Shortness of breath. ? Rash or hives. ? A racing heartbeat. Summary  A cardiac CT angiogram is a procedure to look at the heart and the area around the heart. It may be done to help find the cause of chest pains or other symptoms of heart disease.  During this procedure, a large X-ray  machine, called a CT scanner, takes detailed pictures of the heart and the surrounding area after a contrast dye  has been injected into blood vessels in the area.  Ask your health care provider about changing or stopping your regular medicines before the procedure. This is especially important if you are taking diabetes medicines, blood thinners, or medicines to treat erectile dysfunction.  If you are told, drink enough fluid to keep your urine pale yellow. This will help to flush the contrast dye out of your body. This information is not intended to replace advice given to you by your health care provider. Make sure you discuss any questions you have with your health care provider. Document Revised: 10/03/2018 Document Reviewed: 10/03/2018 Elsevier Patient Education  [place].   Echocardiogram An echocardiogram is a procedure that uses painless sound waves (ultrasound) to produce an image of the heart. Images from an echocardiogram can provide important information about:  Signs of coronary artery disease (CAD).  Aneurysm detection. An aneurysm is a weak or damaged part of an artery wall that bulges out from the normal force of blood pumping through the body.  Heart size and shape. Changes in the size or shape of the heart can be associated with certain conditions, including heart failure, aneurysm, and CAD.  Heart muscle function.  Heart valve function.  Signs of a past heart attack.  Fluid buildup around the heart.  Thickening of the heart muscle.  A tumor or infectious growth around the heart valves. Tell a health care provider about:  Any allergies you have.  All medicines you are taking, including vitamins, herbs, eye drops, creams, and over-the-counter medicines.  Any blood disorders you have.  Any surgeries you have had.  Any medical conditions you have.  Whether you are pregnant or may be pregnant. What are the risks? Generally, this is a safe procedure.  However, problems may occur, including:  Allergic reaction to dye (contrast) that may be used during the procedure. What happens before the procedure? No specific preparation is needed. You may eat and drink normally. What happens during the procedure?   An IV tube may be inserted into one of your veins.  You may receive contrast through this tube. A contrast is an injection that improves the quality of the pictures from your heart.  A gel will be applied to your chest.  A wand-like tool (transducer) will be moved over your chest. The gel will help to transmit the sound waves from the transducer.  The sound waves will harmlessly bounce off of your heart to allow the heart images to be captured in real-time motion. The images will be recorded on a computer. The procedure may vary among health care providers and hospitals. What happens after the procedure?  You may return to your normal, everyday life, including diet, activities, and medicines, unless your health care provider tells you not to do that. Summary  An echocardiogram is a procedure that uses painless sound waves (ultrasound) to produce an image of the heart.  Images from an echocardiogram can provide important information about the size and shape of your heart, heart muscle function, heart valve function, and fluid buildup around your heart.  You do not need to do anything to prepare before this procedure. You may eat and drink normally.  After the echocardiogram is completed, you may return to your normal, everyday life, unless your health care provider tells you not to do that. This information is not intended to replace advice given to you by your health care provider. Make sure you discuss any questions you have with your health  care provider. Document Revised: 05/31/2018 Document Reviewed: 03/12/2016 Elsevier Patient Education  [place].     Adopting a Healthy Lifestyle.  Know what a healthy weight is  for you (roughly BMI <25) and aim to maintain this   Aim for 7+ servings of fruits and vegetables daily   65-80+ fluid ounces of water or unsweet tea for healthy kidneys   Limit to max 1 drink of alcohol per day; avoid smoking/tobacco   Limit animal fats in diet for cholesterol and heart health - choose grass fed whenever available   Avoid highly processed foods, and foods high in saturated/trans fats   Aim for low stress - take time to unwind and care for your mental health   Aim for 150 min of moderate intensity exercise weekly for heart health, and weights twice weekly for bone health   Aim for 7-9 hours of sleep daily   When it comes to diets, agreement about the perfect plan isnt easy to find, even among the experts. Experts at the [employer] developed an idea known as the Healthy Eating Plate. Just imagine a plate divided into logical, healthy portions.   The emphasis is on diet quality:   Load up on vegetables and fruits - one-half of your plate: Aim for color and variety, and remember that potatoes dont count.   Go for whole grains - one-quarter of your plate: Whole wheat, barley, wheat berries, quinoa, oats, brown rice, and foods made with them. If you want pasta, go with whole wheat pasta.   Protein power - one-quarter of your plate: Fish, chicken, beans, and nuts are all healthy, versatile protein sources. Limit red meat.   The diet, however, does go beyond the plate, offering a few other suggestions.   Use healthy plant oils, such as olive, canola, soy, corn, sunflower and peanut. Check the labels, and avoid partially hydrogenated oil, which have unhealthy trans fats.   If youre thirsty, drink water. Coffee and tea are good in moderation, but skip sugary drinks and limit milk and dairy products to one or two daily servings.   The type of carbohydrate in the diet is more important than the amount. Some sources of carbohydrates, such as vegetables,  fruits, whole grains, and beans-are healthier than others.   Finally, stay active  Signed, Berniece Salines, DO  06/17/2019 8:36 PM    [facility]

## 2019-06-20 ENCOUNTER — Other Ambulatory Visit: Payer: Self-pay

## 2019-06-20 ENCOUNTER — Ambulatory Visit (HOSPITAL_BASED_OUTPATIENT_CLINIC_OR_DEPARTMENT_OTHER)
Admission: RE | Admit: 2019-06-20 | Discharge: 2019-06-20 | Disposition: A | Discharge status: Home / Self Care | Payer: BC Managed Care – PPO | Source: Ambulatory Visit | Attending: Cardiology | Admitting: Cardiology

## 2019-06-20 DIAGNOSIS — R072 Precordial pain: Secondary | ICD-10-CM | POA: Insufficient documentation

## 2019-06-20 DIAGNOSIS — Z9221 Personal history of antineoplastic chemotherapy: Secondary | ICD-10-CM | POA: Diagnosis not present

## 2019-06-20 DIAGNOSIS — I7300 Raynaud's syndrome without gangrene: Secondary | ICD-10-CM | POA: Diagnosis not present

## 2019-06-24 ENCOUNTER — Telehealth: Payer: Self-pay

## 2019-06-24 NOTE — Telephone Encounter (Signed)
Left message on patients voicemail to please return our call.   

## 2019-06-24 NOTE — Telephone Encounter (Signed)
-----   Message from Berniece Salines, DO sent at 06/21/2019  2:04 PM EDT ----- Normal echo.  Please notify patient and send copy to PCP

## 2019-06-25 ENCOUNTER — Telehealth: Payer: Self-pay | Admitting: Cardiology

## 2019-06-25 NOTE — Telephone Encounter (Signed)
° °  Went to chart to check who called pt. Transferred call to [facility]

## 2019-06-25 NOTE — Telephone Encounter (Signed)
Spoke with patient regarding results and recommendation.  Patient verbalizes understanding and is agreeable to plan of care. Advised patient to call back with any issues or concerns.  

## 2019-06-25 NOTE — Telephone Encounter (Signed)
-----   Message from Berniece Salines, DO sent at 06/21/2019  2:04 PM EDT ----- Normal echo.  Please notify patient and send copy to PCP

## 2019-07-25 ENCOUNTER — Encounter: Payer: BC Managed Care – PPO | Admitting: Vascular Surgery

## 2019-07-25 ENCOUNTER — Other Ambulatory Visit: Payer: Self-pay | Admitting: Family Medicine

## 2019-07-25 DIAGNOSIS — Z1231 Encounter for screening mammogram for malignant neoplasm of breast: Secondary | ICD-10-CM

## 2019-08-02 ENCOUNTER — Other Ambulatory Visit: Payer: Self-pay

## 2019-08-02 ENCOUNTER — Ambulatory Visit
Admission: RE | Admit: 2019-08-02 | Discharge: 2019-08-02 | Disposition: A | Discharge status: Home / Self Care | Payer: BC Managed Care – PPO | Source: Ambulatory Visit | Attending: Family Medicine | Admitting: Family Medicine

## 2019-08-02 DIAGNOSIS — Z1231 Encounter for screening mammogram for malignant neoplasm of breast: Secondary | ICD-10-CM

## 2019-08-06 ENCOUNTER — Encounter: Payer: Self-pay | Admitting: Vascular Surgery

## 2019-08-06 ENCOUNTER — Other Ambulatory Visit: Payer: Self-pay

## 2019-08-06 ENCOUNTER — Ambulatory Visit (INDEPENDENT_AMBULATORY_CARE_PROVIDER_SITE_OTHER): Payer: BC Managed Care – PPO | Admitting: Vascular Surgery

## 2019-08-06 VITALS — BP 111/70 | HR 87 | Temp 98.1°F | Resp 18 | Ht 67.0 in | Wt 110.0 lb

## 2019-08-06 DIAGNOSIS — I7300 Raynaud's syndrome without gangrene: Secondary | ICD-10-CM | POA: Diagnosis not present

## 2019-08-06 DIAGNOSIS — I6523 Occlusion and stenosis of bilateral carotid arteries: Secondary | ICD-10-CM | POA: Diagnosis not present

## 2019-08-06 NOTE — Progress Notes (Signed)
Patient name: Kathryn Mcdaniel MRN: [id number] DOB: [birth year] Sex: female  REASON FOR CONSULT: Evaluate for claudication and carotid disease  HPI: Kathryn Mcdaniel is a 64 y.o. female, with history of breast cancer that presents for evaluation of lower extremity arterial disease as well as carotid artery disease. As it relates to her lower extremities she states that she has had discoloration in her toes for at least the last 10 years worse on the left than the right. It is usually a grayish appearance of the toes worse in the wintertime and she states she has been told she has Raynaud's. No pain in the legs when she walks. No pain in the legs at night. No nonhealing wounds. No previous lower extremity interventions. Previously smoked but quit.  As it relates to her carotid disease she had a carotid duplex that showed a 40 to 59% stenosis on the right with only minimal 1 to 39% disease on the the left. Denies any history of TIA or strokes. She is on aspirin and statin for medical management.  Past Medical History:  Diagnosis Date  . Abnormal breast biopsy   . Basal cell carcinoma of skin    left temple  . Breast cancer ([place])   . Depression   . 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 Personal history of cervical dysplasia   . Personal history of chemotherapy   . Personal history of radiation therapy   . Thyroid disease   . Unspecified personal history presenting hazards to health    Radiation Therapy    Past Surgical History:  Procedure Laterality Date  . BREAST BIOPSY    . BREAST SURGERY     right mastectomy  . MASS EXCISION Right 08/09/2013   Procedure: MINOR EXCISION OF MASS CHEST WALL;  Surgeon: [name], MD;  Location: [facility];  Service: General;  Laterality: Right;  . MASTECTOMY Right   . NASAL SINUS SURGERY      sinus stripping    Family History  Problem Relation Age of Onset  . Breast cancer Mother   . Squamous cell carcinoma Mother        sinus  . Transient ischemic attack Mother   . Cancer Father        prostate  . Hypertension Father   . Diabetes Father   . Cancer Brother   . Cancer Other        grandmother, breast cancer  . Breast cancer Paternal Grandmother   . Stroke Maternal Grandmother   . Leukemia Sister   . Colon cancer Neg Hx   . Coronary artery disease Neg Hx     SOCIAL HISTORY: Social History   Socioeconomic History  . Marital status: Single    Spouse name: Not on file  . Number of children: Not on file  . Years of education: Not on file  . Highest education level: Not on file  Occupational History  . Occupation: self employed  Tobacco Use  . Smoking status: Former Smoker    Quit date: 09/15/1984    Years since quitting: 34.9  . Smokeless tobacco: Never Used  Substance and Sexual Activity  . Alcohol use: Yes    Alcohol/week: 14.0 standard drinks    Types: 14 Cans of beer per week    Comment: daily  . Drug use: No  . Sexual activity:  Not on file  Other Topics Concern  . Not on file  Social History Narrative   Single   Work; Therapist, occupational, has a dog walking business   Exercise-- swim, walking dogs and bike   Social Determinants of Radio broadcast assistant Strain:   . Difficulty of Paying Living Expenses:   Food Insecurity:   . Worried About Charity fundraiser in the Last Year:   . Arboriculturist in the Last Year:   Transportation Needs:   . Film/video editor (Medical):   Marland Kitchen Lack of Transportation (Non-Medical):   Physical Activity:   . Days of Exercise per Week:   . Minutes of Exercise per Session:   Stress:   . Feeling of Stress :   Social Connections:   . Frequency of Communication with Friends and Family:   . Frequency of Social Gatherings with Friends and Family:   . Attends Religious Services:   . Active Member of Clubs  or Organizations:   . Attends Archivist Meetings:   Marland Kitchen Marital Status:   Intimate Partner Violence:   . Fear of Current or Ex-Partner:   . Emotionally Abused:   Marland Kitchen Physically Abused:   . Sexually Abused:     Allergies  Allergen Reactions  . Compazine [Prochlorperazine Edisylate]     "bizarre, dystonic"  . Metoclopramide Hcl     Current Outpatient Medications  Medication Sig Dispense Refill  . ALPRAZolam (XANAX) 0.5 MG tablet Take 1 tablet (0.5 mg total) by mouth at bedtime as needed. 30 tablet 0  . Ascorbic Acid (VITAMIN C) 1000 MG tablet Take 1,000 mg by mouth daily.    Marland Kitchen aspirin EC 81 MG tablet Take 81 mg by mouth daily.    . Calcium Carbonate-Vitamin D (CALTRATE 600+D) 600-400 MG-UNIT per tablet Take 1 tablet by mouth 2 (two) times daily.    . cholecalciferol (VITAMIN D) 1000 UNITS tablet Take 1,000 Units by mouth daily.    Marland Kitchen FLUoxetine (PROZAC) 40 MG capsule Take 40 mg by mouth 2 (two) times daily.    Marland Kitchen levothyroxine (SYNTHROID) 88 MCG tablet TAKE 1 TABLET (88 MCG TOTAL) BY MOUTH DAILY BEFORE BREAKFAST. 90 tablet 1  . Multiple Vitamin (MULTIVITAMIN) tablet Take 1 tablet by mouth daily.    . NONFORMULARY OR COMPOUNDED ITEM Compression socks #1  As directed 1 each 1  . rosuvastatin (CRESTOR) 10 MG tablet Take 1 tablet (10 mg total) by mouth daily. 30 tablet 2  . vitamin E 400 UNIT capsule Take 400 Units by mouth daily.    . metoprolol tartrate (LOPRESSOR) 100 MG tablet Take 1 tablet (100 mg total) by mouth once for 1 dose. 2 hour before ct 1 tablet 0   No current facility-administered medications for this visit.    REVIEW OF SYSTEMS:  [X]  denotes positive finding, [ ]  denotes negative finding Cardiac  Comments:  Chest pain or chest pressure:    Shortness of breath upon exertion:    Short of breath when lying flat:    Irregular heart rhythm:        Vascular    Pain in calf, thigh, or hip brought on by ambulation:    Pain in feet at night that wakes you up from  your sleep:     Blood clot in your veins:    Leg swelling:         Pulmonary    Oxygen at home:    Productive cough:  Wheezing:         Neurologic    Sudden weakness in arms or legs:     Sudden numbness in arms or legs:     Sudden onset of difficulty speaking or slurred speech:    Temporary loss of vision in one eye:     Problems with dizziness:         Gastrointestinal    Blood in stool:     Vomited blood:         Genitourinary    Burning when urinating:     Blood in urine:        Psychiatric    Major depression:         Hematologic    Bleeding problems:    Problems with blood clotting too easily:        Skin    Rashes or ulcers:        Constitutional    Fever or chills:      PHYSICAL EXAM: Vitals:   08/06/19 1030  BP: 111/70  Pulse: 87  Resp: 18  Temp: 98.1 F (36.7 C)  TempSrc: Temporal  SpO2: 99%  Weight: 110 lb (49.9 kg)  Height: 5\' 7"  (1.702 m)    GENERAL: The patient is a well-nourished female, in no acute distress. The vital signs are documented above. CARDIAC: There is a regular rate and rhythm.  VASCULAR:  Palpable femoral pulses bilaterally Palpable popliteal pulses bilaterally Palpable dorsalis pedis and posterior tibial pulses bilaterally. Left great toe with bluish hue PULMONARY: There is good air exchange bilaterally without wheezing or rales. ABDOMEN: Soft and non-tender with normal pitched bowel sounds.  MUSCULOSKELETAL: There are no major deformities or cyanosis. NEUROLOGIC: No focal weakness or paresthesias are detected. SKIN: There are no ulcers or rashes noted. PSYCHIATRIC: The patient has a normal affect.  DATA:   I independently reviewed her ABIs which are normal on the right 1.29 triphasic and normal on the left 1.29 and triphasic. Her TBI is decreased on the left but she has a toe pressure greater than 60.  Carotid duplex shows a 40 to 59% stenosis on the right and minimal 1 to 39% disease on the  left.   Assessment/Plan:  64 year old female presents for evaluation of lower extremity claudication as well as carotid disease.  As it relates to her lower extremities she has palpable posterior tibial and dorsalis pedis pulses on exam and has no lower extremity symptoms to suggest claudication and otherwise states her legs feel fine when she walks. Certainly no signs of critical limb ischemia. She was worried about her TBI being decreased on the left and this could be related to small vessel disease but with a toe pressure greater than 60 should be adequate for wound healing and certainly would not do any intervention given she is asymptomatic.  Her symptoms are more suggestive of Raynauds but again could have some underlying small vessel disease.    As it relates to her carotid disease discussed 40 to 59% stenosis on the right that is asymptomatic. Would recommend follow-up in 1 year with carotid duplex for ongoing surveillance. Aspirin statin for medical management.  No role for surgical intervention at this time.   Marty Heck, MD [facility] Office: [phone]

## 2019-09-08 ENCOUNTER — Other Ambulatory Visit: Payer: Self-pay | Admitting: Cardiology

## 2019-09-11 ENCOUNTER — Ambulatory Visit (INDEPENDENT_AMBULATORY_CARE_PROVIDER_SITE_OTHER): Payer: BC Managed Care – PPO | Admitting: Cardiology

## 2019-09-11 ENCOUNTER — Encounter: Payer: Self-pay | Admitting: Cardiology

## 2019-09-11 ENCOUNTER — Other Ambulatory Visit: Payer: Self-pay

## 2019-09-11 VITALS — BP 100/60 | HR 80 | Ht 67.0 in | Wt 112.0 lb

## 2019-09-11 DIAGNOSIS — R072 Precordial pain: Secondary | ICD-10-CM

## 2019-09-11 NOTE — Progress Notes (Signed)
Cardiology Office Note:    Date:  09/11/2019   ID:  Kathryn Mcdaniel, DOB [birth year], MRN [id number]  PCP:  Carollee Herter, Alferd Apa, DO  Cardiologist:  Berniece Salines, DO  Electrophysiologist:  None   Referring MD: Carollee Herter, Alferd Apa, *   Chief Complaint  Patient presents with  . Follow-up    History of Present Illness:    Kathryn Mcdaniel is a 64 y.o. female with a hx of breast cancer status post chemo and radiation, basal cell carcinoma, hypothyroidism, former smoker, recently diagnosed carotid artery stenosis presents today for follow-up visit.  Last saw the patient June 17, 2019 at that time I recommended patient get a echocardiogram as well as a coronary CTA.  In the interim she was able to get the echocardiogram but has not been able to get her coronary CTA.  The patient is here today for follow up visit.  She tells me she has been is improved and she has not had time to get her coronary CTA done.   Past Medical History:  Diagnosis Date  . Abnormal breast biopsy   . Basal cell carcinoma of skin    left temple  . Breast cancer ([place])   . Depression   . 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 Personal history of cervical dysplasia   . Personal history of chemotherapy   . Personal history of radiation therapy   . Thyroid disease   . Unspecified personal history presenting hazards to health    Radiation Therapy    Past Surgical History:  Procedure Laterality Date  . BREAST BIOPSY    . BREAST SURGERY     right mastectomy  . MASS EXCISION Right 08/09/2013   Procedure: MINOR EXCISION OF MASS CHEST WALL;  Surgeon: [name], MD;  Location: [facility];  Service: General;  Laterality: Right;  . MASTECTOMY Right   . NASAL SINUS SURGERY     sinus stripping    Current Medications: Current Meds  Medication Sig  .  ALPRAZolam (XANAX) 0.5 MG tablet Take 1 tablet (0.5 mg total) by mouth at bedtime as needed.  . Ascorbic Acid (VITAMIN C) 1000 MG tablet Take 1,000 mg by mouth daily.  Marland Kitchen aspirin EC 81 MG tablet Take 81 mg by mouth daily.  . Calcium Carbonate-Vitamin D (CALTRATE 600+D) 600-400 MG-UNIT per tablet Take 1 tablet by mouth 2 (two) times daily.  . cholecalciferol (VITAMIN D) 1000 UNITS tablet Take 1,000 Units by mouth daily.  Marland Kitchen FLUoxetine (PROZAC) 40 MG capsule Take 40 mg by mouth 2 (two) times daily.  Marland Kitchen levothyroxine (SYNTHROID) 88 MCG tablet TAKE 1 TABLET (88 MCG TOTAL) BY MOUTH DAILY BEFORE BREAKFAST.  . metoprolol tartrate (LOPRESSOR) 100 MG tablet Take 1 tablet (100 mg total) by mouth once for 1 dose. 2 hour before ct  . Multiple Vitamin (MULTIVITAMIN) tablet Take 1 tablet by mouth daily.  . NONFORMULARY OR COMPOUNDED ITEM Compression socks #1  As directed  . rosuvastatin (CRESTOR) 10 MG tablet TAKE 1 TABLET BY MOUTH EVERY DAY  . vitamin E 400 UNIT capsule Take 400 Units by mouth daily.     Allergies:   Compazine [prochlorperazine edisylate] and Metoclopramide hcl   Social History   Socioeconomic History  . Marital status: Single    Spouse name: Not on file  . Number of children: Not on file  .  Years of education: Not on file  . Highest education level: Not on file  Occupational History  . Occupation: self employed  Tobacco Use  . Smoking status: Former Smoker    Quit date: 09/15/1984    Years since quitting: 35.0  . Smokeless tobacco: Never Used  Substance and Sexual Activity  . Alcohol use: Yes    Alcohol/week: 14.0 standard drinks    Types: 14 Cans of beer per week    Comment: daily  . Drug use: No  . Sexual activity: Not on file  Other Topics Concern  . Not on file  Social History Narrative   Single   Work; Therapist, occupational, has a dog walking business   Exercise-- swim, walking dogs and bike   Social Determinants of Radio broadcast assistant Strain:   .  Difficulty of Paying Living Expenses:   Food Insecurity:   . Worried About Charity fundraiser in the Last Year:   . Arboriculturist in the Last Year:   Transportation Needs:   . Film/video editor (Medical):   Marland Kitchen Lack of Transportation (Non-Medical):   Physical Activity:   . Days of Exercise per Week:   . Minutes of Exercise per Session:   Stress:   . Feeling of Stress :   Social Connections:   . Frequency of Communication with Friends and Family:   . Frequency of Social Gatherings with Friends and Family:   . Attends Religious Services:   . Active Member of Clubs or Organizations:   . Attends Archivist Meetings:   Marland Kitchen Marital Status:      Family History: The patient's family history includes Breast cancer in her mother and paternal grandmother; Cancer in her brother, father, and another family member; Diabetes in her father; Hypertension in her father; Leukemia in her sister; Squamous cell carcinoma in her mother; Stroke in her maternal grandmother; Transient ischemic attack in her mother. There is no history of Colon cancer or Coronary artery disease.  ROS:   Review of Systems  Constitution: Negative for decreased appetite, fever and weight gain.  HENT: Negative for congestion, ear discharge, hoarse voice and sore throat.   Eyes: Negative for discharge, redness, vision loss in right eye and visual halos.  Cardiovascular: Negative for chest pain, dyspnea on exertion, leg swelling, orthopnea and palpitations.  Respiratory: Negative for cough, hemoptysis, shortness of breath and snoring.   Endocrine: Negative for heat intolerance and polyphagia.  Hematologic/Lymphatic: Negative for bleeding problem. Does not bruise/bleed easily.  Skin: Negative for flushing, nail changes, rash and suspicious lesions.  Musculoskeletal: Negative for arthritis, joint pain, muscle cramps, myalgias, neck pain and stiffness.  Gastrointestinal: Negative for abdominal pain, bowel incontinence,  diarrhea and excessive appetite.  Genitourinary: Negative for decreased libido, genital sores and incomplete emptying.  Neurological: Negative for brief paralysis, focal weakness, headaches and loss of balance.  Psychiatric/Behavioral: Negative for altered mental status, depression and suicidal ideas.  Allergic/Immunologic: Negative for HIV exposure and persistent infections.    EKGs/Labs/Other Studies Reviewed:    The following studies were reviewed today:   EKG:  The ekg ordered today demonstrates   TTE Impression 1. Left ventricular ejection fraction, by estimation, is 60 to 65%. The left ventricle has normal function. The left ventricle has no regional wall motion abnormalities. Left ventricular diastolic parameters were normal.  2. Right ventricular systolic function is normal. The right ventricular size is normal. There is normal pulmonary artery systolic pressure.  3. The  mitral valve is normal in structure. Trivial mitral valve regurgitation. No evidence of mitral stenosis.  4. The aortic valve is normal in structure. Aortic valve regurgitation is not visualized. No aortic stenosis is present.  5. The inferior vena cava is normal in size with greater than 50% respiratory variability, suggesting right atrial pressure of 3 mmHg.   Recent Labs: 03/04/2019: ALT 42; BUN 16; Creatinine, Ser 0.62; Hemoglobin 12.8; Platelets 206.0; Potassium 4.8; Sodium 139; TSH 2.47  Recent Lipid Panel    Component Value Date/Time   CHOL 166 03/04/2019 1343   TRIG 75.0 03/04/2019 1343   HDL 73.80 03/04/2019 1343   CHOLHDL 2 03/04/2019 1343   VLDL 15.0 03/04/2019 1343   LDLCALC 77 03/04/2019 1343    Physical Exam:    VS:  BP 100/60 (BP Location: Left Arm, Patient Position: Sitting, Cuff Size: Normal)   Pulse 80   Ht '5\' 7"'  (1.702 m)   Wt 112 lb (50.8 kg)   SpO2 99%   BMI 17.54 kg/m     Wt Readings from Last 3 Encounters:  09/11/19 112 lb (50.8 kg)  08/06/19 110 lb (49.9 kg)  06/17/19  114 lb (51.7 kg)     GEN: Well nourished, well developed in no acute distress HEENT: Normal NECK: No JVD; No carotid bruits LYMPHATICS: No lymphadenopathy CARDIAC: S1S2 noted,RRR, no murmurs, rubs, gallops RESPIRATORY:  Clear to auscultation without rales, wheezing or rhonchi  ABDOMEN: Soft, non-tender, non-distended, +bowel sounds, no guarding. EXTREMITIES: No edema, No cyanosis, no clubbing MUSCULOSKELETAL:  No deformity  SKIN: Warm and dry NEUROLOGIC:  Alert and oriented x 3, non-focal PSYCHIATRIC:  Normal affect, good insight  ASSESSMENT:    1. Precordial pain    PLAN:     Her testing results discussed with the patient today about her testing result.  I still do feel the need for her to get her coronary CTA given her risk factors and her symptom for chest pain as well.  She is agreeable therefore we will go ahead and have the patient reschedule her coronary CTA.  The patient is in agreement with the above plan. The patient left the office in stable condition.  The patient will follow up in 1 year or sooner if needed.   Medication Adjustments/Labs and Tests Ordered: Current medicines are reviewed at length with the patient today.  Concerns regarding medicines are outlined above.  No orders of the defined types were placed in this encounter.  No orders of the defined types were placed in this encounter.   Patient Instructions  Medication Instructions:  No medication changes. *If you need a refill on your cardiac medications before your next appointment, please call your pharmacy*   Lab Work: None ordered If you have labs (blood work) drawn today and your tests are completely normal, you will receive your results only by: Marland Kitchen MyChart Message (if you have MyChart) OR . A paper copy in the mail If you have any lab test that is abnormal or we need to change your treatment, we will call you to review the results.   Testing/Procedures: Your cardiac CT will be scheduled at:    [facility] [address] [place], [place] [address] [phone]  If scheduled at [facility], please arrive at the [facility] main entrance of [facility] 30 minutes prior to test start time. Proceed to the [facility] Radiology Department (first floor) to check-in and test prep.  Please follow these instructions carefully (unless  otherwise directed):   On the Night Before the Test: . Be sure to Drink plenty of water. . Do not consume any caffeinated/decaffeinated beverages or chocolate 12 hours prior to your test. . Do not take any antihistamines 12 hours prior to your test.  On the Day of the Test: . Drink plenty of water. Do not drink any water within one hour of the test. . Do not eat any food 4 hours prior to the test. . You may take your regular medications prior to the test.  . Take metoprolol (Lopressor) two hours prior to test. . HOLD Furosemide/Hydrochlorothiazide morning of the test. . FEMALES- please wear underwire-free bra if available   *For Clinical Staff only. Please instruct patient the following:*        -Drink plenty of water       -Hold Furosemide/hydrochlorothiazide morning of the test       -Take metoprolol (Lopressor) 2 hours prior to test (if applicable).                  -If HR is less than 55 BPM- No Lopressor                -IF HR is greater than 55 BPM and patient is less than or equal to 17 yrs old Lopressor 131m x1.                -If HR is greater than 55 BPM and patient is greater than 735yrs old Lopressor 50 mg x1.     Do not give Lopressor to patients with an allergy to lopressor or anyone with asthma or active COPD symptoms (currently taking steroids).       After the Test: . Drink plenty of water. . After receiving IV contrast, you may experience a mild flushed feeling. This is normal. . On occasion, you may experience a mild rash up to 24 hours after the test. This is not dangerous. If this occurs, you  can take Benadryl 25 mg and increase your fluid intake. . If you experience trouble breathing, this can be serious. If it is severe call 911 IMMEDIATELY. If it is mild, please call our office. . If you take any of these medications: Glipizide/Metformin, Avandament, Glucavance, please do not take 48 hours after completing test unless otherwise instructed.   Once we have confirmed authorization from your insurance company, we will call you to set up a date and time for your test. Based on how quickly your insurance processes prior authorizations requests, please allow up to 4 weeks to be contacted for scheduling your Cardiac CT appointment. Be advised that routine Cardiac CT appointments could be scheduled as many as 8 weeks after your provider has ordered it.  For non-scheduling related questions, please contact the cardiac imaging nurse navigator should you have any questions/concerns: SMarchia Bond Cardiac Imaging Nurse Navigator MBurley Saver Interim Cardiac Imaging Nurse [facility] Direct Office Dial: [phone]  For scheduling needs, including cancellations and rescheduling, please call TVivien Rotaat [phone] option 3.      Follow-Up: [facility] and your [facility] needs are our priority.  As part of our continuing mission to provide you with exceptional heart care, we have created designated Provider Care Teams.  These Care Teams include your primary Cardiologist (physician) and Advanced Practice Providers (APPs -  Physician Assistants and Nurse Practitioners) who all work together to provide you with the care you need, when you  need it.  We recommend signing up for the patient portal called "MyChart".  Sign up information is provided on this After Visit Summary.  MyChart is used to connect with patients for Virtual Visits (Telemedicine).  Patients are able to view lab/test results, encounter notes, upcoming appointments, etc.  Non-urgent  messages can be sent to your provider as well.   To learn more about what you can do with MyChart, go to [email or web address].    Your next appointment:   1 year  The format for your next appointment:   In Person  Provider:   Berniece Salines, DO   Other Instructions  Cardiac CT Angiogram A cardiac CT angiogram is a procedure to look at the heart and the area around the heart. It may be done to help find the cause of chest pains or other symptoms of heart disease. During this procedure, a substance called contrast dye is injected into the blood vessels in the area to be checked. A large X-ray machine, called a CT scanner, then takes detailed pictures of the heart and the surrounding area. The procedure is also sometimes called a coronary CT angiogram, coronary artery scanning, or CTA. A cardiac CT angiogram allows the health care provider to see how well blood is flowing to and from the heart. The health care provider will be able to see if there are any problems, such as:  Blockage or narrowing of the coronary arteries in the heart.  Fluid around the heart.  Signs of weakness or disease in the muscles, valves, and tissues of the heart. Tell a health care provider about:  Any allergies you have. This is especially important if you have had a previous allergic reaction to contrast dye.  All medicines you are taking, including vitamins, herbs, eye drops, creams, and over-the-counter medicines.  Any blood disorders you have.  Any surgeries you have had.  Any medical conditions you have.  Whether you are pregnant or may be pregnant.  Any anxiety disorders, chronic pain, or other conditions you have that may increase your stress or prevent you from lying still. What are the risks? Generally, this is a safe procedure. However, problems may occur, including:  Bleeding.  Infection.  Allergic reactions to medicines or dyes.  Damage to other structures or organs.  Kidney damage  from the contrast dye that is used.  Increased risk of cancer from radiation exposure. This risk is low. Talk with your health care provider about: ? The risks and benefits of testing. ? How you can receive the lowest dose of radiation. What happens before the procedure?  Wear comfortable clothing and remove any jewelry, glasses, dentures, and hearing aids.  Follow instructions from your health care provider about eating and drinking. This may include: ? For 12 hours before the procedure -- avoid caffeine. This includes tea, coffee, soda, energy drinks, and diet pills. Drink plenty of water or other fluids that do not have caffeine in them. Being well hydrated can prevent complications. ? For 4-6 hours before the procedure -- stop eating and drinking. The contrast dye can cause nausea, but this is less likely if your stomach is empty.  Ask your health care provider about changing or stopping your regular medicines. This is especially important if you are taking diabetes medicines, blood thinners, or medicines to treat problems with erections (erectile dysfunction). What happens during the procedure?   Hair on your chest may need to be removed so that small sticky patches called electrodes can be  placed on your chest. These will transmit information that helps to monitor your heart during the procedure.  An IV will be inserted into one of your veins.  You might be given a medicine to control your heart rate during the procedure. This will help to ensure that good images are obtained.  You will be asked to lie on an exam table. This table will slide in and out of the CT machine during the procedure.  Contrast dye will be injected into the IV. You might feel warm, or you may get a metallic taste in your mouth.  You will be given a medicine called nitroglycerin. This will relax or dilate the arteries in your heart.  The table that you are lying on will move into the CT machine tunnel for the  scan.  The person running the machine will give you instructions while the scans are being done. You may be asked to: ? Keep your arms above your head. ? Hold your breath. ? Stay very still, even if the table is moving.  When the scanning is complete, you will be moved out of the machine.  The IV will be removed. The procedure may vary among health care providers and hospitals. What can I expect after the procedure? After your procedure, it is common to have:  A metallic taste in your mouth from the contrast dye.  A feeling of warmth.  A headache from the nitroglycerin. Follow these instructions at home:  Take over-the-counter and prescription medicines only as told by your health care provider.  If you are told, drink enough fluid to keep your urine pale yellow. This will help to flush the contrast dye out of your body.  Most people can return to their normal activities right after the procedure. Ask your health care provider what activities are safe for you.  It is up to you to get the results of your procedure. Ask your health care provider, or the department that is doing the procedure, when your results will be ready.  Keep all follow-up visits as told by your health care provider. This is important. Contact a health care provider if:  You have any symptoms of allergy to the contrast dye. These include: ? Shortness of breath. ? Rash or hives. ? A racing heartbeat. Summary  A cardiac CT angiogram is a procedure to look at the heart and the area around the heart. It may be done to help find the cause of chest pains or other symptoms of heart disease.  During this procedure, a large X-ray machine, called a CT scanner, takes detailed pictures of the heart and the surrounding area after a contrast dye has been injected into blood vessels in the area.  Ask your health care provider about changing or stopping your regular medicines before the procedure. This is especially  important if you are taking diabetes medicines, blood thinners, or medicines to treat erectile dysfunction.  If you are told, drink enough fluid to keep your urine pale yellow. This will help to flush the contrast dye out of your body. This information is not intended to replace advice given to you by your health care provider. Make sure you discuss any questions you have with your health care provider. Document Revised: 10/03/2018 Document Reviewed: 10/03/2018 Elsevier Patient Education  [place].      Adopting a Healthy Lifestyle.  Know what a healthy weight is for you (roughly BMI <25) and aim to maintain this   Aim  for 7+ servings of fruits and vegetables daily   65-80+ fluid ounces of water or unsweet tea for healthy kidneys   Limit to max 1 drink of alcohol per day; avoid smoking/tobacco   Limit animal fats in diet for cholesterol and heart health - choose grass fed whenever available   Avoid highly processed foods, and foods high in saturated/trans fats   Aim for low stress - take time to unwind and care for your mental health   Aim for 150 min of moderate intensity exercise weekly for heart health, and weights twice weekly for bone health   Aim for 7-9 hours of sleep daily   When it comes to diets, agreement about the perfect plan isnt easy to find, even among the experts. Experts at the [employer] developed an idea known as the Healthy Eating Plate. Just imagine a plate divided into logical, healthy portions.   The emphasis is on diet quality:   Load up on vegetables and fruits - one-half of your plate: Aim for color and variety, and remember that potatoes dont count.   Go for whole grains - one-quarter of your plate: Whole wheat, barley, wheat berries, quinoa, oats, brown rice, and foods made with them. If you want pasta, go with whole wheat pasta.   Protein power - one-quarter of your plate: Fish, chicken, beans, and nuts are all  healthy, versatile protein sources. Limit red meat.   The diet, however, does go beyond the plate, offering a few other suggestions.   Use healthy plant oils, such as olive, canola, soy, corn, sunflower and peanut. Check the labels, and avoid partially hydrogenated oil, which have unhealthy trans fats.   If youre thirsty, drink water. Coffee and tea are good in moderation, but skip sugary drinks and limit milk and dairy products to one or two daily servings.   The type of carbohydrate in the diet is more important than the amount. Some sources of carbohydrates, such as vegetables, fruits, whole grains, and beans-are healthier than others.   Finally, stay active  Signed, Berniece Salines, DO  09/11/2019 2:25 PM    [facility]

## 2019-09-11 NOTE — Patient Instructions (Addendum)
Medication Instructions:  No medication changes. *If you need a refill on your cardiac medications before your next appointment, please call your pharmacy*   Lab Work: None ordered If you have labs (blood work) drawn today and your tests are completely normal, you will receive your results only by: Marland Kitchen MyChart Message (if you have MyChart) OR . A paper copy in the mail If you have any lab test that is abnormal or we need to change your treatment, we will call you to review the results.   Testing/Procedures: Your cardiac CT will be scheduled at:   [facility] [address] [place], [place] [address] [phone]  If scheduled at [facility], please arrive at the [facility] main entrance of [facility] 30 minutes prior to test start time. Proceed to the [facility] Radiology Department (first floor) to check-in and test prep.  Please follow these instructions carefully (unless otherwise directed):   On the Night Before the Test: . Be sure to Drink plenty of water. . Do not consume any caffeinated/decaffeinated beverages or chocolate 12 hours prior to your test. . Do not take any antihistamines 12 hours prior to your test.  On the Day of the Test: . Drink plenty of water. Do not drink any water within one hour of the test. . Do not eat any food 4 hours prior to the test. . You may take your regular medications prior to the test.  . Take metoprolol (Lopressor) two hours prior to test. . HOLD Furosemide/Hydrochlorothiazide morning of the test. . FEMALES- please wear underwire-free bra if available      After the Test: . Drink plenty of water. . After receiving IV contrast, you may experience a mild flushed feeling. This is normal. . On occasion, you may experience a mild rash up to 24 hours after the test. This is not dangerous. If this occurs, you can take Benadryl 25 mg and increase your fluid intake. . If you experience trouble breathing, this  can be serious. If it is severe call 911 IMMEDIATELY. If it is mild, please call our office. . If you take any of these medications: Glipizide/Metformin, Avandament, Glucavance, please do not take 48 hours after completing test unless otherwise instructed.   Once we have confirmed authorization from your insurance company, we will call you to set up a date and time for your test. Based on how quickly your insurance processes prior authorizations requests, please allow up to 4 weeks to be contacted for scheduling your Cardiac CT appointment. Be advised that routine Cardiac CT appointments could be scheduled as many as 8 weeks after your provider has ordered it.  For non-scheduling related questions, please contact the cardiac imaging nurse navigator should you have any questions/concerns: Marchia Bond, Cardiac Imaging Nurse Navigator Burley Saver, Interim Cardiac Imaging Nurse [place] [facility] Direct Office Dial: [phone]   For scheduling needs, including cancellations and rescheduling, please call Vivien Rota at [phone], option 3.      Follow-Up: At [facility], you and your health needs are our priority.  As part of our continuing mission to provide you with exceptional heart care, we have created designated Provider Care Teams.  These Care Teams include your primary Cardiologist (physician) and Advanced Practice Providers (APPs -  Physician Assistants and Nurse Practitioners) who all work together to provide you with the care you need, when you need it.  We recommend signing up for the patient portal called "MyChart".  Sign up information is provided on this After Visit Summary.  MyChart is used to connect with patients for Virtual Visits (Telemedicine).  Patients are able to view lab/test results, encounter notes, upcoming appointments, etc.  Non-urgent messages can be sent to your provider as well.   To learn more about what you can do with MyChart, go to  [email or web address].    Your next appointment:   1 year  The format for your next appointment:   In Person  Provider:   Berniece Salines, DO   Other Instructions  Cardiac CT Angiogram A cardiac CT angiogram is a procedure to look at the heart and the area around the heart. It may be done to help find the cause of chest pains or other symptoms of heart disease. During this procedure, a substance called contrast dye is injected into the blood vessels in the area to be checked. A large X-ray machine, called a CT scanner, then takes detailed pictures of the heart and the surrounding area. The procedure is also sometimes called a coronary CT angiogram, coronary artery scanning, or CTA. A cardiac CT angiogram allows the health care provider to see how well blood is flowing to and from the heart. The health care provider will be able to see if there are any problems, such as:  Blockage or narrowing of the coronary arteries in the heart.  Fluid around the heart.  Signs of weakness or disease in the muscles, valves, and tissues of the heart. Tell a health care provider about:  Any allergies you have. This is especially important if you have had a previous allergic reaction to contrast dye.  All medicines you are taking, including vitamins, herbs, eye drops, creams, and over-the-counter medicines.  Any blood disorders you have.  Any surgeries you have had.  Any medical conditions you have.  Whether you are pregnant or may be pregnant.  Any anxiety disorders, chronic pain, or other conditions you have that may increase your stress or prevent you from lying still. What are the risks? Generally, this is a safe procedure. However, problems may occur, including:  Bleeding.  Infection.  Allergic reactions to medicines or dyes.  Damage to other structures or organs.  Kidney damage from the contrast dye that is used.  Increased risk of cancer from radiation exposure. This risk is low.  Talk with your health care provider about: ? The risks and benefits of testing. ? How you can receive the lowest dose of radiation. What happens before the procedure?  Wear comfortable clothing and remove any jewelry, glasses, dentures, and hearing aids.  Follow instructions from your health care provider about eating and drinking. This may include: ? For 12 hours before the procedure -- avoid caffeine. This includes tea, coffee, soda, energy drinks, and diet pills. Drink plenty of water or other fluids that do not have caffeine in them. Being well hydrated can prevent complications. ? For 4-6 hours before the procedure -- stop eating and drinking. The contrast dye can cause nausea, but this is less likely if your stomach is empty.  Ask your health care provider about changing or stopping your regular medicines. This is especially important if you are taking diabetes medicines, blood thinners, or medicines to treat problems with erections (erectile dysfunction). What happens during the procedure?   Hair on your chest may need to be removed so that small sticky patches called electrodes can be placed on your chest. These will transmit information that helps to monitor your heart  during the procedure.  An IV will be inserted into one of your veins.  You might be given a medicine to control your heart rate during the procedure. This will help to ensure that good images are obtained.  You will be asked to lie on an exam table. This table will slide in and out of the CT machine during the procedure.  Contrast dye will be injected into the IV. You might feel warm, or you may get a metallic taste in your mouth.  You will be given a medicine called nitroglycerin. This will relax or dilate the arteries in your heart.  The table that you are lying on will move into the CT machine tunnel for the scan.  The person running the machine will give you instructions while the scans are being done. You may  be asked to: ? Keep your arms above your head. ? Hold your breath. ? Stay very still, even if the table is moving.  When the scanning is complete, you will be moved out of the machine.  The IV will be removed. The procedure may vary among health care providers and hospitals. What can I expect after the procedure? After your procedure, it is common to have:  A metallic taste in your mouth from the contrast dye.  A feeling of warmth.  A headache from the nitroglycerin. Follow these instructions at home:  Take over-the-counter and prescription medicines only as told by your health care provider.  If you are told, drink enough fluid to keep your urine pale yellow. This will help to flush the contrast dye out of your body.  Most people can return to their normal activities right after the procedure. Ask your health care provider what activities are safe for you.  It is up to you to get the results of your procedure. Ask your health care provider, or the department that is doing the procedure, when your results will be ready.  Keep all follow-up visits as told by your health care provider. This is important. Contact a health care provider if:  You have any symptoms of allergy to the contrast dye. These include: ? Shortness of breath. ? Rash or hives. ? A racing heartbeat. Summary  A cardiac CT angiogram is a procedure to look at the heart and the area around the heart. It may be done to help find the cause of chest pains or other symptoms of heart disease.  During this procedure, a large X-ray machine, called a CT scanner, takes detailed pictures of the heart and the surrounding area after a contrast dye has been injected into blood vessels in the area.  Ask your health care provider about changing or stopping your regular medicines before the procedure. This is especially important if you are taking diabetes medicines, blood thinners, or medicines to treat erectile  dysfunction.  If you are told, drink enough fluid to keep your urine pale yellow. This will help to flush the contrast dye out of your body. This information is not intended to replace advice given to you by your health care provider. Make sure you discuss any questions you have with your health care provider. Document Revised: 10/03/2018 Document Reviewed: 10/03/2018 Elsevier Patient Education  [employer].

## 2019-10-10 ENCOUNTER — Encounter: Payer: Self-pay | Admitting: Genetic Counselor

## 2019-10-18 ENCOUNTER — Telehealth: Payer: Self-pay | Admitting: Genetic Counselor

## 2019-10-18 NOTE — Telephone Encounter (Signed)
LM on VM that I was returning phone call based on the recall letter we sent out.  Left CB instructions.

## 2019-11-28 ENCOUNTER — Other Ambulatory Visit: Payer: Self-pay | Admitting: Family Medicine

## 2019-11-28 DIAGNOSIS — E038 Other specified hypothyroidism: Secondary | ICD-10-CM

## 2020-01-07 DIAGNOSIS — H2513 Age-related nuclear cataract, bilateral: Secondary | ICD-10-CM | POA: Diagnosis not present

## 2020-02-18 DIAGNOSIS — D485 Neoplasm of uncertain behavior of skin: Secondary | ICD-10-CM | POA: Diagnosis not present

## 2020-02-18 DIAGNOSIS — D225 Melanocytic nevi of trunk: Secondary | ICD-10-CM | POA: Diagnosis not present

## 2020-02-18 DIAGNOSIS — L821 Other seborrheic keratosis: Secondary | ICD-10-CM | POA: Diagnosis not present

## 2020-02-18 DIAGNOSIS — Z86018 Personal history of other benign neoplasm: Secondary | ICD-10-CM | POA: Diagnosis not present

## 2020-02-18 DIAGNOSIS — Z85828 Personal history of other malignant neoplasm of skin: Secondary | ICD-10-CM | POA: Diagnosis not present

## 2020-02-20 ENCOUNTER — Other Ambulatory Visit: Payer: Self-pay | Admitting: Family Medicine

## 2020-02-20 DIAGNOSIS — E038 Other specified hypothyroidism: Secondary | ICD-10-CM

## 2020-02-27 ENCOUNTER — Telehealth: Payer: Self-pay | Admitting: Family Medicine

## 2020-02-27 ENCOUNTER — Other Ambulatory Visit: Payer: Self-pay | Admitting: Family Medicine

## 2020-02-27 DIAGNOSIS — E785 Hyperlipidemia, unspecified: Secondary | ICD-10-CM

## 2020-02-27 DIAGNOSIS — E039 Hypothyroidism, unspecified: Secondary | ICD-10-CM

## 2020-02-27 DIAGNOSIS — Z0000 Encounter for general adult medical examination without abnormal findings: Secondary | ICD-10-CM

## 2020-02-27 NOTE — Telephone Encounter (Signed)
Please advise 

## 2020-02-27 NOTE — Telephone Encounter (Signed)
Order in.

## 2020-02-27 NOTE — Telephone Encounter (Signed)
Caller Kathryn Mcdaniel  Call Back @ [phone]  Pt Calling requesting lab order for cpe prior to cpe visit.

## 2020-02-28 NOTE — Telephone Encounter (Signed)
Called patient to get appt scheduled. LVM

## 2020-02-28 NOTE — Telephone Encounter (Signed)
Please schedule lab appt at her convenience. Thank you.

## 2020-02-29 ENCOUNTER — Other Ambulatory Visit: Payer: Self-pay | Admitting: Family Medicine

## 2020-02-29 DIAGNOSIS — E038 Other specified hypothyroidism: Secondary | ICD-10-CM

## 2020-03-02 ENCOUNTER — Other Ambulatory Visit: Payer: Self-pay

## 2020-03-02 ENCOUNTER — Other Ambulatory Visit (INDEPENDENT_AMBULATORY_CARE_PROVIDER_SITE_OTHER): Payer: BC Managed Care – PPO

## 2020-03-02 DIAGNOSIS — E039 Hypothyroidism, unspecified: Secondary | ICD-10-CM | POA: Diagnosis not present

## 2020-03-02 DIAGNOSIS — E785 Hyperlipidemia, unspecified: Secondary | ICD-10-CM | POA: Diagnosis not present

## 2020-03-02 DIAGNOSIS — Z0000 Encounter for general adult medical examination without abnormal findings: Secondary | ICD-10-CM

## 2020-03-02 LAB — COMPREHENSIVE METABOLIC PANEL
ALT: 32 U/L (ref 0–35)
AST: 24 U/L (ref 0–37)
Albumin: 4.5 g/dL (ref 3.5–5.2)
Alkaline Phosphatase: 55 U/L (ref 39–117)
BUN: 22 mg/dL (ref 6–23)
CO2: 32 mEq/L (ref 19–32)
Calcium: 9.4 mg/dL (ref 8.4–10.5)
Chloride: 101 mEq/L (ref 96–112)
Creatinine, Ser: 0.57 mg/dL (ref 0.40–1.20)
GFR: 97.55 mL/min (ref >60.00)
Glucose, Bld: 73 mg/dL (ref 70–99)
Potassium: 4.2 mEq/L (ref 3.5–5.1)
Sodium: 137 mEq/L (ref 135–145)
Total Bilirubin: 0.4 mg/dL (ref 0.2–1.2)
Total Protein: 6.6 g/dL (ref 6.0–8.3)

## 2020-03-02 LAB — CBC WITH DIFFERENTIAL/PLATELET
Basophils Absolute: 0 10*3/uL (ref 0.0–0.1)
Basophils Relative: 0.6 % (ref 0.0–3.0)
Eosinophils Absolute: 0.3 10*3/uL (ref 0.0–0.7)
Eosinophils Relative: 6.4 % — ABNORMAL HIGH (ref 0.0–5.0)
HCT: 40 % (ref 36.0–46.0)
Hemoglobin: 13.2 g/dL (ref 12.0–15.0)
Lymphocytes Relative: 29.3 % (ref 12.0–46.0)
Lymphs Abs: 1.3 10*3/uL (ref 0.7–4.0)
MCHC: 32.9 g/dL (ref 30.0–36.0)
MCV: 96 fl (ref 78.0–100.0)
Monocytes Absolute: 0.5 10*3/uL (ref 0.1–1.0)
Monocytes Relative: 10.3 % (ref 3.0–12.0)
Neutro Abs: 2.4 10*3/uL (ref 1.4–7.7)
Neutrophils Relative %: 53.4 % (ref 43.0–77.0)
Platelets: 208 10*3/uL (ref 150.0–400.0)
RBC: 4.17 Mil/uL (ref 3.87–5.11)
RDW: 12.8 % (ref 11.5–15.5)
WBC: 4.5 10*3/uL (ref 4.0–10.5)

## 2020-03-02 LAB — LIPID PANEL
Cholesterol: 195 mg/dL (ref 0–200)
HDL: 80.9 mg/dL (ref >39.00)
LDL Cholesterol: 99 mg/dL (ref 0–99)
NonHDL: 114.17
Total CHOL/HDL Ratio: 2
Triglycerides: 76 mg/dL (ref 0.0–149.0)
VLDL: 15.2 mg/dL (ref 0.0–40.0)

## 2020-03-02 LAB — TSH: TSH: 4.59 u[IU]/mL — ABNORMAL HIGH (ref 0.35–4.50)

## 2020-03-03 ENCOUNTER — Other Ambulatory Visit: Payer: Self-pay | Admitting: Family Medicine

## 2020-03-03 DIAGNOSIS — E039 Hypothyroidism, unspecified: Secondary | ICD-10-CM

## 2020-03-04 ENCOUNTER — Other Ambulatory Visit: Payer: Self-pay

## 2020-03-04 MED ORDER — LEVOTHYROXINE SODIUM 100 MCG PO TABS: 100.0000 ug | ORAL_TABLET | Freq: Every day | ORAL | 2 refills | Status: DC

## 2020-03-05 ENCOUNTER — Encounter: Payer: Self-pay | Admitting: Family Medicine

## 2020-03-05 ENCOUNTER — Ambulatory Visit (INDEPENDENT_AMBULATORY_CARE_PROVIDER_SITE_OTHER): Payer: BC Managed Care – PPO | Admitting: Family Medicine

## 2020-03-05 ENCOUNTER — Other Ambulatory Visit: Payer: Self-pay

## 2020-03-05 VITALS — BP 110/70 | HR 71 | Temp 98.3°F | Resp 18 | Ht 67.0 in | Wt 113.2 lb

## 2020-03-05 DIAGNOSIS — Z1211 Encounter for screening for malignant neoplasm of colon: Secondary | ICD-10-CM

## 2020-03-05 DIAGNOSIS — E039 Hypothyroidism, unspecified: Secondary | ICD-10-CM | POA: Diagnosis not present

## 2020-03-05 DIAGNOSIS — Z0000 Encounter for general adult medical examination without abnormal findings: Secondary | ICD-10-CM | POA: Diagnosis not present

## 2020-03-05 DIAGNOSIS — F419 Anxiety disorder, unspecified: Secondary | ICD-10-CM | POA: Diagnosis not present

## 2020-03-05 DIAGNOSIS — Z23 Encounter for immunization: Secondary | ICD-10-CM | POA: Diagnosis not present

## 2020-03-05 NOTE — Patient Instructions (Signed)
Preventive Care 84-63 Years Old, Female Preventive care refers to lifestyle choices and visits with your health care provider that can promote health and wellness. This includes:  A yearly physical exam. This is also called an annual wellness visit.  Regular dental and eye exams.  Immunizations.  Screening for certain conditions.  Healthy lifestyle choices, such as: ? Eating a healthy diet. ? Getting regular exercise. ? Not using drugs or products that contain nicotine and tobacco. ? Limiting alcohol use. What can I expect for my preventive care visit? Physical exam Your health care provider will check your:  Height and weight. These may be used to calculate your BMI (body mass index). BMI is a measurement that tells if you are at a healthy weight.  Heart rate and blood pressure.  Body temperature.  Skin for abnormal spots. Counseling Your health care provider may ask you questions about your:  Past medical problems.  Family's medical history.  Alcohol, tobacco, and drug use.  Emotional well-being.  Home life and relationship well-being.  Sexual activity.  Diet, exercise, and sleep habits.  Work and work Statistician.  Access to firearms.  Method of birth control.  Menstrual cycle.  Pregnancy history. What immunizations do I need? Vaccines are usually given at various ages, according to a schedule. Your health care provider will recommend vaccines for you based on your age, medical history, and lifestyle or other factors, such as travel or where you work.   What tests do I need? Blood tests  Lipid and cholesterol levels. These may be checked every 5 years, or more often if you are over 3 years old.  Hepatitis C test.  Hepatitis B test. Screening  Lung cancer screening. You may have this screening every year starting at age 73 if you have a 30-pack-year history of smoking and currently smoke or have quit within the past 15 years.  Colorectal cancer  screening. ? All adults should have this screening starting at age 52 and continuing until age 17. ? Your health care provider may recommend screening at age 49 if you are at increased risk. ? You will have tests every 1-10 years, depending on your results and the type of screening test.  Diabetes screening. ? This is done by checking your blood sugar (glucose) after you have not eaten for a while (fasting). ? You may have this done every 1-3 years.  Mammogram. ? This may be done every 1-2 years. ? Talk with your health care provider about when you should start having regular mammograms. This may depend on whether you have a family history of breast cancer.  BRCA-related cancer screening. This may be done if you have a family history of breast, ovarian, tubal, or peritoneal cancers.  Pelvic exam and Pap test. ? This may be done every 3 years starting at age 10. ? Starting at age 11, this may be done every 5 years if you have a Pap test in combination with an HPV test. Other tests  STD (sexually transmitted disease) testing, if you are at risk.  Bone density scan. This is done to screen for osteoporosis. You may have this scan if you are at high risk for osteoporosis. Talk with your health care provider about your test results, treatment options, and if necessary, the need for more tests. Follow these instructions at home: Eating and drinking  Eat a diet that includes fresh fruits and vegetables, whole grains, lean protein, and low-fat dairy products.  Take vitamin and mineral supplements  as recommended by your health care provider.  Do not drink alcohol if: ? Your health care provider tells you not to drink. ? You are pregnant, may be pregnant, or are planning to become pregnant.  If you drink alcohol: ? Limit how much you have to 0-1 drink a day. ? Be aware of how much alcohol is in your drink. In the [place] one drink equals one 12 oz bottle of beer (355 mL), one 5 oz glass of  wine (148 mL), or one 1 oz glass of hard liquor (44 mL).   Lifestyle  Take daily care of your teeth and gums. Brush your teeth every morning and night with fluoride toothpaste. Floss one time each day.  Stay active. Exercise for at least 30 minutes 5 or more days each week.  Do not use any products that contain nicotine or tobacco, such as cigarettes, e-cigarettes, and chewing tobacco. If you need help quitting, ask your health care provider.  Do not use drugs.  If you are sexually active, practice safe sex. Use a condom or other form of protection to prevent STIs (sexually transmitted infections).  If you do not wish to become pregnant, use a form of birth control. If you plan to become pregnant, see your health care provider for a prepregnancy visit.  If told by your health care provider, take low-dose aspirin daily starting at age 50.  Find healthy ways to cope with stress, such as: ? Meditation, yoga, or listening to music. ? Journaling. ? Talking to a trusted person. ? Spending time with friends and family. Safety  Always wear your seat belt while driving or riding in a vehicle.  Do not drive: ? If you have been drinking alcohol. Do not ride with someone who has been drinking. ? When you are tired or distracted. ? While texting.  Wear a helmet and other protective equipment during sports activities.  If you have firearms in your house, make sure you follow all gun safety procedures. What's next?  Visit your health care provider once a year for an annual wellness visit.  Ask your health care provider how often you should have your eyes and teeth checked.  Stay up to date on all vaccines. This information is not intended to replace advice given to you by your health care provider. Make sure you discuss any questions you have with your health care provider. Document Revised: 11/12/2019 Document Reviewed: 10/19/2017 Elsevier Patient Education  2021 Elsevier Inc.  

## 2020-03-05 NOTE — Progress Notes (Signed)
Subjective:     Kathryn Mcdaniel is a 65 y.o. female and is here for a comprehensive physical exam. The patient reports no problems.  Social History   Socioeconomic History  . Marital status: Single    Spouse name: Not on file  . Number of children: Not on file  . Years of education: Not on file  . Highest education level: Not on file  Occupational History  . Occupation: self employed  Tobacco Use  . Smoking status: Former Smoker    Quit date: 09/15/1984    Years since quitting: 35.5  . Smokeless tobacco: Never Used  Substance and Sexual Activity  . Alcohol use: Yes    Alcohol/week: 14.0 standard drinks    Types: 14 Cans of beer per week    Comment: daily  . Drug use: No  . Sexual activity: Not on file  Other Topics Concern  . Not on file  Social History Narrative   Single   Work; Therapist, occupational, has a dog walking business   Exercise-- swim, walking dogs and bike   Social Determinants of Radio broadcast assistant Strain: Not on file  Food Insecurity: Not on file  Transportation Needs: Not on file  Physical Activity: Not on file  Stress: Not on file  Social Connections: Not on file  Intimate Partner Violence: Not on file   Health Maintenance  Topic Date Due  . HIV Screening  Never done  . PAP SMEAR-Modifier  09/20/2019  . INFLUENZA VACCINE  09/22/2019  . COLONOSCOPY (Pts 45-11yrs Insurance coverage will need to be confirmed)  01/22/2020  . COVID-19 Vaccine (4 - Booster for Pfizer series) 07/22/2020  . MAMMOGRAM  08/01/2021  . TETANUS/TDAP  03/03/2029  . 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 She does not have any pertinent problems on file. She  has a past surgical history that includes Breast surgery; Breast biopsy; Nasal sinus surgery; Mass excision (Right, 08/09/2013); and Mastectomy (Right). Her family history includes Breast cancer in her mother and paternal grandmother; Cancer in her brother, father, and another family member; Diabetes in her father; Hypertension in her father; Leukemia in her sister; Squamous cell carcinoma in her mother; Stroke in her maternal grandmother; Transient ischemic attack in her mother. She  reports that she quit smoking about 35 years ago. She has never used smokeless tobacco. She reports current alcohol use of about 14.0 standard drinks of alcohol per week. She reports that she does not use drugs. She has a current medication list which includes the following prescription(s): alprazolam, vitamin c, aspirin ec, calcium carbonate-vitamin d, cholecalciferol, fluoxetine, levothyroxine, multivitamin, NONFORMULARY OR COMPOUNDED ITEM, and vitamin e. Current Outpatient Medications on File Prior to Visit  Medication Sig Dispense Refill  . ALPRAZolam (XANAX) 0.5 MG tablet Take 1 tablet (0.5 mg total) by mouth at bedtime as needed. 30 tablet 0  . Ascorbic Acid (VITAMIN C) 1000 MG tablet Take 1,000 mg by mouth daily.    Marland Kitchen aspirin EC 81 MG tablet Take 81 mg by mouth daily.    Marland Kitchen  Calcium Carbonate-Vitamin D (CALTRATE 600+D) 600-400 MG-UNIT per tablet Take 1 tablet by mouth 2 (two) times daily.    . cholecalciferol (VITAMIN D) 1000 UNITS tablet Take 1,000 Units by mouth daily.    Marland Kitchen FLUoxetine (PROZAC) 40 MG capsule Take 40 mg by mouth 2 (two) times daily.    Marland Kitchen levothyroxine (SYNTHROID) 100 MCG tablet Take 1 tablet (100 mcg total) by mouth daily. 30 tablet 2  . Multiple Vitamin (MULTIVITAMIN) tablet  Take 1 tablet by mouth daily.    . NONFORMULARY OR COMPOUNDED ITEM Compression socks #1  As directed 1 each 1  . vitamin E 400 UNIT capsule Take 400 Units by mouth daily.     No current facility-administered medications on file prior to visit.   She is allergic to compazine [prochlorperazine edisylate] and metoclopramide hcl..  Review of Systems Review of Systems  Constitutional: Negative for activity change, appetite change and fatigue.  HENT: Negative for hearing loss, congestion, tinnitus and ear discharge.  dentist q37m Eyes: Negative for visual disturbance (see optho q1y -- vision corrected to 20/20 with glasses).  Respiratory: Negative for cough, chest tightness and shortness of breath.   Cardiovascular: Negative for chest pain, palpitations and leg swelling.  Gastrointestinal: Negative for abdominal pain, diarrhea, constipation and abdominal distention.  Genitourinary: Negative for urgency, frequency, decreased urine volume and difficulty urinating.  Musculoskeletal: Negative for back pain, arthralgias and gait problem.  Skin: Negative for color change, pallor and rash.  Neurological: Negative for dizziness, light-headedness, numbness and headaches.  Hematological: Negative for adenopathy. Does not bruise/bleed easily.  Psychiatric/Behavioral: Negative for suicidal ideas, confusion, sleep disturbance, self-injury, dysphoric mood, decreased concentration and agitation.       Objective:    BP 110/70 (BP Location: Right Arm, Patient Position: Sitting, Cuff Size: Normal)   Pulse 71   Temp 98.3 F (36.8 C) (Oral)   Resp 18   Ht 5\' 7"  (1.702 m)   Wt 113 lb 3.2 oz (51.3 kg)   SpO2 100%   BMI 17.73 kg/m  General appearance: alert, cooperative, appears stated age and no distress Head: Normocephalic, without obvious abnormality, atraumatic Eyes: negative findings: lids and lashes normal, conjunctivae and sclerae normal and pupils equal, round, reactive to light and  accomodation Ears: normal TM's and external ear canals both ears Neck: no adenopathy, no carotid bruit, no JVD, supple, symmetrical, trachea midline and thyroid not enlarged, symmetric, no tenderness/mass/nodules Back: symmetric, no curvature. ROM normal. No CVA tenderness. Lungs: clear to auscultation bilaterally Breasts: gyn Heart: regular rate and rhythm, S1, S2 normal, no murmur, click, rub or gallop Abdomen: soft, non-tender; bowel sounds normal; no masses,  no organomegaly Pelvic: deferred--gyn Extremities: extremities normal, atraumatic, no cyanosis or edema Pulses: 2+ and symmetric Skin: Skin color, texture, turgor normal. No rashes or lesions Lymph nodes: Cervical, supraclavicular, and axillary nodes normal. Neurologic: Grossly normal    Assessment:    Healthy female exam.      Plan:    ghm utd Check labs  See After Visit Summary for Counseling Recommendations     1. Preventative health care See above Labs reviewed  - TSH; Future  2. Need for influenza vaccination   - Flu Vaccine QUAD 36+ mos IM  3. Colon cancer screening   - Ambulatory referral to Gastroenterology  4. Anxiety Stable con't meds   5. Hypothyroidism, unspecified type Lab Results  Component Value Date   TSH 4.59 (H) 03/02/2020   Inc synthroid 100 mcg daily

## 2020-04-06 DIAGNOSIS — D225 Melanocytic nevi of trunk: Secondary | ICD-10-CM | POA: Diagnosis not present

## 2020-04-06 DIAGNOSIS — D485 Neoplasm of uncertain behavior of skin: Secondary | ICD-10-CM | POA: Diagnosis not present

## 2020-05-19 ENCOUNTER — Other Ambulatory Visit: Payer: Self-pay

## 2020-05-19 ENCOUNTER — Other Ambulatory Visit (INDEPENDENT_AMBULATORY_CARE_PROVIDER_SITE_OTHER): Payer: BC Managed Care – PPO

## 2020-05-19 DIAGNOSIS — Z0000 Encounter for general adult medical examination without abnormal findings: Secondary | ICD-10-CM

## 2020-05-20 LAB — TSH: TSH: 2.31 u[IU]/mL (ref 0.35–4.50)

## 2020-05-26 ENCOUNTER — Other Ambulatory Visit: Payer: Self-pay | Admitting: Family Medicine

## 2020-06-17 ENCOUNTER — Other Ambulatory Visit: Payer: Self-pay | Admitting: Family Medicine

## 2020-07-01 ENCOUNTER — Other Ambulatory Visit: Payer: Self-pay | Admitting: Family Medicine

## 2020-07-01 DIAGNOSIS — Z1231 Encounter for screening mammogram for malignant neoplasm of breast: Secondary | ICD-10-CM

## 2020-07-18 ENCOUNTER — Other Ambulatory Visit: Payer: Self-pay | Admitting: Family Medicine

## 2020-08-19 ENCOUNTER — Other Ambulatory Visit: Payer: Self-pay | Admitting: Family Medicine

## 2020-08-28 ENCOUNTER — Encounter: Payer: Self-pay | Admitting: Family Medicine

## 2020-08-28 ENCOUNTER — Ambulatory Visit (INDEPENDENT_AMBULATORY_CARE_PROVIDER_SITE_OTHER): Payer: BC Managed Care – PPO | Admitting: Family Medicine

## 2020-08-28 ENCOUNTER — Other Ambulatory Visit: Payer: Self-pay

## 2020-08-28 VITALS — BP 120/72 | HR 69 | Temp 98.4°F | Resp 18 | Ht 67.0 in | Wt 113.6 lb

## 2020-08-28 DIAGNOSIS — R350 Frequency of micturition: Secondary | ICD-10-CM | POA: Diagnosis not present

## 2020-08-28 LAB — POC URINALSYSI DIPSTICK (AUTOMATED)
Bilirubin, UA: NEGATIVE
Blood, UA: NEGATIVE
Glucose, UA: NEGATIVE
Ketones, UA: NEGATIVE
Leukocytes, UA: NEGATIVE
Nitrite, UA: NEGATIVE
Protein, UA: NEGATIVE
Spec Grav, UA: 1.01 (ref 1.010–1.025)
Urobilinogen, UA: 0.2 E.U./dL
pH, UA: 7.5 (ref 5.0–8.0)

## 2020-08-28 MED ORDER — CEPHALEXIN 500 MG PO CAPS: 500.0000 mg | ORAL_CAPSULE | Freq: Two times a day (BID) | ORAL | 0 refills | Status: DC

## 2020-08-28 NOTE — Patient Instructions (Signed)
Dysuria ?Dysuria is pain or discomfort during urination. The pain or discomfort may be felt in the part of the body that drains urine from the bladder (urethra) or in the surrounding tissue of the genitals. The pain may also be felt in the groin area, lower abdomen, or lower back. ?You may have to urinate frequently or have the sudden feeling that you have to urinate (urgency). Dysuria can affect anyone, but it is more common in females. Dysuria can be caused by many different things, including: ?Urinary tract infection. ?Kidney stones or bladder stones. ?Certain STIs (sexually transmitted infections), such as chlamydia. ?Dehydration. ?Inflammation of the tissues of the vagina. ?Use of certain medicines. ?Use of certain soaps or scented products that cause irritation. ?Follow these instructions at home: ?Medicines ?Take over-the-counter and prescription medicines only as told by your health care provider. ?If you were prescribed an antibiotic medicine, take it as told by your health care provider. Do not stop taking the antibiotic even if you start to feel better. ?Eating and drinking ? ?Drink enough fluid to keep your urine pale yellow. ?Avoid caffeinated beverages, tea, and alcohol. These beverages can irritate the bladder and make dysuria worse. In males, alcohol may irritate the prostate. ?General instructions ?Watch your condition for any changes. ?Urinate often. Avoid holding urine for long periods of time. ?If you are female, you should wipe from front to back after urinating or having a bowel movement. Use each piece of toilet paper only once. ?Empty your bladder after sex. ?Keep all follow-up visits. This is important. ?If you had any tests done to find the cause of dysuria, it is up to you to get your test results. Ask your health care provider, or the department that is doing the test, when your results will be ready. ?Contact a health care provider if: ?You have a fever. ?You develop pain in your back or  sides. ?You have nausea or vomiting. ?You have blood in your urine. ?You are not urinating as often as you usually do. ?Get help right away if: ?Your pain is severe and not relieved with medicines. ?You cannot eat or drink without vomiting. ?You are confused. ?You have a rapid heartbeat while resting. ?You have shaking or chills. ?You feel extremely weak. ?Summary ?Dysuria is pain or discomfort while urinating. Many different conditions can lead to dysuria. ?If you have dysuria, you may have to urinate frequently or have the sudden feeling that you have to urinate (urgency). ?Watch your condition for any changes. Keep all follow-up visits. ?Make sure that you urinate often and drink enough fluid to keep your urine pale yellow. ?This information is not intended to replace advice given to you by your health care provider. Make sure you discuss any questions you have with your health care provider. ?Document Revised: 09/20/2019 Document Reviewed: 09/20/2019 ?Elsevier Patient Education ? 2022 [employer] ? ?

## 2020-08-28 NOTE — Assessment & Plan Note (Signed)
ua neg Culture pending  abx sent in in case symptoms worsened over the weekend

## 2020-08-28 NOTE — Progress Notes (Signed)
Patient ID: Kathryn Mcdaniel, female    DOB: 08-08-1955  Age: 65 y.o. MRN: [id number]    Subjective:  Subjective  HPI Kathryn Mcdaniel presents for an office visit today. She complains of intermittent UTI sxs, frequency, malodorous urine, and dysuria.  She denies any flank pain or lower abdominal pain. She endorses drinking plenty of water. POCT Urinalysis Dipstick was performed at the office today and was within normal ranges.  She denies any chest pain, SOB, fever, cough, chills, sore throat, back pain, HA, or N/V/D at this time.   Review of Systems  Constitutional:  Negative for chills, fatigue and fever.  HENT:  Negative for ear pain, rhinorrhea, sinus pressure, sinus pain, sore throat and tinnitus.   Eyes:  Negative for pain.  Respiratory:  Negative for cough, shortness of breath and wheezing.   Cardiovascular:  Negative for chest pain.  Gastrointestinal:  Negative for abdominal pain, anal bleeding, constipation, diarrhea, nausea and vomiting.  Genitourinary:  Positive for dysuria and frequency. Negative for flank pain.       (+) malodorous urine    Musculoskeletal:  Negative for back pain and neck pain.  Skin:  Negative for rash.  Neurological:  Negative for seizures, weakness, light-headedness, numbness and headaches.   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   Her family history includes Breast cancer in her mother and paternal grandmother; Cancer in her brother, father, and another family member; Diabetes in her father; Hypertension in her father; Leukemia in her sister; Squamous cell carcinoma in her mother; Stroke in her maternal grandmother; Transient ischemic attack in her mother.She reports that she quit smoking about 35 years ago. She has never used smokeless tobacco. She reports current alcohol use of about 14.0 standard drinks of alcohol per week. She reports that she does not use drugs.  Current Outpatient Medications on File Prior to Visit  Medication Sig Dispense Refill   ALPRAZolam (XANAX) 0.5 MG tablet Take 1 tablet (0.5 mg total) by mouth at bedtime as needed. 30 tablet 0   Ascorbic Acid (VITAMIN C) 1000 MG tablet Take 1,000 mg by mouth daily.     aspirin EC 81 MG tablet Take 81 mg by mouth daily.     Calcium Carbonate-Vitamin D (CALTRATE 600+D) 600-400 MG-UNIT per tablet Take 1 tablet by mouth 2 (two) times daily.     cholecalciferol (VITAMIN D) 1000 UNITS tablet Take 1,000 Units by mouth daily.     FLUoxetine (PROZAC) 40 MG capsule Take 40 mg by mouth 2 (two) times daily.     levothyroxine (SYNTHROID) 100 MCG tablet TAKE 1 TABLET BY MOUTH EVERY DAY BEFORE BREAKFAST 90 tablet 0   Multiple Vitamin (MULTIVITAMIN) tablet Take 1 tablet by mouth daily.     NONFORMULARY OR COMPOUNDED ITEM Compression socks #  1  As directed 1 each 1   vitamin E 400 UNIT capsule Take 400 Units by mouth daily.     No current facility-administered medications on file prior to visit.     Objective:  Objective  Physical Exam Vitals and nursing note reviewed.  Constitutional:      General: She is not in acute distress.    Appearance: Normal appearance. She is well-developed. She is not ill-appearing.  HENT:     Head: Normocephalic and atraumatic.     Right Ear: External ear normal.     Left Ear: External ear  normal.     Nose: Nose normal.  Eyes:     General:        Right eye: No discharge.        Left eye: No discharge.     Extraocular Movements: Extraocular movements intact.     Pupils: Pupils are equal, round, and reactive to light.  Cardiovascular:     Rate and Rhythm: Normal rate and regular rhythm.     Pulses: Normal pulses.     Heart sounds: Normal heart sounds. No murmur heard.   No friction rub. No gallop.  Pulmonary:     Effort: Pulmonary effort is normal. No respiratory distress.     Breath sounds: Normal breath sounds. No stridor. No wheezing, rhonchi or rales.  Chest:     Chest wall: No tenderness.  Abdominal:     General: Bowel sounds are normal. There is no distension.     Palpations: Abdomen is soft. There is no mass.     Tenderness: There is no abdominal tenderness. There is no guarding or rebound.     Hernia: No hernia is present.  Musculoskeletal:        General: Normal range of motion.     Cervical back: Normal range of motion and neck supple.     Right lower leg: No edema.     Left lower leg: No edema.  Skin:    General: Skin is warm and dry.  Neurological:     Mental Status: She is alert and oriented to person, place, and time.  Psychiatric:        Behavior: Behavior normal.        Thought Content: Thought content normal.   BP 120/72 (BP Location: Left Arm, Patient Position: Sitting, Cuff Size: Normal)   Pulse 69   Temp 98.4 F (36.9 C) (Oral)   Resp 18   Ht 5\' 7"  (1.702 m)   Wt 113 lb 9.6 oz (51.5 kg)   SpO2 95%   BMI 17.79 kg/m  Wt Readings from Last 3 Encounters:  08/28/20 113 lb 9.6 oz (51.5 kg)  03/05/20 113 lb 3.2 oz (51.3 kg)  09/11/19 112 lb (50.8 kg)     Lab Results  Component Value Date   WBC 4.5 03/02/2020   HGB 13.2 03/02/2020   HCT 40.0 03/02/2020   PLT 208.0 03/02/2020   GLUCOSE 73 03/02/2020   CHOL 195 03/02/2020   TRIG 76.0 03/02/2020   HDL 80.90 03/02/2020   LDLCALC 99 03/02/2020   ALT 32 03/02/2020   AST 24 03/02/2020    NA 137 03/02/2020   K 4.2 03/02/2020   CL 101 03/02/2020   CREATININE 0.57 03/02/2020   BUN 22 03/02/2020   CO2 32 03/02/2020   TSH 2.31 05/19/2020   INR 1.2 01/02/2006    MM 3D SCREEN BREAST UNI LEFT  Result Date: 08/06/2019 CLINICAL DATA:  Screening. EXAM:  DIGITAL SCREENING UNILATERAL LEFT MAMMOGRAM WITH CAD AND TOMO COMPARISON:  Previous exam(s). ACR Breast Density Category d: The breast tissue is extremely dense, which lowers the sensitivity of mammography. FINDINGS: The patient has had a right mastectomy. There are no findings suspicious for malignancy. Images were processed with CAD. IMPRESSION: No mammographic evidence of malignancy. A result letter of this screening mammogram will be mailed directly to the patient. RECOMMENDATION: Screening mammogram in one year.  (Code:SM-L-33M) BI-RADS CATEGORY  1: Negative. Electronically Signed   By: Kristopher Oppenheim M.D.   On: 08/06/2019 13:48     Assessment & Plan:  Plan    Meds ordered this encounter  Medications   cephALEXin (KEFLEX) 500 MG capsule    Sig: Take 1 capsule (500 mg total) by mouth 2 (two) times daily.    Dispense:  20 capsule    Refill:  0    Problem List Items Addressed This Visit   None Visit Diagnoses     Urinary frequency    -  Primary   Relevant Medications   cephALEXin (KEFLEX) 500 MG capsule   Other Relevant Orders   POCT Urinalysis Dipstick (Automated) (Completed)   Urine Culture       Follow-up: Return if symptoms worsen or fail to improve.   I,Gordon Zheng,acting as a Education administrator for [employer], DO.,have documented all relevant documentation on the behalf of Kathryn Held, DO,as directed by  Kathryn Held, DO while in the presence of [place], DO, have reviewed all documentation for this visit. The documentation on 08/28/20 for the exam, diagnosis, procedures, and orders are all accurate and complete.

## 2020-08-29 LAB — URINE CULTURE
MICRO NUMBER:: 12097142
Result:: NO GROWTH
SPECIMEN QUALITY:: ADEQUATE

## 2020-08-31 ENCOUNTER — Ambulatory Visit
Admission: RE | Admit: 2020-08-31 | Discharge: 2020-08-31 | Disposition: A | Discharge status: Home / Self Care | Payer: BC Managed Care – PPO | Source: Ambulatory Visit | Attending: Family Medicine | Admitting: Family Medicine

## 2020-08-31 ENCOUNTER — Other Ambulatory Visit: Payer: Self-pay

## 2020-08-31 DIAGNOSIS — Z1231 Encounter for screening mammogram for malignant neoplasm of breast: Secondary | ICD-10-CM

## 2020-09-04 ENCOUNTER — Encounter: Payer: Self-pay | Admitting: Internal Medicine

## 2020-10-29 DIAGNOSIS — Z681 Body mass index (BMI) 19 or less, adult: Secondary | ICD-10-CM | POA: Diagnosis not present

## 2020-10-29 DIAGNOSIS — R87810 Cervical high risk human papillomavirus (HPV) DNA test positive: Secondary | ICD-10-CM | POA: Diagnosis not present

## 2020-10-29 DIAGNOSIS — Z124 Encounter for screening for malignant neoplasm of cervix: Secondary | ICD-10-CM | POA: Diagnosis not present

## 2020-10-29 DIAGNOSIS — Z01419 Encounter for gynecological examination (general) (routine) without abnormal findings: Secondary | ICD-10-CM | POA: Diagnosis not present

## 2020-11-04 DIAGNOSIS — R87810 Cervical high risk human papillomavirus (HPV) DNA test positive: Secondary | ICD-10-CM | POA: Diagnosis not present

## 2020-11-09 ENCOUNTER — Other Ambulatory Visit: Payer: Self-pay | Admitting: Obstetrics and Gynecology

## 2020-11-09 DIAGNOSIS — C50911 Malignant neoplasm of unspecified site of right female breast: Secondary | ICD-10-CM

## 2020-11-16 ENCOUNTER — Other Ambulatory Visit: Payer: Self-pay | Admitting: Family Medicine

## 2020-12-07 ENCOUNTER — Other Ambulatory Visit: Payer: Self-pay

## 2020-12-07 ENCOUNTER — Ambulatory Visit
Admission: RE | Admit: 2020-12-07 | Discharge: 2020-12-07 | Disposition: A | Discharge status: Home / Self Care | Payer: BC Managed Care – PPO | Source: Ambulatory Visit | Attending: Obstetrics and Gynecology | Admitting: Obstetrics and Gynecology

## 2020-12-07 DIAGNOSIS — C50911 Malignant neoplasm of unspecified site of right female breast: Secondary | ICD-10-CM

## 2020-12-07 DIAGNOSIS — N6489 Other specified disorders of breast: Secondary | ICD-10-CM | POA: Diagnosis not present

## 2020-12-07 MED ORDER — GADOBUTROL 1 MMOL/ML IV SOLN
5.0000 mL | Freq: Once | INTRAVENOUS | Status: AC | PRN
  Administered 2020-12-07: 5 mL via INTRAVENOUS

## 2020-12-08 ENCOUNTER — Encounter: Payer: BC Managed Care – PPO | Admitting: Internal Medicine

## 2021-02-08 ENCOUNTER — Encounter: Payer: Self-pay | Admitting: Family Medicine

## 2021-02-10 DIAGNOSIS — Z85828 Personal history of other malignant neoplasm of skin: Secondary | ICD-10-CM | POA: Diagnosis not present

## 2021-02-10 DIAGNOSIS — L821 Other seborrheic keratosis: Secondary | ICD-10-CM | POA: Diagnosis not present

## 2021-02-10 DIAGNOSIS — Z86018 Personal history of other benign neoplasm: Secondary | ICD-10-CM | POA: Diagnosis not present

## 2021-02-10 DIAGNOSIS — D2261 Melanocytic nevi of right upper limb, including shoulder: Secondary | ICD-10-CM | POA: Diagnosis not present

## 2021-02-15 ENCOUNTER — Other Ambulatory Visit: Payer: Self-pay | Admitting: Family Medicine

## 2021-04-15 ENCOUNTER — Ambulatory Visit (INDEPENDENT_AMBULATORY_CARE_PROVIDER_SITE_OTHER): Payer: BC Managed Care – PPO | Admitting: Family Medicine

## 2021-04-15 ENCOUNTER — Encounter: Payer: Self-pay | Admitting: Family Medicine

## 2021-04-15 VITALS — BP 124/88 | HR 67 | Temp 98.0°F | Resp 16 | Ht 67.0 in | Wt 113.4 lb

## 2021-04-15 DIAGNOSIS — Z1211 Encounter for screening for malignant neoplasm of colon: Secondary | ICD-10-CM

## 2021-04-15 DIAGNOSIS — Z0000 Encounter for general adult medical examination without abnormal findings: Secondary | ICD-10-CM

## 2021-04-15 DIAGNOSIS — E2839 Other primary ovarian failure: Secondary | ICD-10-CM

## 2021-04-15 DIAGNOSIS — E039 Hypothyroidism, unspecified: Secondary | ICD-10-CM | POA: Diagnosis not present

## 2021-04-15 NOTE — Progress Notes (Signed)
Subjective:   By signing my name below, I, Kathryn Mcdaniel, attest that this documentation has been prepared under the direction and in the presence of [name], DO  04/15/2021    Patient ID: Kathryn Mcdaniel, female    DOB: 1955-05-16, 66 y.o.   MRN: [id number]  Chief Complaint  Patient presents with   Annual Exam    Pt states fasting     HPI Patient is in today for a comprehensive physical exam.  She is requesting to check her thyroid levels during her lab work during this visit.  She is interested in completing her bone density scan during her next mammogram. She continues seeing her GYN specialist and she completes her pap smear.  She denies having any fever, ear pain, muscle pain, new joint pain, new moles, congestion, sinus pain, sore throat, eye pain, chest pain, palpations, cough, SOB, wheezing, n/v/d, constipation, blood in stool, dysuria, frequency, hematuria, or headaches at this time.  She is not interested in the shingles vaccine. She reports having Covid-19 at the end of November, 2022 and is interested in receiving the bivalent Covid-19 vaccine at the end of this month. She is not interested in receiving a hepatitis screening during this lab work.  Her older sister was diagnosed with ductal breast cancer at age 39 and received a lumpectomy last week, otherwise she has no other changes to her family medical history.   She is due for a colonoscopy and is interested in scheduling an appointment.    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), unspecified site    infiltrating ductal,right breast   Personal history of cervical dysplasia    Personal history of chemotherapy    Personal history of radiation therapy    Thyroid disease     Unspecified personal history presenting hazards to health    Radiation Therapy    Past Surgical History:  Procedure Laterality Date   BREAST BIOPSY Right 2007   BREAST SURGERY     right mastectomy   MASS EXCISION Right 08/09/2013   Procedure: MINOR EXCISION OF MASS CHEST WALL;  Surgeon: [name], MD;  Location: [facility];  Service: General;  Laterality: Right;   MASTECTOMY Right 2007   NASAL SINUS SURGERY     sinus stripping    Family History  Problem Relation Age of Onset   Breast cancer Mother    Squamous cell carcinoma Mother        sinus   Transient ischemic attack Mother    Cancer Father        prostate   Hypertension Father    Diabetes Father    Leukemia Sister    Breast cancer Sister    Cancer Brother    Stroke Maternal Grandmother    Breast cancer Paternal Grandmother    Cancer Other        grandmother, breast cancer   Colon cancer Neg Hx    Coronary artery disease Neg Hx     Social History   Socioeconomic History   Marital status: Single    Spouse name: Not on file   Number of children: Not on file   Years of education: Not on file  Highest education level: Not on file  Occupational History   Occupation: self employed  Tobacco Use   Smoking status: Former    Types: Cigarettes    Quit date: 09/15/1984    Years since quitting: 36.6   Smokeless tobacco: Never  Substance and Sexual Activity   Alcohol use: Yes    Alcohol/week: 14.0 standard drinks    Types: 14 Cans of beer per week    Comment: daily   Drug use: No   Sexual activity: Not on file  Other Topics Concern   Not on file  Social History Narrative   Single   Work; Therapist, occupational, has a dog walking business   Exercise-- swim, walking dogs and bike   Social Determinants of Radio broadcast assistant Strain: Not on file  Food Insecurity: Not on file  Transportation Needs: Not on file  Physical Activity: Not on file  Stress: Not on file  Social  Connections: Not on file  Intimate Partner Violence: Not on file    Outpatient Medications Prior to Visit  Medication Sig Dispense Refill   ALPRAZolam (XANAX) 0.5 MG tablet Take 1 tablet (0.5 mg total) by mouth at bedtime as needed. 30 tablet 0   Ascorbic Acid (VITAMIN C) 1000 MG tablet Take 1,000 mg by mouth daily.     aspirin EC 81 MG tablet Take 81 mg by mouth daily.     Calcium Carbonate-Vitamin D (CALTRATE 600+D) 600-400 MG-UNIT per tablet Take 1 tablet by mouth 2 (two) times daily.     cholecalciferol (VITAMIN D) 1000 UNITS tablet Take 1,000 Units by mouth daily.     FLUoxetine (PROZAC) 40 MG capsule Take 40 mg by mouth 2 (two) times daily.     levothyroxine (SYNTHROID) 100 MCG tablet TAKE 1 TABLET BY MOUTH EVERY DAY BEFORE BREAKFAST 90 tablet 0   Multiple Vitamin (MULTIVITAMIN) tablet Take 1 tablet by mouth daily.     NONFORMULARY OR COMPOUNDED ITEM Compression socks #1  As directed 1 each 1   vitamin E 400 UNIT capsule Take 400 Units by mouth daily.     cephALEXin (KEFLEX) 500 MG capsule Take 1 capsule (500 mg total) by mouth 2 (two) times daily. 20 capsule 0   No facility-administered medications prior to visit.    Allergies  Allergen Reactions   Compazine [Prochlorperazine Edisylate]     "bizarre, dystonic"   Metoclopramide Hcl     Review of Systems  Constitutional:  Negative for fever.  HENT:  Negative for congestion and sore throat.   Respiratory:  Negative for cough, shortness of breath and wheezing.   Cardiovascular:  Negative for chest pain.  Gastrointestinal:  Negative for blood in stool, constipation, diarrhea, nausea and vomiting.  Genitourinary:  Negative for dysuria, frequency and hematuria.  Musculoskeletal:  Negative for joint pain and myalgias.  Skin:        (-)New moles  Neurological:  Negative for headaches.      Objective:    Physical Exam Constitutional:      General: She is not in acute distress.    Appearance: Normal appearance. She is not  ill-appearing.  HENT:     Head: Normocephalic and atraumatic.     Right Ear: Tympanic membrane, ear canal and external ear normal.     Left Ear: Tympanic membrane, ear canal and external ear normal.  Eyes:     Extraocular Movements: Extraocular movements intact.     Pupils: Pupils are equal, round, and reactive to light.  Cardiovascular:     Rate and Rhythm: Normal rate and regular rhythm.     Heart sounds: Normal heart sounds. No murmur heard.   No gallop.  Pulmonary:     Effort: Pulmonary effort is normal. No respiratory distress.     Breath sounds: Normal breath sounds. No wheezing or rales.  Abdominal:     General: Bowel sounds are normal. There is no distension.     Palpations: Abdomen is soft.     Tenderness: There is no abdominal tenderness. There is no guarding.  Skin:    General: Skin is warm and dry.  Neurological:     Mental Status: She is alert and oriented to person, place, and time.  Psychiatric:        Behavior: Behavior normal.    BP 124/88 (BP Location: Left Arm, Patient Position: Sitting, Cuff Size: Small)    Pulse 67    Temp 98 F (36.7 C) (Oral)    Resp 16    Ht 5\' 7"  (1.702 m)    Wt 113 lb 6.4 oz (51.4 kg)    SpO2 100%    BMI 17.76 kg/m  Wt Readings from Last 3 Encounters:  04/15/21 113 lb 6.4 oz (51.4 kg)  08/28/20 113 lb 9.6 oz (51.5 kg)  03/05/20 113 lb 3.2 oz (51.3 kg)    Diabetic Foot Exam - Simple   No data filed    Lab Results  Component Value Date   WBC 4.8 04/15/2021   HGB 11.9 (L) 04/15/2021   HCT 34.7 (L) 04/15/2021   PLT 169.0 04/15/2021   GLUCOSE 71 04/15/2021   CHOL 163 04/15/2021   TRIG 46.0 04/15/2021   HDL 76.20 04/15/2021   LDLCALC 78 04/15/2021   ALT 26 04/15/2021   AST 24 04/15/2021   NA 137 04/15/2021   K 4.1 04/15/2021   CL 101 04/15/2021   CREATININE 0.57 04/15/2021   BUN 17 04/15/2021   CO2 32 04/15/2021   TSH 3.21 04/15/2021   INR 1.2 01/02/2006    Lab Results  Component Value Date   TSH 3.21 04/15/2021    Lab Results  Component Value Date   WBC 4.8 04/15/2021   HGB 11.9 (L) 04/15/2021   HCT 34.7 (L) 04/15/2021   MCV 94.3 04/15/2021   PLT 169.0 04/15/2021   Lab Results  Component Value Date   NA 137 04/15/2021   K 4.1 04/15/2021   CO2 32 04/15/2021   GLUCOSE 71 04/15/2021   BUN 17 04/15/2021   CREATININE 0.57 04/15/2021   BILITOT 0.7 04/15/2021   ALKPHOS 55 04/15/2021   AST 24 04/15/2021   ALT 26 04/15/2021   PROT 6.3 04/15/2021   ALBUMIN 4.2 04/15/2021   CALCIUM 9.4 04/15/2021   ANIONGAP 6 03/08/2014   GFR 96.79 04/15/2021   Lab Results  Component Value Date   CHOL 163 04/15/2021   Lab Results  Component Value Date   HDL 76.20 04/15/2021   Lab Results  Component Value Date   LDLCALC 78 04/15/2021   Lab Results  Component Value Date   TRIG 46.0 04/15/2021   Lab Results  Component Value Date   CHOLHDL 2 04/15/2021   No results found for: HGBA1C  Mammogram- Last completed 09/02/2020. Results are normal. Repeat in 1 year.  Colonoscopy- Last completed 11/10/2009. Results showed external hemorrhoids in rectum, otherwise results are normal.  Pap smear- Last completed 09/19/2016.       Assessment & Plan:   Problem List Items Addressed  This Visit       Unprioritized   Hypothyroid    Check labs  con't synthroid      Relevant Orders   TSH (Completed)   Preventative health care - Primary    ghm utd Check labs       Relevant Orders   Comprehensive metabolic panel (Completed)   Lipid panel (Completed)   CBC with Differential/Platelet (Completed)   TSH (Completed)   Other Visit Diagnoses     Colon cancer screening       Relevant Orders   Ambulatory referral to Gastroenterology   Estrogen deficiency       Relevant Orders   DG Bone Density        No orders of the defined types were placed in this encounter.   I, [name], DO, personally preformed the services described in this documentation.  All medical record entries made  by the scribe were at my direction and in my presence.  I have reviewed the chart and discharge instructions (if applicable) and agree that the record reflects my personal performance and is accurate and complete. 04/15/2021   I,Kathryn Mcdaniel,acting as a scribe for [name], DO.,have documented all relevant documentation on the behalf of [name], DO,as directed by  [name], DO while in the presence of [name], DO.   [name], DO

## 2021-04-15 NOTE — Patient Instructions (Signed)

## 2021-04-16 LAB — CBC WITH DIFFERENTIAL/PLATELET
Basophils Absolute: 0 10*3/uL (ref 0.0–0.1)
Basophils Relative: 0.8 % (ref 0.0–3.0)
Eosinophils Absolute: 0.2 10*3/uL (ref 0.0–0.7)
Eosinophils Relative: 3.3 % (ref 0.0–5.0)
HCT: 34.7 % — ABNORMAL LOW (ref 36.0–46.0)
Hemoglobin: 11.9 g/dL — ABNORMAL LOW (ref 12.0–15.0)
Lymphocytes Relative: 30.5 % (ref 12.0–46.0)
Lymphs Abs: 1.5 10*3/uL (ref 0.7–4.0)
MCHC: 34.2 g/dL (ref 30.0–36.0)
MCV: 94.3 fl (ref 78.0–100.0)
Monocytes Absolute: 0.4 10*3/uL (ref 0.1–1.0)
Monocytes Relative: 7.7 % (ref 3.0–12.0)
Neutro Abs: 2.8 10*3/uL (ref 1.4–7.7)
Neutrophils Relative %: 57.7 % (ref 43.0–77.0)
Platelets: 169 10*3/uL (ref 150.0–400.0)
RBC: 3.68 Mil/uL — ABNORMAL LOW (ref 3.87–5.11)
RDW: 12.8 % (ref 11.5–15.5)
WBC: 4.8 10*3/uL (ref 4.0–10.5)

## 2021-04-16 LAB — LIPID PANEL
Cholesterol: 163 mg/dL (ref 0–200)
HDL: 76.2 mg/dL (ref >39.00)
LDL Cholesterol: 78 mg/dL (ref 0–99)
NonHDL: 87.22
Total CHOL/HDL Ratio: 2
Triglycerides: 46 mg/dL (ref 0.0–149.0)
VLDL: 9.2 mg/dL (ref 0.0–40.0)

## 2021-04-16 LAB — COMPREHENSIVE METABOLIC PANEL
ALT: 26 U/L (ref 0–35)
AST: 24 U/L (ref 0–37)
Albumin: 4.2 g/dL (ref 3.5–5.2)
Alkaline Phosphatase: 55 U/L (ref 39–117)
BUN: 17 mg/dL (ref 6–23)
CO2: 32 mEq/L (ref 19–32)
Calcium: 9.4 mg/dL (ref 8.4–10.5)
Chloride: 101 mEq/L (ref 96–112)
Creatinine, Ser: 0.57 mg/dL (ref 0.40–1.20)
GFR: 96.79 mL/min (ref >60.00)
Glucose, Bld: 71 mg/dL (ref 70–99)
Potassium: 4.1 mEq/L (ref 3.5–5.1)
Sodium: 137 mEq/L (ref 135–145)
Total Bilirubin: 0.7 mg/dL (ref 0.2–1.2)
Total Protein: 6.3 g/dL (ref 6.0–8.3)

## 2021-04-16 LAB — TSH: TSH: 3.21 u[IU]/mL (ref 0.35–5.50)

## 2021-04-18 DIAGNOSIS — Z0000 Encounter for general adult medical examination without abnormal findings: Secondary | ICD-10-CM | POA: Insufficient documentation

## 2021-04-18 NOTE — Assessment & Plan Note (Signed)
ghm utd Check labs  

## 2021-04-18 NOTE — Assessment & Plan Note (Signed)
Check labs con't synthroid 

## 2021-05-18 ENCOUNTER — Other Ambulatory Visit: Payer: Self-pay | Admitting: Family Medicine

## 2021-07-21 ENCOUNTER — Other Ambulatory Visit: Payer: Self-pay | Admitting: Family Medicine

## 2021-07-21 DIAGNOSIS — Z1231 Encounter for screening mammogram for malignant neoplasm of breast: Secondary | ICD-10-CM

## 2021-09-01 ENCOUNTER — Encounter: Payer: Self-pay | Admitting: Internal Medicine

## 2021-09-02 ENCOUNTER — Ambulatory Visit
Admission: RE | Admit: 2021-09-02 | Discharge: 2021-09-02 | Disposition: A | Discharge status: Home / Self Care | Payer: BC Managed Care – PPO | Source: Ambulatory Visit | Attending: Family Medicine | Admitting: Family Medicine

## 2021-09-02 DIAGNOSIS — Z1231 Encounter for screening mammogram for malignant neoplasm of breast: Secondary | ICD-10-CM | POA: Diagnosis not present

## 2021-09-21 ENCOUNTER — Ambulatory Visit (AMBULATORY_SURGERY_CENTER): Payer: Self-pay | Admitting: *Deleted

## 2021-09-21 VITALS — Ht 67.0 in | Wt 109.0 lb

## 2021-09-21 DIAGNOSIS — Z1211 Encounter for screening for malignant neoplasm of colon: Secondary | ICD-10-CM

## 2021-09-21 NOTE — Progress Notes (Signed)
Patient is here in-person for PV. Patient denies any allergies to eggs or soy. Patient denies any problems with anesthesia/sedation. Patient is not on any oxygen at home. Patient is not taking any diet/weight loss medications or blood thinners. Went over procedure prep instructions with the patient. Patient is aware of our care-partner policy.

## 2021-10-05 ENCOUNTER — Encounter: Payer: Self-pay | Admitting: Internal Medicine

## 2021-10-12 ENCOUNTER — Encounter: Payer: Self-pay | Admitting: Internal Medicine

## 2021-10-12 ENCOUNTER — Ambulatory Visit (AMBULATORY_SURGERY_CENTER): Payer: BC Managed Care – PPO | Admitting: Internal Medicine

## 2021-10-12 VITALS — BP 140/54 | HR 63 | Temp 98.4°F | Resp 10 | Ht 67.0 in | Wt 109.0 lb

## 2021-10-12 DIAGNOSIS — Z1211 Encounter for screening for malignant neoplasm of colon: Secondary | ICD-10-CM | POA: Diagnosis not present

## 2021-10-12 DIAGNOSIS — K635 Polyp of colon: Secondary | ICD-10-CM | POA: Diagnosis not present

## 2021-10-12 DIAGNOSIS — D124 Benign neoplasm of descending colon: Secondary | ICD-10-CM

## 2021-10-12 MED ORDER — SODIUM CHLORIDE 0.9 % IV SOLN: 500.0000 mL | Freq: Once | INTRAVENOUS | Status: DC

## 2021-10-12 NOTE — Progress Notes (Signed)
VS completed by CW.   Pt's states no medical or surgical changes since previsit or office visit.  

## 2021-10-12 NOTE — Progress Notes (Signed)
Report to PACU, RN, vss, BBS= Clear.  

## 2021-10-12 NOTE — Patient Instructions (Addendum)
I found and removed one very tiny polyp and saw some diverticulosis (mild).  I will let you know pathology results and when to have another routine colonoscopy by mail and/or My Chart.  I appreciate the opportunity to care for you. Gatha Kreutzer, MD, [facility]   Await pathology results.  Resume previous diet and medications.  YOU HAD AN ENDOSCOPIC PROCEDURE TODAY AT [facility]:   Refer to the procedure report that was given to you for any specific questions about what was found during the examination.  If the procedure report does not answer your questions, please call your gastroenterologist to clarify.  If you requested that your care partner not be given the details of your procedure findings, then the procedure report has been included in a sealed envelope for you to review at your convenience later.  YOU SHOULD EXPECT: Some feelings of bloating in the abdomen. Passage of more gas than usual.  Walking can help get rid of the air that was put into your GI tract during the procedure and reduce the bloating. If you had a lower endoscopy (such as a colonoscopy or flexible sigmoidoscopy) you may notice spotting of blood in your stool or on the toilet paper. If you underwent a bowel prep for your procedure, you may not have a normal bowel movement for a few days.  Please Note:  You might notice some irritation and congestion in your nose or some drainage.  This is from the oxygen used during your procedure.  There is no need for concern and it should clear up in a day or so.  SYMPTOMS TO REPORT IMMEDIATELY:  Following lower endoscopy (colonoscopy or flexible sigmoidoscopy):  Excessive amounts of blood in the stool  Significant tenderness or worsening of abdominal pains  Swelling of the abdomen that is new, acute  Fever of 100F or higher   For urgent or emergent issues, a gastroenterologist can be reached at any hour by calling [phone]. Do not use MyChart messaging for  urgent concerns.    DIET:  We do recommend a small meal at first, but then you may proceed to your regular diet.  Drink plenty of fluids but you should avoid alcoholic beverages for 24 hours.  ACTIVITY:  You should plan to take it easy for the rest of today and you should NOT DRIVE or use heavy machinery until tomorrow (because of the sedation medicines used during the test).    FOLLOW UP: Our staff will call the number listed on your records the next business day following your procedure.  We will call around 7:15- 8:00 am to check on you and address any questions or concerns that you may have regarding the information given to you following your procedure. If we do not reach you, we will leave a message.  If you develop any symptoms (ie: fever, flu-like symptoms, shortness of breath, cough etc.) before then, please call [phone].  If you test positive for Covid 19 in the 2 weeks post procedure, please call and report this information to [place].    If any biopsies were taken you will be contacted by phone or by letter within the next 1-3 weeks.  Please call us at [phone] if you have not heard about the biopsies in 3 weeks.    SIGNATURES/CONFIDENTIALITY: You and/or your care partner have signed paperwork which will be entered into your electronic medical record.  These signatures attest to the fact that that the information above on  your After Visit Summary has been reviewed and is understood.  Full responsibility of the confidentiality of this discharge information lies with you and/or your care-partner.

## 2021-10-12 NOTE — Progress Notes (Signed)
[facility] History and Physical   Primary Care Physician:  Carollee Herter, Alferd Apa, DO   Reason for Procedure:   CRCA screen  Plan:    colonoscopy     HPI: Kathryn Mcdaniel is a 66 y.o. 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), unspecified site    infiltrating ductal,right breast   Personal history of cervical dysplasia    Personal history of chemotherapy    Personal history of radiation therapy    Thyroid disease    Unspecified personal history presenting hazards to health    Radiation Therapy    Past Surgical History:  Procedure Laterality Date   BREAST BIOPSY Right 2007   BREAST SURGERY     right mastectomy   COLONOSCOPY  11/10/2009   MASS EXCISION Right 08/09/2013   Procedure: MINOR EXCISION OF MASS CHEST WALL;  Surgeon: [name], MD;  Location: [facility];  Service: General;  Laterality: Right;   MASTECTOMY Right 2007   NASAL SINUS SURGERY     sinus stripping    Prior to Admission medications   Medication Sig Start Date End Date Taking? Authorizing Provider  ALPRAZolam Duanne Moron) 0.5 MG tablet Take 1 tablet (0.5 mg total) by mouth at bedtime as needed. 12/04/13  Yes Midge Minium, MD  Ascorbic Acid (VITAMIN C) 1000 MG tablet Take 1,000 mg by mouth daily.   Yes [provider]  Calcium Carbonate-Vitamin D (CALTRATE 600+D) 600-400 MG-UNIT per tablet Take 1 tablet by mouth 2 (two) times daily. 05/04/12  Yes Roma Schanz R, DO  FLUoxetine (PROZAC) 40 MG capsule Take 40 mg by mouth 2 (two) times daily.   Yes [provider]  levothyroxine (SYNTHROID) 100 MCG tablet TAKE 1 TABLET BY MOUTH EVERY DAY BEFORE BREAKFAST  05/18/21  Yes [name], DO  Multiple Vitamin (MULTIVITAMIN) tablet Take 1 tablet by mouth daily.   Yes [provider]  vitamin E 400 UNIT capsule Take 400 Units by mouth daily.   Yes [provider]    Current Outpatient Medications  Medication Sig Dispense Refill   ALPRAZolam (XANAX) 0.5 MG tablet Take 1 tablet (0.5 mg total) by mouth at bedtime as needed. 30 tablet 0   Ascorbic Acid (VITAMIN C) 1000 MG tablet Take 1,000 mg by mouth daily.     Calcium Carbonate-Vitamin D (CALTRATE 600+D) 600-400 MG-UNIT per tablet Take 1 tablet by mouth 2 (two) times daily.     FLUoxetine (PROZAC) 40 MG capsule Take 40 mg by mouth 2 (two) times daily.     levothyroxine (SYNTHROID) 100 MCG tablet TAKE 1 TABLET BY MOUTH EVERY DAY BEFORE BREAKFAST 90 tablet 1   Multiple Vitamin (MULTIVITAMIN) tablet Take 1 tablet by mouth daily.     vitamin E 400 UNIT capsule Take 400 Units by mouth daily.     Current Facility-Administered Medications  Medication Dose Route Frequency Provider Last Rate Last Admin   0.9 %  sodium chloride infusion  500 mL Intravenous Once Gatha Mclouth, MD        Allergies as of 10/12/2021 - Review Complete 10/12/2021  Allergen Reaction Noted   Compazine [prochlorperazine  edisylate]  03/08/2014   Metoclopramide hcl Other (See Comments)     Family History  Problem Relation Age of Onset   Breast cancer Mother    Squamous cell carcinoma Mother        sinus   Transient ischemic attack Mother    Cancer Father        prostate   Hypertension Father    Diabetes Father    Leukemia Sister    Breast cancer Sister    Cancer Brother    Stroke Maternal Grandmother    Breast cancer Paternal Grandmother    Cancer Other        grandmother, breast cancer   Colon cancer Neg Hx    Coronary artery disease Neg Hx    Colon polyps Neg Hx    Esophageal cancer Neg Hx    Rectal cancer Neg Hx    Stomach cancer Neg Hx     Social History   Socioeconomic History    Marital status: Single    Spouse name: Not on file   Number of children: Not on file   Years of education: Not on file   Highest education level: Not on file  Occupational History   Occupation: self employed  Tobacco Use   Smoking status: Former    Types: Cigarettes    Quit date: 09/15/1984    Years since quitting: 37.0   Smokeless tobacco: Never  Vaping Use   Vaping Use: Never used  Substance and Sexual Activity   Alcohol use: Yes    Alcohol/week: 2.0 standard drinks of alcohol    Types: 2 Cans of beer per week   Drug use: No   Sexual activity: Not on file  Other Topics Concern   Not on file  Social History Narrative   Single   Work; Therapist, occupational, has a dog walking business   Exercise-- swim, walking dogs and bike   Social Determinants of Radio broadcast assistant Strain: Not on file  Food Insecurity: Not on file  Transportation Needs: Not on file  Physical Activity: Not on file  Stress: Not on file  Social Connections: Not on file  Intimate Partner Violence: Not on file    Review of Systems:  All other review of systems negative except as mentioned in the HPI.  Physical Exam: Vital signs BP (!) 160/75   Pulse 73   Temp 98.4 F (36.9 C) (Temporal)   Resp 14   Ht '5\' 7"'$  (1.702 m)   Wt 109 lb (49.4 kg)   SpO2 95%   BMI 17.07 kg/m   General:   Alert,  Well-developed, well-nourished, pleasant and cooperative in NAD Lungs:  Clear throughout to auscultation.   Heart:  Regular rate and rhythm; no murmurs, clicks, rubs,  or gallops. Abdomen:  Soft, nontender and nondistended. Normal bowel sounds.   Neuro/Psych:  Alert and cooperative. Normal mood and affect. A and O x 3   '@Arkel Cartwright'$  Simonne Maffucci, MD, [facility] [phone] (pager) 10/12/2021 11:54 AM@

## 2021-10-12 NOTE — Progress Notes (Signed)
Called to room to assist during endoscopic procedure.  Patient ID and intended procedure confirmed with present staff. Received instructions for my participation in the procedure from the performing physician.  

## 2021-10-12 NOTE — Op Note (Signed)
[facility] Patient Name: Kathryn Mcdaniel Procedure Date: 10/12/2021 11:54 AM MRN: [id number] Endoscopist: Gatha Bon , MD Age: 66 Referring MD:  Date of Birth: 07-26-1955 Gender: Female Account #: [id number] Procedure:                Colonoscopy Indications:              Screening for colorectal malignant neoplasm, Last                            colonoscopy: 2013 Medicines:                Monitored Anesthesia Care Procedure:                Pre-Anesthesia Assessment:                           - Prior to the procedure, a History and Physical                            was performed, and patient medications and                            allergies were reviewed. The patient's tolerance of                            previous anesthesia was also reviewed. The risks                            and benefits of the procedure and the sedation                            options and risks were discussed with the patient.                            All questions were answered, and informed consent                            was obtained. Prior Anticoagulants: The patient has                            taken no previous anticoagulant or antiplatelet                            agents. ASA Grade Assessment: II - A patient with                            mild systemic disease. After reviewing the risks                            and benefits, the patient was deemed in                            satisfactory condition to undergo the procedure.  After obtaining informed consent, the colonoscope                            was passed under direct vision. Throughout the                            procedure, the patient's blood pressure, pulse, and                            oxygen saturations were monitored continuously. The                            Olympus PCF-H190DL (#[id number]) Colonoscope was                            introduced through the anus and advanced to  the the                            cecum, identified by appendiceal orifice and                            ileocecal valve. The colonoscopy was somewhat                            difficult due to significant looping. Successful                            completion of the procedure was aided by applying                            abdominal pressure. The patient tolerated the                            procedure well. The quality of the bowel                            preparation was good. The ileocecal valve,                            appendiceal orifice, and rectum were photographed. Scope In: 12:01:05 PM Scope Out: 12:28:05 PM Scope Withdrawal Time: 0 hours 16 minutes 7 seconds  Total Procedure Duration: 0 hours 27 minutes 0 seconds  Findings:                 The perianal and digital rectal examinations were                            normal.                           A 4 mm polyp was found in the descending colon. The                            polyp was flat. The polyp was removed with a cold  snare. Resection and retrieval were complete.                            Verification of patient identification for the                            specimen was done. Estimated blood loss was minimal.                           A few small-mouthed diverticula were found in the                            sigmoid colon.                           The exam was otherwise without abnormality on                            direct and retroflexion views. Complications:            No immediate complications. Estimated Blood Loss:     Estimated blood loss was minimal. Impression:               - One 4 mm polyp in the descending colon, removed                            with a cold snare. Resected and retrieved.                           - Diverticulosis in the sigmoid colon.                           - The examination was otherwise normal on direct                             and retroflexion views. Recommendation:           - Patient has a contact number available for                            emergencies. The signs and symptoms of potential                            delayed complications were discussed with the                            patient. Return to normal activities tomorrow.                            Written discharge instructions were provided to the                            patient.                           - Resume previous diet.                           -  Continue present medications.                           - Repeat colonoscopy is recommended. The                            colonoscopy date will be determined after pathology                            results from today's exam become available for                            review. Gatha Bureau, MD 10/12/2021 12:35:10 PM This report has been signed electronically.

## 2021-10-13 ENCOUNTER — Telehealth: Payer: Self-pay

## 2021-10-13 NOTE — Telephone Encounter (Signed)
No answer, left message to call if having any issues or concerns, B.Haidan Nhan RN 

## 2021-10-15 DIAGNOSIS — N9089 Other specified noninflammatory disorders of vulva and perineum: Secondary | ICD-10-CM | POA: Diagnosis not present

## 2021-10-18 ENCOUNTER — Encounter: Payer: Self-pay | Admitting: Internal Medicine

## 2021-10-18 DIAGNOSIS — Z860100 Personal history of colon polyps, unspecified: Secondary | ICD-10-CM | POA: Insufficient documentation

## 2021-10-18 HISTORY — DX: Personal history of colon polyps, unspecified: Z86.0100

## 2021-11-02 ENCOUNTER — Ambulatory Visit
Admission: RE | Admit: 2021-11-02 | Discharge: 2021-11-02 | Disposition: A | Discharge status: Home / Self Care | Payer: BC Managed Care – PPO | Source: Ambulatory Visit | Attending: Family Medicine | Admitting: Family Medicine

## 2021-11-02 DIAGNOSIS — E2839 Other primary ovarian failure: Secondary | ICD-10-CM

## 2021-11-02 DIAGNOSIS — Z780 Asymptomatic menopausal state: Secondary | ICD-10-CM | POA: Diagnosis not present

## 2021-11-02 DIAGNOSIS — M8589 Other specified disorders of bone density and structure, multiple sites: Secondary | ICD-10-CM | POA: Diagnosis not present

## 2021-11-11 ENCOUNTER — Other Ambulatory Visit: Payer: Self-pay | Admitting: Family Medicine

## 2021-12-13 DIAGNOSIS — Z681 Body mass index (BMI) 19 or less, adult: Secondary | ICD-10-CM | POA: Diagnosis not present

## 2021-12-13 DIAGNOSIS — R87810 Cervical high risk human papillomavirus (HPV) DNA test positive: Secondary | ICD-10-CM | POA: Diagnosis not present

## 2021-12-13 DIAGNOSIS — Z01419 Encounter for gynecological examination (general) (routine) without abnormal findings: Secondary | ICD-10-CM | POA: Diagnosis not present

## 2021-12-14 ENCOUNTER — Other Ambulatory Visit: Payer: Self-pay | Admitting: Obstetrics and Gynecology

## 2021-12-14 DIAGNOSIS — R92333 Mammographic heterogeneous density, bilateral breasts: Secondary | ICD-10-CM

## 2021-12-24 ENCOUNTER — Ambulatory Visit: Payer: BC Managed Care – PPO

## 2022-01-02 ENCOUNTER — Ambulatory Visit
Admission: RE | Admit: 2022-01-02 | Discharge: 2022-01-02 | Disposition: A | Discharge status: Home / Self Care | Payer: BC Managed Care – PPO | Source: Ambulatory Visit | Attending: Obstetrics and Gynecology | Admitting: Obstetrics and Gynecology

## 2022-01-02 DIAGNOSIS — R92333 Mammographic heterogeneous density, bilateral breasts: Secondary | ICD-10-CM

## 2022-01-02 DIAGNOSIS — N6489 Other specified disorders of breast: Secondary | ICD-10-CM | POA: Diagnosis not present

## 2022-01-02 MED ORDER — GADOPICLENOL 0.5 MMOL/ML IV SOLN
5.0000 mL | Freq: Once | INTRAVENOUS | Status: AC | PRN
  Administered 2022-01-02: 5 mL via INTRAVENOUS

## 2022-02-18 DIAGNOSIS — Z86018 Personal history of other benign neoplasm: Secondary | ICD-10-CM | POA: Diagnosis not present

## 2022-02-18 DIAGNOSIS — L821 Other seborrheic keratosis: Secondary | ICD-10-CM | POA: Diagnosis not present

## 2022-02-18 DIAGNOSIS — Z85828 Personal history of other malignant neoplasm of skin: Secondary | ICD-10-CM | POA: Diagnosis not present

## 2022-02-18 DIAGNOSIS — D2261 Melanocytic nevi of right upper limb, including shoulder: Secondary | ICD-10-CM | POA: Diagnosis not present

## 2022-04-25 ENCOUNTER — Other Ambulatory Visit: Payer: Self-pay | Admitting: Family Medicine

## 2022-05-05 ENCOUNTER — Encounter: Payer: Self-pay | Admitting: Family Medicine

## 2022-05-05 ENCOUNTER — Ambulatory Visit (INDEPENDENT_AMBULATORY_CARE_PROVIDER_SITE_OTHER): Payer: BC Managed Care – PPO | Admitting: Family Medicine

## 2022-05-05 VITALS — BP 114/80 | HR 60 | Temp 97.9°F | Resp 18 | Ht 67.0 in | Wt 110.4 lb

## 2022-05-05 DIAGNOSIS — Z0000 Encounter for general adult medical examination without abnormal findings: Secondary | ICD-10-CM

## 2022-05-05 DIAGNOSIS — E039 Hypothyroidism, unspecified: Secondary | ICD-10-CM | POA: Diagnosis not present

## 2022-05-05 NOTE — Assessment & Plan Note (Signed)
Ghm utd Check labs See AVS Health Maintenance  Topic Date Due   Zoster Vaccines- Shingrix (1 of 2) Never done   COVID-19 Vaccine (5 - 2023-24 season) 05/21/2022 (Originally 10/22/2021)   INFLUENZA VACCINE  05/22/2022 (Originally 09/21/2021)   HIV Screening  05/05/2023 (Originally 12/22/1972)   MAMMOGRAM  09/03/2023   PAP SMEAR-Modifier  09/22/2023   COLONOSCOPY (Pts 45-101yr Insurance coverage will need to be confirmed)  10/12/2028   DTaP/Tdap/Td (4 - Td or Tdap) 03/03/2029   Hepatitis C Screening  Completed   HPV VACCINES  Aged Out

## 2022-05-05 NOTE — Progress Notes (Signed)
Subjective:   By signing my name below, I, Kathryn Mcdaniel, attest that this documentation has been prepared under the direction and in the presence of Kathryn Held, DO 05/05/22   Patient ID: Kathryn Mcdaniel, female    DOB: 05-Sep-1955, 67 y.o.   MRN: [id number]  Chief Complaint  Patient presents with   Annual Exam    Pt states fasting     HPI Patient is in today for comprehensive physical exam. She is overall well.   She endorses occasional joint stiffness, which she attributes to her age.   She is compliant with Prozac 40 mg twice daily and Xanax as needed.   Last bone density: 11/02/2021. Osteopenic.  Last colonoscopy: 10/12/2021. Polyps found and removed. Diverticulosis found. Otherwise normal.   Last mammogram: 11/122/2023. No evidence of breast malignancy.   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   She is UTD on routine dental exams. She is UTD on routine vision exams. She will be having cataracts surgery soon.   She has not received the shingles vaccine yet.   Past Medical History:  Diagnosis Date   Abnormal breast biopsy    Basal cell carcinoma of skin    left temple   Breast cancer ([place]) 2007   RIGHT 2007-2008   Depression    Human papillomavirus in conditions classified elsewhere and of unspecified site    Hx of sessile serrated colonic polyp 10/18/2021   Malignant neoplasm of breast (female), unspecified site    infiltrating ductal,right breast   Personal history of cervical dysplasia    Personal history of chemotherapy    Personal history of radiation therapy    Thyroid disease    Unspecified personal history presenting hazards to health    Radiation Therapy    Past Surgical History:  Procedure Laterality Date   BREAST BIOPSY Right 2007   BREAST SURGERY     right mastectomy   COLONOSCOPY  11/10/2009   MASS EXCISION Right 08/09/2013   Procedure: MINOR EXCISION OF MASS CHEST WALL;  Surgeon: [name], MD;  Location:  [facility];  Service: General;  Laterality: Right;   MASTECTOMY Right 2007   NASAL SINUS SURGERY     sinus stripping    Family History  Problem Relation Age of Onset   Breast cancer Mother    Squamous cell carcinoma Mother        sinus   Transient ischemic attack Mother    Cancer Father        prostate   Hypertension Father    Diabetes Father    Breast cancer Sister    Stroke Maternal Grandmother    Breast cancer Paternal Grandmother    Cancer Other        grandmother, breast cancer   Colon cancer Neg Hx    Coronary artery disease Neg Hx    Colon polyps Neg Hx    Esophageal cancer Neg Hx    Rectal cancer Neg Hx    Stomach cancer Neg Hx     Social History   Socioeconomic History   Marital status: Single    Spouse name: Not on file   Number of children: Not on file   Years of education: Not on file   Highest education level: Not on file  Occupational History   Occupation: self employed    Comment: Designer, industrial/product  Tobacco Use   Smoking status: Former    Types: Cigarettes  Quit date: 09/15/1984    Years since quitting: 37.6   Smokeless tobacco: Never  Vaping Use   Vaping Use: Never used  Substance and Sexual Activity   Alcohol use: Yes    Alcohol/week: 2.0 standard drinks of alcohol    Types: 2 Cans of beer per week   Drug use: No   Sexual activity: Never  Other Topics Concern   Not on file  Social History Narrative   SingleWork; Therapist, occupational, has a dog walking businessExercise-- swim, walking dogs and bike   Retired from [employer] Strain: Not on file  Food Insecurity: Not on file  Transportation Needs: Not on file  Physical Activity: Not on file  Stress: Not on file  Social Connections: Not on file  Intimate Partner Violence: Not on file    Outpatient Medications Prior to Visit  Medication Sig Dispense Refill   ALPRAZolam (XANAX) 0.5 MG tablet Take 1 tablet (0.5 mg total) by  mouth at bedtime as needed. 30 tablet 0   Ascorbic Acid (VITAMIN C) 1000 MG tablet Take 1,000 mg by mouth daily.     Calcium Carbonate-Vitamin D (CALTRATE 600+D) 600-400 MG-UNIT per tablet Take 1 tablet by mouth 2 (two) times daily.     FLUoxetine (PROZAC) 40 MG capsule Take 40 mg by mouth 2 (two) times daily.     levothyroxine (SYNTHROID) 100 MCG tablet TAKE 1 TABLET BY MOUTH EVERY DAY BEFORE BREAKFAST 90 tablet 1   Multiple Vitamin (MULTIVITAMIN) tablet Take 1 tablet by mouth daily.     vitamin E 400 UNIT capsule Take 400 Units by mouth daily.     No facility-administered medications prior to visit.    Allergies  Allergen Reactions   Compazine [Prochlorperazine Edisylate]     "bizarre, dystonic"   Metoclopramide Hcl Other (See Comments)    Dystonia     Review of Systems  Constitutional:  Negative for fever and malaise/fatigue.  HENT:  Negative for congestion.   Eyes:  Negative for blurred vision.  Respiratory:  Negative for cough and shortness of breath.   Cardiovascular:  Negative for chest pain, palpitations and leg swelling.  Gastrointestinal:  Negative for vomiting.  Musculoskeletal:  Positive for joint pain (occasional joint stiffness - attribtued to age). Negative for back pain.  Skin:  Negative for rash.  Neurological:  Negative for loss of consciousness and headaches.       Objective:    Physical Exam Constitutional:      General: She is not in acute distress.    Appearance: Normal appearance. She is well-developed.  HENT:     Head: Normocephalic and atraumatic.     Right Ear: External ear normal.     Left Ear: External ear normal.     Nose: Nose normal.  Eyes:     Extraocular Movements: Extraocular movements intact.     Pupils: Pupils are equal, round, and reactive to light.  Cardiovascular:     Rate and Rhythm: Normal rate and regular rhythm.     Pulses: Normal pulses.     Heart sounds: Normal heart sounds. No murmur heard.    No gallop.  Pulmonary:      Effort: Pulmonary effort is normal. No respiratory distress.     Breath sounds: Normal breath sounds. No wheezing or rales.  Chest:     Chest wall: No tenderness.  Musculoskeletal:        General: Normal range of motion.  Cervical back: Normal range of motion and neck supple.  Skin:    General: Skin is warm and dry.  Neurological:     General: No focal deficit present.     Mental Status: She is alert and oriented to person, place, and time.  Psychiatric:        Mood and Affect: Mood normal.        Behavior: Behavior normal.        Thought Content: Thought content normal.        Judgment: Judgment normal.     BP 114/80 (BP Location: Left Arm, Patient Position: Sitting, Cuff Size: Normal)   Pulse 60   Temp 97.9 F (36.6 C) (Oral)   Resp 18   Ht '5\' 7"'$  (1.702 m)   Wt 110 lb 6.4 oz (50.1 kg)   SpO2 99%   BMI 17.29 kg/m  Wt Readings from Last 3 Encounters:  05/05/22 110 lb 6.4 oz (50.1 kg)  10/12/21 109 lb (49.4 kg)  09/21/21 109 lb (49.4 kg)       Assessment & Plan:  Preventative health care Assessment & Plan: Ghm utd Check labs See AVS Health Maintenance  Topic Date Due   Zoster Vaccines- Shingrix (1 of 2) Never done   COVID-19 Vaccine (5 - 2023-24 season) 05/21/2022 (Originally 10/22/2021)   INFLUENZA VACCINE  05/22/2022 (Originally 09/21/2021)   HIV Screening  05/05/2023 (Originally 12/22/1972)   MAMMOGRAM  09/03/2023   PAP SMEAR-Modifier  09/22/2023   COLONOSCOPY (Pts 45-28yr Insurance coverage will need to be confirmed)  10/12/2028   DTaP/Tdap/Td (4 - Td or Tdap) 03/03/2029   Hepatitis C Screening  Completed   HPV VACCINES  Aged Out     Orders: -     CBC with Differential/Platelet -     Comprehensive metabolic panel -     Lipid panel -     TSH  Hypothyroidism, unspecified type Assessment & Plan: Check labs Con't synthroid  Orders: -     TSH     I,Rachel Rivera,acting as a scribe for YAnn Held DO.,have documented all relevant  documentation on the behalf of YAnn Held DO,as directed by  YAnn Held DO while in the presence of YAnn Held DO.   I, YAnn Held DO, personally preformed the services described in this documentation.  All medical record entries made by the scribe were at my direction and in my presence.  I have reviewed the chart and discharge instructions (if applicable) and agree that the record reflects my personal performance and is accurate and complete. 05/05/22   YAnn Held DO

## 2022-05-05 NOTE — Patient Instructions (Signed)
Preventive Care 40-64 Years Old, Female Preventive care refers to lifestyle choices and visits with your health care provider that can promote health and wellness. Preventive care visits are also called wellness exams. What can I expect for my preventive care visit? Counseling Your health care provider may ask you questions about your: Medical history, including: Past medical problems. Family medical history. Pregnancy history. Current health, including: Menstrual cycle. Method of birth control. Emotional well-being. Home life and relationship well-being. Sexual activity and sexual health. Lifestyle, including: Alcohol, nicotine or tobacco, and drug use. Access to firearms. Diet, exercise, and sleep habits. Work and work environment. Sunscreen use. Safety issues such as seatbelt and bike helmet use. Physical exam Your health care provider will check your: Height and weight. These may be used to calculate your BMI (body mass index). BMI is a measurement that tells if you are at a healthy weight. Waist circumference. This measures the distance around your waistline. This measurement also tells if you are at a healthy weight and may help predict your risk of certain diseases, such as type 2 diabetes and high blood pressure. Heart rate and blood pressure. Body temperature. Skin for abnormal spots. What immunizations do I need?  Vaccines are usually given at various ages, according to a schedule. Your health care provider will recommend vaccines for you based on your age, medical history, and lifestyle or other factors, such as travel or where you work. What tests do I need? Screening Your health care provider may recommend screening tests for certain conditions. This may include: Lipid and cholesterol levels. Diabetes screening. This is done by checking your blood sugar (glucose) after you have not eaten for a while (fasting). Pelvic exam and Pap test. Hepatitis B test. Hepatitis C  test. HIV (human immunodeficiency virus) test. STI (sexually transmitted infection) testing, if you are at risk. Lung cancer screening. Colorectal cancer screening. Mammogram. Talk with your health care provider about when you should start having regular mammograms. This may depend on whether you have a family history of breast cancer. BRCA-related cancer screening. This may be done if you have a family history of breast, ovarian, tubal, or peritoneal cancers. Bone density scan. This is done to screen for osteoporosis. Talk with your health care provider about your test results, treatment options, and if necessary, the need for more tests. Follow these instructions at home: Eating and drinking  Eat a diet that includes fresh fruits and vegetables, whole grains, lean protein, and low-fat dairy products. Take vitamin and mineral supplements as recommended by your health care provider. Do not drink alcohol if: Your health care provider tells you not to drink. You are pregnant, may be pregnant, or are planning to become pregnant. If you drink alcohol: Limit how much you have to 0-1 drink a day. Know how much alcohol is in your drink. In the [place] one drink equals one 12 oz bottle of beer (355 mL), one 5 oz glass of wine (148 mL), or one 1 oz glass of hard liquor (44 mL). Lifestyle Brush your teeth every morning and night with fluoride toothpaste. Floss one time each day. Exercise for at least 30 minutes 5 or more days each week. Do not use any products that contain nicotine or tobacco. These products include cigarettes, chewing tobacco, and vaping devices, such as e-cigarettes. If you need help quitting, ask your health care provider. Do not use drugs. If you are sexually active, practice safe sex. Use a condom or other form of protection to   prevent STIs. If you do not wish to become pregnant, use a form of birth control. If you plan to become pregnant, see your health care provider for a  prepregnancy visit. Take aspirin only as told by your health care provider. Make sure that you understand how much to take and what form to take. Work with your health care provider to find out whether it is safe and beneficial for you to take aspirin daily. Find healthy ways to manage stress, such as: Meditation, yoga, or listening to music. Journaling. Talking to a trusted person. Spending time with friends and family. Minimize exposure to UV radiation to reduce your risk of skin cancer. Safety Always wear your seat belt while driving or riding in a vehicle. Do not drive: If you have been drinking alcohol. Do not ride with someone who has been drinking. When you are tired or distracted. While texting. If you have been using any mind-altering substances or drugs. Wear a helmet and other protective equipment during sports activities. If you have firearms in your house, make sure you follow all gun safety procedures. Seek help if you have been physically or sexually abused. What's next? Visit your health care provider once a year for an annual wellness visit. Ask your health care provider how often you should have your eyes and teeth checked. Stay up to date on all vaccines. This information is not intended to replace advice given to you by your health care provider. Make sure you discuss any questions you have with your health care provider. Document Revised: 08/05/2020 Document Reviewed: 08/05/2020 Elsevier Patient Education  2023 [employer] Inc.  

## 2022-05-05 NOTE — Assessment & Plan Note (Signed)
Check labs Cont synthroid 

## 2022-05-06 LAB — CBC WITH DIFFERENTIAL/PLATELET
Basophils Absolute: 0 10*3/uL (ref 0.0–0.1)
Basophils Relative: 0.9 % (ref 0.0–3.0)
Eosinophils Absolute: 0.1 10*3/uL (ref 0.0–0.7)
Eosinophils Relative: 2.6 % (ref 0.0–5.0)
HCT: 37.4 % (ref 36.0–46.0)
Hemoglobin: 12.6 g/dL (ref 12.0–15.0)
Lymphocytes Relative: 32.5 % (ref 12.0–46.0)
Lymphs Abs: 1.7 10*3/uL (ref 0.7–4.0)
MCHC: 33.7 g/dL (ref 30.0–36.0)
MCV: 94.3 fl (ref 78.0–100.0)
Monocytes Absolute: 0.4 10*3/uL (ref 0.1–1.0)
Monocytes Relative: 7.7 % (ref 3.0–12.0)
Neutro Abs: 2.9 10*3/uL (ref 1.4–7.7)
Neutrophils Relative %: 56.3 % (ref 43.0–77.0)
Platelets: 190 10*3/uL (ref 150.0–400.0)
RBC: 3.96 Mil/uL (ref 3.87–5.11)
RDW: 12.7 % (ref 11.5–15.5)
WBC: 5.2 10*3/uL (ref 4.0–10.5)

## 2022-05-06 LAB — COMPREHENSIVE METABOLIC PANEL
ALT: 21 U/L (ref 0–35)
AST: 20 U/L (ref 0–37)
Albumin: 4.1 g/dL (ref 3.5–5.2)
Alkaline Phosphatase: 53 U/L (ref 39–117)
BUN: 19 mg/dL (ref 6–23)
CO2: 29 mEq/L (ref 19–32)
Calcium: 9.8 mg/dL (ref 8.4–10.5)
Chloride: 102 mEq/L (ref 96–112)
Creatinine, Ser: 0.55 mg/dL (ref 0.40–1.20)
GFR: 96.9 mL/min (ref >60.00)
Glucose, Bld: 72 mg/dL (ref 70–99)
Potassium: 4 mEq/L (ref 3.5–5.1)
Sodium: 139 mEq/L (ref 135–145)
Total Bilirubin: 0.5 mg/dL (ref 0.2–1.2)
Total Protein: 6.5 g/dL (ref 6.0–8.3)

## 2022-05-06 LAB — LIPID PANEL
Cholesterol: 173 mg/dL (ref 0–200)
HDL: 74.8 mg/dL (ref >39.00)
LDL Cholesterol: 83 mg/dL (ref 0–99)
NonHDL: 98.48
Total CHOL/HDL Ratio: 2
Triglycerides: 76 mg/dL (ref 0.0–149.0)
VLDL: 15.2 mg/dL (ref 0.0–40.0)

## 2022-05-06 LAB — TSH: TSH: 2.89 u[IU]/mL (ref 0.35–5.50)

## 2022-08-02 ENCOUNTER — Other Ambulatory Visit: Payer: Self-pay | Admitting: Family Medicine

## 2022-08-02 DIAGNOSIS — Z1231 Encounter for screening mammogram for malignant neoplasm of breast: Secondary | ICD-10-CM

## 2022-09-05 ENCOUNTER — Other Ambulatory Visit: Payer: Self-pay | Admitting: Family Medicine

## 2022-09-05 ENCOUNTER — Ambulatory Visit
Admission: RE | Admit: 2022-09-05 | Discharge: 2022-09-05 | Disposition: A | Discharge status: Home / Self Care | Payer: BC Managed Care – PPO | Source: Ambulatory Visit | Attending: Family Medicine | Admitting: Family Medicine

## 2022-09-05 DIAGNOSIS — Z1231 Encounter for screening mammogram for malignant neoplasm of breast: Secondary | ICD-10-CM

## 2022-10-14 ENCOUNTER — Other Ambulatory Visit: Payer: Self-pay | Admitting: Family Medicine

## 2022-12-01 ENCOUNTER — Telehealth: Payer: Self-pay | Admitting: Family Medicine

## 2022-12-01 MED ORDER — LEVOTHYROXINE SODIUM 100 MCG PO TABS: 100.0000 ug | ORAL_TABLET | Freq: Every day | ORAL | 1 refills | Status: DC

## 2022-12-01 NOTE — Addendum Note (Signed)
Addended by: Roxanne Gates on: 12/01/2022 01:32 PM   Modules accepted: Orders

## 2022-12-01 NOTE — Telephone Encounter (Signed)
Pharmacy updated. Rx sent in

## 2022-12-01 NOTE — Telephone Encounter (Signed)
Pt called to request that her pharmacy be changed to [facility] [address] [place].   Also please send a refill for her levothyroxine 100 mcg to this pharmacy.

## 2023-01-04 ENCOUNTER — Other Ambulatory Visit: Payer: Self-pay | Admitting: Obstetrics and Gynecology

## 2023-01-04 DIAGNOSIS — C50111 Malignant neoplasm of central portion of right female breast: Secondary | ICD-10-CM

## 2023-02-19 ENCOUNTER — Ambulatory Visit
Admission: RE | Admit: 2023-02-19 | Discharge: 2023-02-19 | Disposition: A | Discharge status: Home / Self Care | Payer: Medicare Other | Source: Ambulatory Visit | Attending: Obstetrics and Gynecology | Admitting: Obstetrics and Gynecology

## 2023-02-19 DIAGNOSIS — C50111 Malignant neoplasm of central portion of right female breast: Secondary | ICD-10-CM

## 2023-02-19 MED ORDER — GADOPICLENOL 0.5 MMOL/ML IV SOLN
5.0000 mL | Freq: Once | INTRAVENOUS | Status: AC | PRN
  Administered 2023-02-19: 5 mL via INTRAVENOUS

## 2023-03-28 ENCOUNTER — Ambulatory Visit: Payer: Self-pay | Admitting: Family Medicine

## 2023-03-28 NOTE — Telephone Encounter (Signed)
  Chief Complaint: urinary symptoms Symptoms: orange/amber colored urine, urinary urgency, decreased output in the morning when waking up, fatigue Frequency: intermittent x couple of weeks Pertinent Negatives: Patient denies blood in urine, bladder pain, fever, flank pain, new medication Disposition: [] ED /[] Urgent Care (no appt availability in office) / [x] Appointment(In office/virtual)/ []  [place]/ [] Home Care/ [] Refused Recommended Disposition /[] [place] [facility]/ []  Follow-up with PCP Additional Notes: Patient states she is out of town all day tomorrow, first available she can come in is Thursday. Patient scheduled with her PCP and verbalizes understanding to call back or go to urgent care for any worsening symptoms.   Copied from CRM [id number]. Topic: Clinical - Red Word Triage >> Mar 28, 2023  3:20 PM Russell PARAS wrote: Red Word that prompted transfer to Nurse Triage: Patient has been having fatigue and intermittent dark and foamy urine for a couple weeks. No pain or other symptoms. Havent had any recent illnesses. Feels her history of thyroid  issues may be causing some of the symptoms. Reason for Disposition  Bad or foul-smelling urine  Answer Assessment - Initial Assessment Questions 1. SYMPTOM: What's the main symptom you're concerned about? (e.g., frequency, incontinence)     Dark color (sometimes like a orange to dark amber) and urine appears foamy.  2. ONSET: When did the  symptoms  start?     Intermittent x couple of weeks.  3. PAIN: Is there any pain? If Yes, ask: How bad is it? (Scale: 1-10; mild, moderate, severe)     Denies.  4. CAUSE: What do you think is causing the symptoms?     Unsure.  5. OTHER SYMPTOMS: Do you have any other symptoms? (e.g., blood in urine, fever, flank pain, pain with urination)     Fatigue, decreased urine output in the morning, sensation that bladder is not emptying, urinary urgency sometimes.  Protocols used:  Urinary Symptoms-A-AH

## 2023-03-30 ENCOUNTER — Ambulatory Visit: Payer: Medicare Other | Admitting: Family Medicine

## 2023-03-30 ENCOUNTER — Encounter: Payer: Self-pay | Admitting: Family Medicine

## 2023-03-30 VITALS — BP 120/80 | HR 62 | Temp 98.1°F | Resp 16 | Ht 67.0 in | Wt 114.6 lb

## 2023-03-30 DIAGNOSIS — E039 Hypothyroidism, unspecified: Secondary | ICD-10-CM | POA: Diagnosis not present

## 2023-03-30 DIAGNOSIS — R5383 Other fatigue: Secondary | ICD-10-CM

## 2023-03-30 DIAGNOSIS — R8290 Unspecified abnormal findings in urine: Secondary | ICD-10-CM | POA: Diagnosis not present

## 2023-03-30 LAB — POC URINALSYSI DIPSTICK (AUTOMATED)
Bilirubin, UA: NEGATIVE
Blood, UA: NEGATIVE
Glucose, UA: NEGATIVE
Ketones, UA: NEGATIVE
Nitrite, UA: NEGATIVE
Protein, UA: NEGATIVE
Spec Grav, UA: <=1.005 — AB (ref 1.010–1.025)
Urobilinogen, UA: 0.2 U/dL
pH, UA: 8 (ref 5.0–8.0)

## 2023-03-30 LAB — CBC WITH DIFFERENTIAL/PLATELET
Basophils Absolute: 0 10*3/uL (ref 0.0–0.1)
Basophils Relative: 0.4 % (ref 0.0–3.0)
Eosinophils Absolute: 0.2 10*3/uL (ref 0.0–0.7)
Eosinophils Relative: 3.8 % (ref 0.0–5.0)
HCT: 36.9 % (ref 36.0–46.0)
Hemoglobin: 12.4 g/dL (ref 12.0–15.0)
Lymphocytes Relative: 26.3 % (ref 12.0–46.0)
Lymphs Abs: 1.3 10*3/uL (ref 0.7–4.0)
MCHC: 33.6 g/dL (ref 30.0–36.0)
MCV: 96.6 fL (ref 78.0–100.0)
Monocytes Absolute: 0.4 10*3/uL (ref 0.1–1.0)
Monocytes Relative: 7.7 % (ref 3.0–12.0)
Neutro Abs: 3.1 10*3/uL (ref 1.4–7.7)
Neutrophils Relative %: 61.8 % (ref 43.0–77.0)
Platelets: 168 10*3/uL (ref 150.0–400.0)
RBC: 3.81 Mil/uL — ABNORMAL LOW (ref 3.87–5.11)
RDW: 12.8 % (ref 11.5–15.5)
WBC: 4.9 10*3/uL (ref 4.0–10.5)

## 2023-03-30 LAB — COMPREHENSIVE METABOLIC PANEL
ALT: 37 U/L — ABNORMAL HIGH (ref 0–35)
AST: 28 U/L (ref 0–37)
Albumin: 4.1 g/dL (ref 3.5–5.2)
Alkaline Phosphatase: 54 U/L (ref 39–117)
BUN: 14 mg/dL (ref 6–23)
CO2: 31 meq/L (ref 19–32)
Calcium: 9.3 mg/dL (ref 8.4–10.5)
Chloride: 100 meq/L (ref 96–112)
Creatinine, Ser: 0.51 mg/dL (ref 0.40–1.20)
GFR: 98.06 mL/min (ref >60.00)
Glucose, Bld: 78 mg/dL (ref 70–99)
Potassium: 4 meq/L (ref 3.5–5.1)
Sodium: 138 meq/L (ref 135–145)
Total Bilirubin: 0.5 mg/dL (ref 0.2–1.2)
Total Protein: 6.2 g/dL (ref 6.0–8.3)

## 2023-03-30 LAB — TSH: TSH: 2.76 u[IU]/mL (ref 0.35–5.50)

## 2023-03-30 LAB — VITAMIN B12: Vitamin B-12: 268 pg/mL (ref 211–911)

## 2023-03-30 NOTE — Patient Instructions (Signed)
 Urinary Frequency, Adult Urinary frequency means urinating more often than usual. You may urinate every 1-2 hours even though you drink a normal amount of fluid and do not have a bladder infection or condition. Although you urinate more often than normal, the total amount of urine produced in a day is normal. With urinary frequency, you may have an urgent need to urinate often. The stress and anxiety of needing to find a bathroom quickly can make this urge worse. This condition may go away on its own, or you may need treatment at home. Home treatment may include bladder training, exercises, taking medicines, or making changes to your diet. Follow these instructions at home: Bladder health Your health care provider will tell you what to do to improve bladder health. You may be told to: Keep a bladder diary. Keep track of: What you eat and drink. How often you urinate. How much you urinate. Follow a bladder training program. This may include: Learning to delay going to the bathroom. Double urinating, also called voiding. This helps if you are not completely emptying your bladder. Scheduled voiding. Do Kegel exercises. Kegel exercises strengthen the muscles that help control urination, which may help the condition.  Eating and drinking Follow instructions from your health care provider about eating or drinking restrictions. You may be told to: Avoid caffeine. Drink fewer fluids, especially alcohol. Avoid drinking in the evening. Avoid foods or drinks that may irritate the bladder. These include coffee, tea, soda, artificial sweeteners, citrus, tomato-based foods, and chocolate. Eat foods that help prevent or treat constipation. Constipation can make urinary frequency worse. You may need to take these actions to prevent or treat constipation: Drink enough fluid to keep your urine pale yellow. Take over-the-counter or prescription medicines. Eat foods that are high in fiber, such as beans, whole  grains, and fresh fruits and vegetables. Limit foods that are high in fat and processed sugars, such as fried or sweet foods. General instructions Take over-the-counter and prescription medicines only as told by your health care provider. Keep all follow-up visits. This is important. Contact a health care provider if: You start urinating more often. You feel pain or irritation when you urinate. You notice blood in your urine. Your urine looks cloudy. You develop a fever. You begin vomiting. Get help right away if: You are unable to urinate. Summary Urinary frequency means urinating more often than usual. With urinary frequency, you may urinate every 1-2 hours even though you drink a normal amount of fluid and do not have a bladder infection or other bladder condition. Your health care provider may recommend that you keep a bladder diary, follow a bladder training program, or make dietary changes. If told by your health care provider, do Kegel exercises to strengthen the muscles that help control urination. Take over-the-counter and prescription medicines only as told by your health care provider. Contact a health care provider if your symptoms do not improve or get worse. This information is not intended to replace advice given to you by your health care provider. Make sure you discuss any questions you have with your health care provider. Document Revised: 08/15/2019 Document Reviewed: 09/13/2019 Elsevier Patient Education  2024 [employer].

## 2023-03-30 NOTE — Assessment & Plan Note (Signed)
 Check urine culture  Dip inconclusive

## 2023-03-30 NOTE — Progress Notes (Signed)
 Established Patient Office Visit  Subjective   Patient ID: Kathryn Mcdaniel, female    DOB: March 22, 1955  Age: 68 y.o. MRN: [id number]  Chief Complaint  Patient presents with   Smelly urine    X2 weeks, pt states no pain just fatigue and color change and foamy.     HPI Discussed the use of AI scribe software for clinical note transcription with the patient, who gave verbal consent to proceed.  History of Present Illness   Kathryn Mcdaniel is a 68 year old female with hypothyroidism who presents with fatigue and urinary symptoms.  She has been experiencing significant fatigue for the past couple of weeks, describing it as feeling 'blah' and as if she could 'sleep all the time.' She also mentions occasional slight headaches located at the top of her head, which are sometimes relieved by ibuprofen. Additionally, she experiences dizziness at times. No sore throat, congestion, fevers, stomach pain, or back pain. The weather seems to exacerbate these symptoms.  She notes changes in her urine, describing it as dark, foamy, and having an odor. She denies any burning sensation during urination or increased frequency, although she sometimes feels an urgent need to urinate. She reports drinking enough water. No discharge noted.  Her past medical history includes hypothyroidism, and she mentions that she is due for a thyroid  check. Her thyroid  was last checked in March, and the results were normal at that time.      Patient Active Problem List   Diagnosis Date Noted   Abnormal urine odor 03/30/2023   Hx of sessile serrated colonic polyp 10/18/2021   Preventative health care 04/18/2021   Urinary frequency 08/28/2020   H/O intermittent claudication 06/17/2019   Raynaud's disease without gangrene 06/17/2019   History of chemotherapy 06/17/2019   Abnormal left toe brachial index 06/17/2019   History of radiation therapy 06/17/2019   Former smoker 06/17/2019   Bilateral carotid artery stenosis  06/17/2019   Bilateral leg pain 06/04/2019   Varicose veins of bilateral lower extremities with pain 06/04/2019   Abnormal carotid pulse 06/04/2019   Depression with anxiety 11/09/2017   Fatigue 11/09/2017   Influenza vaccine administered 11/09/2017   Influenza 01/10/2017   Hypothyroid 10/10/2015   Numbness in both hands 05/19/2014   Neuroma 08/21/2013   Breast cancer, right breast (HCC) 01/05/2012   Shingles 07/16/2011   Osteopenia 01/06/2011   CHEST PAIN 09/21/2007   OTHER MALAISE AND FATIGUE 04/20/2007   HUMAN PAPILLOMAVIRUS 04/19/2007   Malignant neoplasm of female breast (HCC) 04/19/2007   PERSONAL HISTORY OF CERVICAL DYSPLASIA 04/19/2007   Unspecified personal history presenting hazards to health 04/19/2007   MASTECTOMY, RIGHT, HX OF 04/19/2007   Past Medical History:  Diagnosis Date   Abnormal breast biopsy    Basal cell carcinoma of skin    left temple   Breast cancer (HCC) 2007   RIGHT 2007-2008   Depression    Human papillomavirus in conditions classified elsewhere and of unspecified site    Hx of sessile serrated colonic polyp 10/18/2021   Malignant neoplasm of breast (female), unspecified site    infiltrating ductal,right breast   Personal history of cervical dysplasia    Personal history of chemotherapy    Personal history of radiation therapy    Thyroid  disease    Unspecified personal history presenting hazards to health    Radiation Therapy   Past Surgical History:  Procedure Laterality Date   BREAST BIOPSY Right 2007   BREAST SURGERY  right mastectomy   COLONOSCOPY  11/10/2009   MASS EXCISION Right 08/09/2013   Procedure: MINOR EXCISION OF MASS CHEST WALL;  Surgeon: Elon CHRISTELLA Pacini, MD;  Location: [facility];  Service: General;  Laterality: Right;   MASTECTOMY Right 2007   NASAL SINUS SURGERY     sinus stripping   Social History   Tobacco Use   Smoking status: Former    Current packs/day: 0.00    Types: Cigarettes    Quit  date: 09/15/1984    Years since quitting: 38.5   Smokeless tobacco: Never  Vaping Use   Vaping status: Never Used  Substance Use Topics   Alcohol use: Yes    Alcohol/week: 2.0 standard drinks of alcohol    Types: 2 Cans of beer per week   Drug use: No   Social History   Socioeconomic History   Marital status: Single    Spouse name: Not on file   Number of children: Not on file   Years of education: Not on file   Highest education level: Not on file  Occupational History   Occupation: self employed    Comment: airline pilot  Tobacco Use   Smoking status: Former    Current packs/day: 0.00    Types: Cigarettes    Quit date: 09/15/1984    Years since quitting: 38.5   Smokeless tobacco: Never  Vaping Use   Vaping status: Never Used  Substance and Sexual Activity   Alcohol use: Yes    Alcohol/week: 2.0 standard drinks of alcohol    Types: 2 Cans of beer per week   Drug use: No   Sexual activity: Never  Other Topics Concern   Not on file  Social History Narrative   SingleWork; it consultant, has a dog walking businessExercise-- swim, walking dogs and bike   Retired from museum/gallery conservator   Social Drivers of Corporate Investment Banker Strain: Not on file  Food Insecurity: Not on file  Transportation Needs: Not on file  Physical Activity: Not on file  Stress: Not on file  Social Connections: Not on file  Intimate Partner Violence: Not on file   Family Status  Relation Name Status   Mother  Other       1921   Father  Deceased       62   Sister  Alive   Brother  Deceased       diving accident   MGM  (Not Specified)   PGM  (Not Specified)   Other  (Not Specified)   Neg Hx  (Not Specified)  No partnership data on file   Family History  Problem Relation Age of Onset   Breast cancer Mother    Squamous cell carcinoma Mother        sinus   Transient ischemic attack Mother    Cancer Father        prostate   Hypertension Father    Diabetes Father    Breast  cancer Sister    Stroke Maternal Grandmother    Breast cancer Paternal Grandmother    Cancer Other        grandmother, breast cancer   Colon cancer Neg Hx    Coronary artery disease Neg Hx    Colon polyps Neg Hx    Esophageal cancer Neg Hx    Rectal cancer Neg Hx    Stomach cancer Neg Hx    Allergies  Allergen Reactions   Compazine [Prochlorperazine Edisylate]  bizarre, dystonic   Metoclopramide Hcl Other (See Comments)    Dystonia       Review of Systems  Constitutional:  Negative for chills, fever and malaise/fatigue.  HENT:  Negative for congestion and hearing loss.   Eyes:  Negative for blurred vision and discharge.  Respiratory:  Negative for cough, sputum production and shortness of breath.   Cardiovascular:  Negative for chest pain, palpitations and leg swelling.  Gastrointestinal:  Negative for abdominal pain, blood in stool, constipation, diarrhea, heartburn, nausea and vomiting.  Genitourinary:  Negative for dysuria, frequency, hematuria and urgency.  Musculoskeletal:  Negative for back pain, falls and myalgias.  Skin:  Negative for rash.  Neurological:  Negative for dizziness, sensory change, loss of consciousness, weakness and headaches.  Endo/Heme/Allergies:  Negative for environmental allergies. Does not bruise/bleed easily.  Psychiatric/Behavioral:  Negative for depression and suicidal ideas. The patient is not nervous/anxious and does not have insomnia.       Objective:     BP 120/80 (BP Location: Left Arm, Patient Position: Sitting, Cuff Size: Normal)   Pulse 62   Temp 98.1 F (36.7 C) (Oral)   Resp 16   Ht 5' 7 (1.702 m)   Wt 114 lb 9.6 oz (52 kg)   SpO2 94%   BMI 17.95 kg/m  BP Readings from Last 3 Encounters:  03/30/23 120/80  05/05/22 114/80  10/12/21 (!) 140/54   Wt Readings from Last 3 Encounters:  03/30/23 114 lb 9.6 oz (52 kg)  05/05/22 110 lb 6.4 oz (50.1 kg)  10/12/21 109 lb (49.4 kg)   SpO2 Readings from Last 3 Encounters:   03/30/23 94%  05/05/22 99%  10/12/21 95%      Physical Exam Vitals and nursing note reviewed.      Results for orders placed or performed in visit on 03/30/23  POCT Urinalysis Dipstick (Automated)  Result Value Ref Range   Color, UA yellow    Clarity, UA clear    Glucose, UA Negative Negative   Bilirubin, UA negative    Ketones, UA negative    Spec Grav, UA <=1.005 (A) 1.010 - 1.025   Blood, UA negative    pH, UA 8.0 5.0 - 8.0   Protein, UA Negative Negative   Urobilinogen, UA 0.2 0.2 or 1.0 E.U./dL   Nitrite, UA negative    Leukocytes, UA Trace (A) Negative    Last CBC Lab Results  Component Value Date   WBC 5.2 05/05/2022   HGB 12.6 05/05/2022   HCT 37.4 05/05/2022   MCV 94.3 05/05/2022   MCH 32.0 05/06/2014   RDW 12.7 05/05/2022   PLT 190.0 05/05/2022   Last metabolic panel Lab Results  Component Value Date   GLUCOSE 72 05/05/2022   NA 139 05/05/2022   K 4.0 05/05/2022   CL 102 05/05/2022   CO2 29 05/05/2022   BUN 19 05/05/2022   CREATININE 0.55 05/05/2022   GFR 96.90 05/05/2022   CALCIUM  9.8 05/05/2022   PROT 6.5 05/05/2022   ALBUMIN 4.1 05/05/2022   BILITOT 0.5 05/05/2022   ALKPHOS 53 05/05/2022   AST 20 05/05/2022   ALT 21 05/05/2022   ANIONGAP 6 03/08/2014   Last lipids Lab Results  Component Value Date   CHOL 173 05/05/2022   HDL 74.80 05/05/2022   LDLCALC 83 05/05/2022   TRIG 76.0 05/05/2022   CHOLHDL 2 05/05/2022   Last hemoglobin A1c No results found for: HGBA1C Last thyroid  functions Lab Results  Component Value Date  TSH 2.89 05/05/2022   T4TOTAL 7.1 11/09/2017   Last vitamin D  Lab Results  Component Value Date   VD25OH 46.71 10/18/2016   Last vitamin B12 and Folate Lab Results  Component Value Date   VITAMINB12 523 11/09/2017      The 10-year ASCVD risk score (Arnett DK, et al., 2019) is: 4%    Assessment & Plan:   Problem List Items Addressed This Visit       Unprioritized   Fatigue   Relevant  Orders   CBC with Differential/Platelet   Comprehensive metabolic panel   TSH   Vitamin B12   Hypothyroid   Relevant Orders   TSH   Abnormal urine odor - Primary   Check urine culture  Dip inconclusive       Relevant Orders   POCT Urinalysis Dipstick (Automated) (Completed)   Urine Culture  Assessment and Plan    Fatigue Experiencing significant fatigue for the past couple of weeks, accompanied by slight headaches and occasional dizziness, which affect daily functioning. Differential diagnosis includes hypothyroidism and potential UTI. Order thyroid  function tests and send urine for culture. Monitor symptoms and report any worsening, especially over the weekend.  Hypothyroidism Due for a thyroid  check. Previous tests in March were normal, but symptoms persist. Discussed the possibility of hypothyroidism causing symptoms and the need for updated tests. Order thyroid  function tests.  Possible Urinary Tract Infection (UTI) Reports dark, foamy urine with an odor, without burning sensation or increased frequency. Urinalysis shows a small amount of leukocytes. Differential diagnosis includes UTI. Send urine for culture and monitor symptoms over the weekend. Informed about potential need for antibiotics if symptoms worsen.  General Health Maintenance Scheduled for a first Medicare visit next month.  Follow-up Follow up on Monday for urine culture results. Avoid ERs and urgent care due to high flu activity.        No follow-ups on file.    Timothy Townsel R Lowne Chase, DO

## 2023-03-31 ENCOUNTER — Encounter: Payer: Self-pay | Admitting: Family Medicine

## 2023-03-31 LAB — URINE CULTURE
MICRO NUMBER:: 16050543
Result:: NO GROWTH
SPECIMEN QUALITY:: ADEQUATE

## 2023-05-08 ENCOUNTER — Ambulatory Visit (INDEPENDENT_AMBULATORY_CARE_PROVIDER_SITE_OTHER): Payer: Medicare Other | Admitting: Family Medicine

## 2023-05-08 ENCOUNTER — Encounter: Payer: Self-pay | Admitting: Family Medicine

## 2023-05-08 VITALS — BP 98/62 | HR 59 | Temp 97.6°F | Resp 16 | Ht 67.0 in | Wt 109.2 lb

## 2023-05-08 DIAGNOSIS — E039 Hypothyroidism, unspecified: Secondary | ICD-10-CM

## 2023-05-08 DIAGNOSIS — Z23 Encounter for immunization: Secondary | ICD-10-CM

## 2023-05-08 DIAGNOSIS — Z0000 Encounter for general adult medical examination without abnormal findings: Secondary | ICD-10-CM | POA: Diagnosis not present

## 2023-05-08 MED ORDER — LEVOTHYROXINE SODIUM 100 MCG PO TABS: 100.0000 ug | ORAL_TABLET | Freq: Every day | ORAL | 1 refills | Status: DC

## 2023-05-08 NOTE — Progress Notes (Signed)
 ---------------  Subjective:    Kathryn Mcdaniel is a 68 y.o. female who presents for a Welcome to Medicare exam.     Discussed the use of AI scribe software for clinical note transcription with the patient, who gave verbal consent to proceed.  History of Present Illness   Kathryn Mcdaniel is a 68 year old female who presents for a Welcome to Medicare visit.  She has experienced gradual, unintentional weight loss over the past year, with her weight decreasing from 114 pounds last month to 109 pounds currently. This weight loss has occurred despite maintaining a high-calorie diet that includes foods like peanut butter, almond butter, avocados, and consuming two entrees at meals. No changes in appetite or eating habits.  She experiences persistent fatigue despite normal blood work results from last month. Hemoglobin levels are within normal limits, but B12 levels were low normal. She takes a B complex vitamin daily. Thyroid function tests were normal.  She has a history of a hiatal hernia, which has not caused significant symptoms such as pain, food getting stuck, or heartburn.  She has a chronic HPV infection and undergoes regular Pap smears and mammograms. She has had a colonoscopy with a normal polyp finding.  She is up to date with mammograms and bone density tests. She has not received the flu shot or shingles vaccine this year and is eligible for the pneumonia vaccine. She engages in physical activities such as swimming, walking, and biking, and is considering adding weight training. 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 No changes in family history reported. No falls, depression, or memory issues. Occasional joint aches, particularly in the knees, but no significant joint issues. No concerns about moles and had a full checkup in December with a biopsy that was fine.          Objective:    Today's Vitals   05/08/23 1416  BP: 98/62  Pulse: (!) 59   Resp: 16  Temp: 97.6 F (36.4 C)  TempSrc: Oral  Weight: 109 lb 3.2 oz (49.5 kg)  Height: 5\' 7"  (1.702 m)  Body mass index is 17.1 kg/m.  Medications Outpatient Encounter Medications as of 05/08/2023  Medication Sig   ALPRAZolam (XANAX) 0.5 MG tablet Take 1 tablet (0.5 mg total) by mouth at bedtime as needed.   Ascorbic Acid (VITAMIN C) 1000 MG tablet Take 1,000 mg by mouth daily.   Calcium Carbonate-Vitamin D (CALTRATE 600+D) 600-400 MG-UNIT per tablet Take 1 tablet by mouth 2 (two) times daily.   FLUoxetine (PROZAC) 40 MG capsule Take 40 mg by mouth 2 (two) times daily.   Multiple Vitamin (MULTIVITAMIN) tablet Take 1 tablet by mouth daily.   vitamin E 400 UNIT capsule Take 400 Units by mouth daily.   [DISCONTINUED] levothyroxine (SYNTHROID) 100 MCG tablet Take 1 tablet (100 mcg total) by mouth daily before breakfast.   levothyroxine (SYNTHROID) 100 MCG tablet Take 1 tablet (100 mcg total) by mouth daily before breakfast.   No facility-administered encounter medications on file as of 05/08/2023.     History: Past Medical History:  Diagnosis Date   Abnormal breast biopsy    Basal cell carcinoma of skin    left temple   Breast cancer (HCC) 2007   RIGHT 2007-2008   Depression    Human papillomavirus in conditions classified elsewhere and of unspecified site    Hx of sessile serrated colonic polyp 10/18/2021   Malignant neoplasm of breast (female), unspecified site  infiltrating ductal,right breast   Personal history of cervical dysplasia    Personal history of chemotherapy    Personal history of radiation therapy    Thyroid disease    Unspecified personal history presenting hazards to health    Radiation Therapy   Past Surgical History:  Procedure Laterality Date   BREAST BIOPSY Right 2007   BREAST SURGERY     right mastectomy   COLONOSCOPY  11/10/2009   MASS EXCISION Right 08/09/2013   Procedure: MINOR EXCISION OF MASS CHEST WALL;  Surgeon: Ernestene Mention, MD;   Location: [facility];  Service: General;  Laterality: Right;   MASTECTOMY Right 2007   NASAL SINUS SURGERY     sinus stripping    Family History  Problem Relation Age of Onset   Breast cancer Mother    Squamous cell carcinoma Mother        sinus   Transient ischemic attack Mother    Cancer Father        prostate   Hypertension Father    Diabetes Father    Breast cancer Sister    Stroke Maternal Grandmother    Breast cancer Paternal Grandmother    Cancer Other        grandmother, breast cancer   Colon cancer Neg Hx    Coronary artery disease Neg Hx    Colon polyps Neg Hx    Esophageal cancer Neg Hx    Rectal cancer Neg Hx    Stomach cancer Neg Hx    Social History   Occupational History   Occupation: self employed    Comment: Airline pilot  Tobacco Use   Smoking status: Former    Current packs/day: 0.00    Types: Cigarettes    Quit date: 09/15/1984    Years since quitting: 38.6   Smokeless tobacco: Never  Vaping Use   Vaping status: Never Used  Substance and Sexual Activity   Alcohol use: Yes    Alcohol/week: 2.0 standard drinks of alcohol    Types: 2 Cans of beer per week   Drug use: No   Sexual activity: Never    Tobacco Counseling Counseling given: Not Answered   Immunizations and Health Maintenance Immunization History  Administered Date(s) Administered   Influenza Inj Mdck Quad Pf 12/08/2018   Influenza,inj,Quad PF,6+ Mos 11/09/2017, 03/05/2020   Influenza-Unspecified 11/17/2020   PFIZER Comirnaty(Gray Top)Covid-19 Tri-Sucrose Vaccine 10/04/2020   PFIZER(Purple Top)SARS-COV-2 Vaccination 05/11/2019, 06/01/2019, 01/22/2020   PNEUMOCOCCAL CONJUGATE-20 05/08/2023   Td 04/23/2006   Tdap 03/04/2019   Tetanus 07/23/2006   Health Maintenance Due  Topic Date Due   HIV Screening  Never done   Zoster Vaccines- Shingrix (1 of 2) Never done   Cervical Cancer Screening (HPV/Pap Cotest)  Never done    Activities of Daily Living    05/03/2023     6:17 PM  In your present state of health, do you have any difficulty performing the following activities:  Hearing? 0  Vision? 0  Difficulty concentrating or making decisions? 0  Walking or climbing stairs? 0  Dressing or bathing? 0  Doing errands, shopping? 0  Preparing Food and eating ? N  Using the Toilet? N  In the past six months, have you accidently leaked urine? N  Do you have problems with loss of bowel control? N  Managing your Medications? N  Managing your Finances? N  Housekeeping or managing your Housekeeping? N    Physical Exam   Physical Exam Vitals and nursing  note reviewed.  Constitutional:      General: She is not in acute distress.    Appearance: Normal appearance. She is well-developed.  HENT:     Head: Normocephalic and atraumatic.     Right Ear: Tympanic membrane, ear canal and external ear normal. There is no impacted cerumen.     Left Ear: Tympanic membrane, ear canal and external ear normal. There is no impacted cerumen.     Nose: Nose normal.     Mouth/Throat:     Mouth: Mucous membranes are moist.     Pharynx: Oropharynx is clear. No oropharyngeal exudate or posterior oropharyngeal erythema.  Eyes:     General: No scleral icterus.       Right eye: No discharge.        Left eye: No discharge.     Conjunctiva/sclera: Conjunctivae normal.     Pupils: Pupils are equal, round, and reactive to light.  Neck:     Thyroid: No thyromegaly or thyroid tenderness.     Vascular: No JVD.  Cardiovascular:     Rate and Rhythm: Normal rate and regular rhythm.     Heart sounds: Normal heart sounds. No murmur heard. Pulmonary:     Effort: Pulmonary effort is normal. No respiratory distress.     Breath sounds: Normal breath sounds.  Abdominal:     General: Bowel sounds are normal. There is no distension.     Palpations: Abdomen is soft. There is no mass.     Tenderness: There is no abdominal tenderness. There is no guarding or rebound.  Genitourinary:     Vagina: Normal.  Musculoskeletal:        General: Normal range of motion.     Cervical back: Normal range of motion and neck supple.     Right lower leg: No edema.     Left lower leg: No edema.  Lymphadenopathy:     Cervical: No cervical adenopathy.  Skin:    General: Skin is warm and dry.     Findings: No erythema or rash.  Neurological:     Mental Status: She is alert and oriented to person, place, and time.     Cranial Nerves: No cranial nerve deficit.     Deep Tendon Reflexes: Reflexes are normal and symmetric.  Psychiatric:        Mood and Affect: Mood normal.        Behavior: Behavior normal.        Thought Content: Thought content normal.        Judgment: Judgment normal.      Advanced Directives: Does Patient Have a Medical Advance Directive?: Yes Type of Advance Directive: Healthcare Power of Attorney, Living will Does patient want to make changes to medical advance directive?: Yes (MAU/Ambulatory/Procedural Areas - Information given) Copy of Healthcare Power of Attorney in Chart?: No - copy requested  EKG:  unchanged from previous tracings      Assessment:    This is a routine wellness examination for this patient . Assessment and Plan    Unintentional Weight Loss   She has experienced gradual weight loss over the past year, recently dropping from 114 lbs to 109 lbs despite adequate food intake. There are no signs of major cancer or urinary symptoms, and her urinalysis and recent screenings are normal. A nutritional deficiency or metabolic issue is possible. Recommend high-calorie protein shakes between meals to increase caloric intake. Consider referral to a nutritionist if weight loss continues. Re-evaluate weight in one  month.  Fatigue   She experiences persistent fatigue with no clear etiology. Recent blood work showed low normal B12 levels, but thyroid function is normal and there are no signs of anemia. Consider increasing B12 intake, possibly with sublingual  supplements.  Chronic HPV Infection   She has a chronic HPV infection with regular Pap smears and no current issues reported.  Hiatal Hernia   She has a known hiatal hernia with no reported symptoms such as pain, food obstruction, or heartburn.  General Health Maintenance   Vaccinations and screenings were discussed. She has not had a recent flu shot or shingles vaccine and is eligible for the pneumonia vaccine. Regular mammograms and bone density tests are up to date. Cataract surgery is scheduled for next month. Administer pneumonia vaccine. Recommend obtaining shingles vaccine at the pharmacy. Encourage getting a flu shot. Perform vision test and EKG as part of the Medicare wellness exam.  Goals of Care   She is open to updating her living will and power of attorney to ensure her preferences are respected. Provide advanced directive forms for her to update these documents.        Vision/Hearing screen Vision Screening   Right eye Left eye Both eyes  Without correction     With correction 20/20 20/20 20/20   Hearing Screening - Comments:: Normal whisper test     Goals   None     Depression Screen    05/08/2023    2:19 PM 05/05/2022    1:52 PM 04/15/2021    2:16 PM 03/05/2020    1:07 PM  PHQ 2/9 Scores  PHQ - 2 Score 0 2 1 0  PHQ- 9 Score  8       Fall Risk    05/08/2023    5:36 PM  Fall Risk   Falls in the past year? 0  Number falls in past yr: 0  Injury with Fall? 0  Risk for fall due to : No Fall Risks  Follow up Falls evaluation completed    Cognitive Function:    05/08/2023    5:39 PM  MMSE - Mini Mental State Exam  Orientation to time 5  Orientation to Place 5  Registration 3  Attention/ Calculation 5  Recall 3  Language- name 2 objects 2  Language- repeat 1  Language- follow 3 step command 3  Language- read & follow direction 1  Write a sentence 1  Copy design 1  Total score 30        Patient Care Team: Donato Schultz, DO as PCP -  General (Family Medicine) Olivia Mackie, MD as Consulting Physician (Obstetrics and Gynecology) de Freida Busman, [place], OD as Consulting Physician (Optometry) Elmon Else, MD as Consulting Physician (Dermatology) Milagros Evener, MD as Consulting Physician (Psychiatry) Thomasene Ripple, DO as Consulting Physician (Cardiology)     Plan:    Cpe  I have personally reviewed and noted the following in the patient's chart:   Medical and social history Use of alcohol, tobacco or illicit drugs  Current medications and supplements Functional ability and status Nutritional status Physical activity Advanced directives List of other physicians Hospitalizations, surgeries, and ER visits in previous 12 months Vitals Screenings to include cognitive, depression, and falls Referrals and appointments  In addition, I have reviewed and discussed with patient certain preventive protocols, quality metrics, and best practice recommendations. A written personalized care plan for preventive services as well as general preventive health recommendations were provided to patient. Assessment and Plan  Unintentional Weight Loss   She has experienced gradual weight loss over the past year, recently dropping from 114 lbs to 109 lbs despite adequate food intake. There are no signs of major cancer or urinary symptoms, and her urinalysis and recent screenings are normal. A nutritional deficiency or metabolic issue is possible. Recommend high-calorie protein shakes between meals to increase caloric intake. Consider referral to a nutritionist if weight loss continues. Re-evaluate weight in one month.  Fatigue   She experiences persistent fatigue with no clear etiology. Recent blood work showed low normal B12 levels, but thyroid function is normal and there are no signs of anemia. Consider increasing B12 intake, possibly with sublingual supplements.  Chronic HPV Infection   She has a chronic HPV infection with regular Pap  smears and no current issues reported.  Hiatal Hernia   She has a known hiatal hernia with no reported symptoms such as pain, food obstruction, or heartburn.  General Health Maintenance   Vaccinations and screenings were discussed. She has not had a recent flu shot or shingles vaccine and is eligible for the pneumonia vaccine. Regular mammograms and bone density tests are up to date. Cataract surgery is scheduled for next month. Administer pneumonia vaccine. Recommend obtaining shingles vaccine at the pharmacy. Encourage getting a flu shot. Perform vision test and EKG as part of the Medicare wellness exam.  Goals of Care   She is open to updating her living will and power of attorney to ensure her preferences are respected. Provide advanced directive forms for her to update these documents.          Lelon Perla Chase, DO 05/08/2023

## 2023-08-08 ENCOUNTER — Other Ambulatory Visit: Payer: Self-pay | Admitting: Family Medicine

## 2023-08-08 DIAGNOSIS — Z1231 Encounter for screening mammogram for malignant neoplasm of breast: Secondary | ICD-10-CM

## 2023-09-06 ENCOUNTER — Ambulatory Visit
Admission: RE | Admit: 2023-09-06 | Discharge: 2023-09-06 | Disposition: A | Discharge status: Home / Self Care | Source: Ambulatory Visit | Attending: Family Medicine | Admitting: Family Medicine

## 2023-09-06 DIAGNOSIS — Z1231 Encounter for screening mammogram for malignant neoplasm of breast: Secondary | ICD-10-CM

## 2023-10-31 ENCOUNTER — Other Ambulatory Visit: Payer: Self-pay | Admitting: Family Medicine

## 2023-10-31 DIAGNOSIS — E039 Hypothyroidism, unspecified: Secondary | ICD-10-CM

## 2023-11-27 ENCOUNTER — Other Ambulatory Visit: Payer: Self-pay | Admitting: Family Medicine

## 2023-11-27 DIAGNOSIS — E039 Hypothyroidism, unspecified: Secondary | ICD-10-CM

## 2023-11-29 ENCOUNTER — Ambulatory Visit (INDEPENDENT_AMBULATORY_CARE_PROVIDER_SITE_OTHER): Admitting: Family Medicine

## 2023-11-29 ENCOUNTER — Ambulatory Visit: Payer: Self-pay | Admitting: *Deleted

## 2023-11-29 VITALS — BP 95/63 | HR 83 | Ht 67.0 in | Wt 110.0 lb

## 2023-11-29 DIAGNOSIS — L089 Local infection of the skin and subcutaneous tissue, unspecified: Secondary | ICD-10-CM

## 2023-11-29 MED ORDER — SULFAMETHOXAZOLE-TRIMETHOPRIM 800-160 MG PO TABS: 1.0000 | ORAL_TABLET | Freq: Two times a day (BID) | ORAL | 0 refills | Status: AC

## 2023-11-29 NOTE — Progress Notes (Unsigned)
 Acute Office Visit  Subjective:     Patient ID: Kathryn Mcdaniel, female    DOB: May 24, 1955, 68 y.o.   MRN: [id number]  Chief Complaint  Patient presents with   Hand Pain    Hand Pain    Patient is in today for ***   Discussed the use of AI scribe software for clinical note transcription with the patient, who gave verbal consent to proceed.  History of Present Illness Rever Kathryn Mcdaniel is a 68 year old female who presents with acute pain and swelling in her finger.  She experienced the onset of pain and tenderness in her finger starting last night around dinnertime. There is no known injury, bug bite, or recent nail cutting as a cause.  Overnight, the pain worsened slightly, with throbbing sensations. Today, the pain is significant, preventing use of the finger without discomfort, especially under pressure. The fingertip feels swollen, with a sensation of pressure, as if it could be 'popped'.  The pain is most pronounced near the nail and around the side of the finger.  She is planning to leave town tomorrow and has no known allergies to antibiotics. She recalls a past experience with IV antibiotics for a severe cat bite but does not remember taking Bactrim before. She has a history of low blood pressure, typically in the upper 90s to low 100s, but does not experience dizziness or lightheadedness.  No fever or drainage from the finger.   ***needs review   ROS All review of systems negative except what is listed in the HPI      Objective:    BP 95/63   Pulse 83   Ht 5' 7 (1.702 m)   Wt 110 lb (49.9 kg)   SpO2 100%   BMI 17.23 kg/m  {Vitals History (Optional):23777}  Physical Exam Vitals reviewed.  Constitutional:      Appearance: Normal appearance.  Skin:    Comments: Right middle finger tip with mild erythema and edema, no open lesions, normal ROM, no flexor tendon pain or decreased ROM, no drainage  Neurological:     Mental Status: She is alert and oriented to  person, place, and time.  Psychiatric:        Mood and Affect: Mood normal.        Behavior: Behavior normal.        Thought Content: Thought content normal.        Judgment: Judgment normal.         No results found for any visits on 11/29/23.      Assessment & Plan:   Problem List Items Addressed This Visit   None Visit Diagnoses       Finger infection    -  Primary   Relevant Medications   sulfamethoxazole-trimethoprim (BACTRIM DS) 800-160 MG tablet      ***review  Assessment and Plan Assessment & Plan Paronychia of finger Acute tenderness, redness, swelling, and throbbing pain around the medial nail. Possible deeper infection under the nail bed. Bactrim chosen for efficacy and low gastrointestinal side effects due to travel plans. - Prescribe Bactrim for 5 days. - Advise warm water soaks and warm compresses. - Instruct to keep the area clean and avoid poking. - Advise to apply Neosporin and cover if drainage occurs. - Instruct to seek emergency care if flexor tendon becomes hot, red, or swollen. - Advise to monitor for fever or spreading infection despite antibiotics and seek further evaluation if needed.  Hypotension Blood pressure consistently in the  upper 90s to low 100s without symptoms. No intervention required. - Monitor blood pressure and symptoms.    Meds ordered this encounter  Medications   sulfamethoxazole-trimethoprim (BACTRIM DS) 800-160 MG tablet    Sig: Take 1 tablet by mouth 2 (two) times daily for 5 days.    Dispense:  10 tablet    Refill:  0    Supervising Provider:   DOMENICA BLACKBIRD A [[id number]]    No follow-ups on file.  Waddell KATHEE Mon, NP

## 2023-11-29 NOTE — Telephone Encounter (Signed)
 FYI Only or Action Required?: FYI only for provider.  Patient was last seen in primary care on 05/08/2023 by Antonio Meth, Jamee SAUNDERS, DO.  Called Nurse Triage reporting Finger Injury.  Symptoms began yesterday.  Interventions attempted: Nothing.  Symptoms are: gradually worsening.  Triage Disposition: See Physician Within 24 Hours  Patient/caregiver understands and will follow disposition?: yes   Reason for Disposition  Large swelling or bruise  Answer Assessment - Initial Assessment Questions 1. MECHANISM: How did the injury happen?      Unsure if injury vs nail/joint infection 2. ONSET: When did the injury happen? (e.g., minutes, hours ago)      Not sure- inflamed- could be nail related- no apparent injury to nail 3. LOCATION: What part of the finger is injured? Is the nail damaged?      Tip of middle finger - R hand 4. APPEARANCE of the INJURY: What does the injury look like?      Red, inflamed, swollen painful 5. SEVERITY: Can you use the hand normally?  Can you bend your fingers into a ball and then fully open them?     Hard to bend or put pressure on it 6. SIZE: For cuts, bruises, or swelling, ask: How large is it? (e.g., inches or centimeters;  entire finger)      Swelling- from joint to the end 7. PAIN: Is there pain? If Yes, ask: How bad is the pain?  (Scale 0-10; or none, mild, moderate, severe)     Yes- 4/10 8. TETANUS: For any breaks in the skin, ask: When was your last tetanus booster?     unsure 9. OTHER SYMPTOMS: Do you have any other symptoms?     no  Protocols used: Finger Injury-A-AH   Copied from CRM #[id number]. Topic: Clinical - Red Word Triage >> Nov 29, 2023  9:44 AM Rea BROCKS wrote: Red Word that prompted transfer to Nurse Triage: top of finger, red, painful, swollen - right hand, middle finger

## 2023-11-29 NOTE — Telephone Encounter (Signed)
 Appt scheduled

## 2023-11-30 ENCOUNTER — Encounter: Payer: Self-pay | Admitting: Family Medicine

## 2023-12-26 ENCOUNTER — Telehealth: Payer: Self-pay | Admitting: Family Medicine

## 2023-12-26 ENCOUNTER — Other Ambulatory Visit: Payer: Self-pay | Admitting: Family Medicine

## 2023-12-26 DIAGNOSIS — E039 Hypothyroidism, unspecified: Secondary | ICD-10-CM

## 2023-12-26 NOTE — Telephone Encounter (Signed)
 Patient return call and has been scheduled for  Monday 01/01/24 at 2:40 pm

## 2023-12-26 NOTE — Telephone Encounter (Signed)
 Jannis- can you try reaching out to Pt to schedule a follow-up with Dr. Antonio please? Thank you.

## 2023-12-26 NOTE — Telephone Encounter (Signed)
 Lvm to sched

## 2024-01-01 ENCOUNTER — Ambulatory Visit (INDEPENDENT_AMBULATORY_CARE_PROVIDER_SITE_OTHER): Admitting: Family Medicine

## 2024-01-01 VITALS — BP 118/70 | HR 64 | Temp 97.9°F | Resp 16 | Ht 67.0 in | Wt 113.4 lb

## 2024-01-01 DIAGNOSIS — E039 Hypothyroidism, unspecified: Secondary | ICD-10-CM | POA: Diagnosis not present

## 2024-01-01 DIAGNOSIS — Z23 Encounter for immunization: Secondary | ICD-10-CM

## 2024-01-02 LAB — THYROID PANEL WITH TSH
Free Thyroxine Index: 2.4 (ref 1.4–3.8)
T3 Uptake: 32 % (ref 22–35)
T4, Total: 7.5 ug/dL (ref 5.1–11.9)
TSH: 2.57 m[IU]/L (ref 0.40–4.50)

## 2024-01-11 ENCOUNTER — Ambulatory Visit: Payer: Self-pay | Admitting: Family Medicine

## 2024-01-11 ENCOUNTER — Encounter: Payer: Self-pay | Admitting: Family Medicine

## 2024-01-11 NOTE — Progress Notes (Signed)
 Subjective:    Patient ID: Kathryn Mcdaniel, female    DOB: [birth year], 68 y.o.   MRN: [id number]  Chief Complaint  Patient presents with   Hypothyroidism   Follow-up    HPI Patient is in today for f/u thyroid .  Discussed the use of AI scribe software for clinical note transcription with the patient, who gave verbal consent to proceed.  History of Present Illness Kathryn Mcdaniel is a 68 year old female who presents for a thyroid  check and medication refill.  She is currently taking Prozac  and alprazolam  daily. She was unaware that an appointment was necessary for the refill.  She had COVID-19 at the end of August or early September, confirmed by a home test. Symptoms included a scratchy throat and headache. She managed the illness at home without severe symptoms or hospitalization. She was concerned about potentially exposing a friend whose husband had recently undergone open heart surgery, but her friend did not contract the virus.  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 The dog is on medication for seizures, which has improved her condition, allowing her to enjoy longer walks, especially in colder weather.    Past Medical History:  Diagnosis Date   Abnormal breast biopsy    Basal cell carcinoma of skin    left temple   Breast cancer (HCC) 2007   RIGHT 2007-2008   Depression    Human papillomavirus in conditions classified elsewhere and of unspecified site    Hx of sessile serrated colonic polyp 10/18/2021   Malignant neoplasm of breast (female), unspecified site    infiltrating ductal,right breast   Personal history of cervical dysplasia    Personal history of chemotherapy    Personal history of radiation therapy    Thyroid  disease    Unspecified personal history presenting hazards to health    Radiation Therapy    Past Surgical History:  Procedure Laterality Date   BREAST BIOPSY Right 2007   BREAST SURGERY     right mastectomy   COLONOSCOPY   11/10/2009   MASS EXCISION Right 08/09/2013   Procedure: MINOR EXCISION OF MASS CHEST WALL;  Surgeon: Elon CHRISTELLA Pacini, MD;  Location: [facility];  Service: General;  Laterality: Right;   MASTECTOMY Right 2007   NASAL SINUS SURGERY     sinus stripping    Family History  Problem Relation Age of Onset   Breast cancer Mother    Squamous cell carcinoma Mother        sinus   Transient ischemic attack Mother    Cancer Father        prostate   Hypertension Father    Diabetes Father    Breast cancer Sister    Stroke Maternal Grandmother    Breast cancer Paternal Grandmother    Cancer Other        grandmother, breast cancer   Colon cancer Neg Hx    Coronary artery disease Neg Hx    Colon polyps Neg Hx    Esophageal cancer Neg Hx    Rectal cancer Neg Hx    Stomach cancer Neg Hx     Social History   Socioeconomic History   Marital status: Single    Spouse name: Not on file   Number of children: Not on file   Years of education: Not on file   Highest education level: Some college, no degree  Occupational History   Occupation: self employed    Comment: airline pilot  Tobacco Use  Smoking status: Former    Current packs/day: 0.00    Types: Cigarettes    Quit date: 09/15/1984    Years since quitting: 39.3   Smokeless tobacco: Never  Vaping Use   Vaping status: Never Used  Substance and Sexual Activity   Alcohol use: Yes    Alcohol/week: 2.0 standard drinks of alcohol    Types: 2 Cans of beer per week   Drug use: No   Sexual activity: Never  Other Topics Concern   Not on file  Social History Narrative   SingleWork; it consultant, has a dog walking businessExercise-- swim, walking dogs and bike   Retired from museum/gallery conservator   Social Drivers of Corporate Investment Banker Strain: Low Risk  (12/29/2023)   Overall Financial Resource Strain (CARDIA)    Difficulty of Paying Living Expenses: Not hard at all  Food Insecurity: No Food Insecurity (12/29/2023)    Hunger Vital Sign    Worried About Running Out of Food in the Last Year: Never true    Ran Out of Food in the Last Year: Never true  Transportation Needs: No Transportation Needs (12/29/2023)   PRAPARE - Administrator, Civil Service (Medical): No    Lack of Transportation (Non-Medical): No  Physical Activity: Sufficiently Active (12/29/2023)   Exercise Vital Sign    Days of Exercise per Week: 7 days    Minutes of Exercise per Session: 70 min  Stress: Stress Concern Present (12/29/2023)   [employer] of Occupational Health - Occupational Stress Questionnaire    Feeling of Stress: Rather much  Social Connections: Unknown (12/29/2023)   Social Connection and Isolation Panel    Frequency of Communication with Friends and Family: More than three times a week    Frequency of Social Gatherings with Friends and Family: Twice a week    Attends Religious Services: Patient declined    Database Administrator or Organizations: Patient declined    Attends Engineer, Structural: Not on file    Marital Status: Never married  Intimate Partner Violence: Not on file    Outpatient Medications Prior to Visit  Medication Sig Dispense Refill   ALPRAZolam  (XANAX ) 0.5 MG tablet Take 1 tablet (0.5 mg total) by mouth at bedtime as needed. 30 tablet 0   Ascorbic Acid (VITAMIN C) 1000 MG tablet Take 1,000 mg by mouth daily.     Calcium  Carbonate-Vitamin D  (CALTRATE 600+D) 600-400 MG-UNIT per tablet Take 1 tablet by mouth 2 (two) times daily.     FLUoxetine  (PROZAC ) 40 MG capsule Take 40 mg by mouth 2 (two) times daily.     levothyroxine  (SYNTHROID ) 100 MCG tablet Take 1 tablet (100 mcg total) by mouth daily before breakfast. Needs appt 30 tablet 0   Multiple Vitamin (MULTIVITAMIN) tablet Take 1 tablet by mouth daily.     vitamin E 400 UNIT capsule Take 400 Units by mouth daily.     No facility-administered medications prior to visit.    Allergies  Allergen Reactions   Compazine  [Prochlorperazine Edisylate]     bizarre, dystonic   Metoclopramide Hcl Other (See Comments)    Dystonia     Review of Systems  Constitutional:  Negative for fever and malaise/fatigue.  HENT:  Negative for congestion.   Eyes:  Negative for blurred vision.  Respiratory:  Negative for shortness of breath.   Cardiovascular:  Negative for chest pain, palpitations and leg swelling.  Gastrointestinal:  Negative for abdominal pain, blood in stool  and nausea.  Genitourinary:  Negative for dysuria and frequency.  Musculoskeletal:  Negative for falls.  Skin:  Negative for rash.  Neurological:  Negative for dizziness, loss of consciousness and headaches.  Endo/Heme/Allergies:  Negative for environmental allergies.  Psychiatric/Behavioral:  Negative for depression. The patient is not nervous/anxious.        Objective:    Physical Exam Vitals and nursing note reviewed.  Constitutional:      General: She is not in acute distress.    Appearance: Normal appearance. She is well-developed.  HENT:     Head: Normocephalic and atraumatic.  Eyes:     General: No scleral icterus.       Right eye: No discharge.        Left eye: No discharge.  Cardiovascular:     Rate and Rhythm: Normal rate and regular rhythm.     Heart sounds: No murmur heard. Pulmonary:     Effort: Pulmonary effort is normal. No respiratory distress.     Breath sounds: Normal breath sounds.  Musculoskeletal:        General: Normal range of motion.     Cervical back: Normal range of motion and neck supple.     Right lower leg: No edema.     Left lower leg: No edema.  Skin:    General: Skin is warm and dry.  Neurological:     Mental Status: She is alert and oriented to person, place, and time.  Psychiatric:        Mood and Affect: Mood normal.        Behavior: Behavior normal.        Thought Content: Thought content normal.        Judgment: Judgment normal.     BP 118/70 (BP Location: Left Arm, Patient Position:  Sitting, Cuff Size: Normal)   Pulse 64   Temp 97.9 F (36.6 C) (Oral)   Resp 16   Ht 5' 7 (1.702 m)   Wt 113 lb 6.4 oz (51.4 kg)   SpO2 95%   BMI 17.76 kg/m  Wt Readings from Last 3 Encounters:  01/01/24 113 lb 6.4 oz (51.4 kg)  11/29/23 110 lb (49.9 kg)  05/08/23 109 lb 3.2 oz (49.5 kg)    Diabetic Foot Exam - Simple   No data filed    Lab Results  Component Value Date   WBC 4.9 03/30/2023   HGB 12.4 03/30/2023   HCT 36.9 03/30/2023   PLT 168.0 03/30/2023   GLUCOSE 78 03/30/2023   CHOL 173 05/05/2022   TRIG 76.0 05/05/2022   HDL 74.80 05/05/2022   LDLCALC 83 05/05/2022   ALT 37 (H) 03/30/2023   AST 28 03/30/2023   NA 138 03/30/2023   K 4.0 03/30/2023   CL 100 03/30/2023   CREATININE 0.51 03/30/2023   BUN 14 03/30/2023   CO2 31 03/30/2023   TSH 2.57 01/01/2024   INR 1.2 01/02/2006    Lab Results  Component Value Date   TSH 2.57 01/01/2024   Lab Results  Component Value Date   WBC 4.9 03/30/2023   HGB 12.4 03/30/2023   HCT 36.9 03/30/2023   MCV 96.6 03/30/2023   PLT 168.0 03/30/2023   Lab Results  Component Value Date   NA 138 03/30/2023   K 4.0 03/30/2023   CO2 31 03/30/2023   GLUCOSE 78 03/30/2023   BUN 14 03/30/2023   CREATININE 0.51 03/30/2023   BILITOT 0.5 03/30/2023   ALKPHOS 54 03/30/2023  AST 28 03/30/2023   ALT 37 (H) 03/30/2023   PROT 6.2 03/30/2023   ALBUMIN 4.1 03/30/2023   CALCIUM  9.3 03/30/2023   ANIONGAP 6 03/08/2014   GFR 98.06 03/30/2023   Lab Results  Component Value Date   CHOL 173 05/05/2022   Lab Results  Component Value Date   HDL 74.80 05/05/2022   Lab Results  Component Value Date   LDLCALC 83 05/05/2022   Lab Results  Component Value Date   TRIG 76.0 05/05/2022   Lab Results  Component Value Date   CHOLHDL 2 05/05/2022   No results found for: HGBA1C     Assessment & Plan:  Need for influenza vaccination -     Flu vaccine HIGH DOSE PF(Fluzone Trivalent)  Hypothyroidism, unspecified type -      Thyroid  Panel With TSH  Assessment and Plan Assessment & Plan Hypothyroidism   Thyroid  function is well-managed with no current symptoms or concerns. Ordered thyroid  function tests.  Immunization   Discussed flu vaccination and COVID-19 booster. She is eligible for a flu shot and advised to wait at least three months post-COVID infection before considering a COVID-19 booster. Administered flu shot.  Major depressive disorder and anxiety disorder   Continues on Prozac  and alprazolam  with no new symptoms or concerns. Continue current medications: Prozac  and alprazolam .    Dwyane Dupree R Lowne Chase, DO

## 2024-01-23 ENCOUNTER — Other Ambulatory Visit: Payer: Self-pay | Admitting: Family Medicine

## 2024-01-23 DIAGNOSIS — E039 Hypothyroidism, unspecified: Secondary | ICD-10-CM
